# Patient Record
Sex: Male | Born: 1950
Health system: Southern US, Community
[De-identification: ages and names within clinical notes are randomized; demographics above are authoritative.]

## PROBLEM LIST (undated history)

## (undated) DIAGNOSIS — K219 Gastro-esophageal reflux disease without esophagitis: Secondary | ICD-10-CM

## (undated) DIAGNOSIS — I2510 Atherosclerotic heart disease of native coronary artery without angina pectoris: Secondary | ICD-10-CM

## (undated) DIAGNOSIS — I517 Cardiomegaly: Secondary | ICD-10-CM

## (undated) DIAGNOSIS — I5020 Unspecified systolic (congestive) heart failure: Secondary | ICD-10-CM

## (undated) DIAGNOSIS — J810 Acute pulmonary edema: Secondary | ICD-10-CM

## (undated) DIAGNOSIS — I10 Essential (primary) hypertension: Secondary | ICD-10-CM

## (undated) HISTORY — DX: Gastro-esophageal reflux disease without esophagitis: K21.9

## (undated) HISTORY — PX: CHOLECYSTECTOMY: SHX55

## (undated) HISTORY — DX: Unspecified systolic (congestive) heart failure: I50.20

## (undated) HISTORY — DX: Atherosclerotic heart disease of native coronary artery without angina pectoris: I25.10

## (undated) HISTORY — PX: DENTAL SURGERY: SHX609

## (undated) HISTORY — DX: Cardiomegaly: I51.7

## (undated) HISTORY — DX: Acute pulmonary edema: J81.0

---

## 2001-01-01 ENCOUNTER — Emergency Department (HOSPITAL_COMMUNITY): Admission: EM | Admit: 2001-01-01 | Discharge: 2001-01-01 | Payer: Self-pay | Admitting: Emergency Medicine

## 2001-09-18 ENCOUNTER — Emergency Department (HOSPITAL_COMMUNITY): Admission: EM | Admit: 2001-09-18 | Discharge: 2001-09-18 | Payer: Self-pay | Admitting: Emergency Medicine

## 2004-05-07 HISTORY — PX: CHOLECYSTECTOMY, LAPAROSCOPIC: SHX56

## 2004-10-15 ENCOUNTER — Emergency Department (HOSPITAL_COMMUNITY): Admission: EM | Admit: 2004-10-15 | Discharge: 2004-10-15 | Payer: Self-pay | Admitting: Emergency Medicine

## 2005-01-05 ENCOUNTER — Ambulatory Visit (HOSPITAL_COMMUNITY): Admission: RE | Admit: 2005-01-05 | Discharge: 2005-01-06 | Payer: Self-pay | Admitting: General Surgery

## 2005-01-05 ENCOUNTER — Encounter (INDEPENDENT_AMBULATORY_CARE_PROVIDER_SITE_OTHER): Payer: Self-pay | Admitting: Specialist

## 2007-08-05 ENCOUNTER — Emergency Department (HOSPITAL_COMMUNITY): Admission: EM | Admit: 2007-08-05 | Discharge: 2007-08-05 | Payer: Self-pay | Admitting: Emergency Medicine

## 2007-08-29 ENCOUNTER — Emergency Department (HOSPITAL_COMMUNITY): Admission: EM | Admit: 2007-08-29 | Discharge: 2007-08-29 | Payer: Self-pay | Admitting: Emergency Medicine

## 2010-09-22 NOTE — Op Note (Signed)
NAMESOURISH, ALLENDER            ACCOUNT NO.:  [id number]   MEDICAL RECORD NO.:  [id number]          PATIENT TYPE:  AMB   LOCATION:  DAY                          FACILITY:  [facility]   PHYSICIAN:  Sharlet Salina T. Hoxworth, M.D.DATE OF BIRTH:  May 17, 1950   DATE OF PROCEDURE:  01/05/2005  DATE OF DISCHARGE:                                 OPERATIVE REPORT   PREOPERATIVE DIAGNOSIS:  Cholelithiasis and cholecystitis.   POSTOPERATIVE DIAGNOSIS:  Cholelithiasis and cholecystitis.   PROCEDURE:  Laparoscopic cholecystectomy with intraoperative cholangiogram.   SURGEON:  Sharlet Salina T. Hoxworth, M.D.   ASSISTANT:  Leonie Man, M.D.   ANESTHESIA:  General.   BRIEF HISTORY:  Mr. Kinney is a 60 year old male who presents with  repeated episodes of epigastric abdominal pain. He has had a workup  including a gallbladder ultrasound showing multiple gallstones with a  thickened gallbladder wall. He is felt to have significant cholecystitis and  cholelithiasis and laparoscopic cholecystectomy with cholangiogram has been  recommended and accepted. The nature of the procedure, indications, risks of  bleeding, infection, bile leak and bile duct injury were discussed and  understood preoperatively. He is now brought to the operating room for this  procedure.   DESCRIPTION OF PROCEDURE:  The patient was brought to the operating room,  placed in supine position on the operating table and general endotracheal  anesthesia was induced. The abdomen was widely sterilely prepped and draped.  PAS were in place. He received preoperative antibiotics. Correct patient and  procedure were verified. Local anesthesia was used to infiltrate the trocar  sites. Through a 1 cm umbilical incision, the fascia was divided for 1 cm,  the peritoneum entered under direct vision and through a mattress suture of  #0 Vicryl, the Hasson trocar placed and pneumoperitoneum established. The  gallbladder was visualized and was very  thickened chronically and somewhat  subacutely inflamed. Under direct vision, a 10 mm trocar was placed in the  subxiphoid area and two 5 mm trocars along the right subcostal margin. The  gallbladder was grasped and elevated over the liver. Some filmy adhesions of  duodenum and omentum were taken down. The fundus was very thick but was able  to be grasped and retracted laterally and fibrofatty tissue stripped down  off the neck of the gallbladder toward the porta hepatis. The cystic artery  was clearly identified coursing up on the gallbladder wall and was divided  between two proximal and one distal clip. The distal gallbladder was  thoroughly dissected and the cystic duct identified and dissected  circumferentially over about a centimeter and a half. The gallbladder was  then clipped at the junction with the cystic duct and operative  cholangiogram obtained through the cystic duct. This showed good filling of  a normal common bile duct and intrahepatic ducts with free flow into the  duodenum and no filling defects. Following this, the cystic duct was triply  clipped and divided. The gallbladder was then dissected free from its bed  using hook cautery. There was a significant chronic inflammation and the  gallbladder was opened and multiple stones were extracted. Dissection was  tedious but the gallbladder was eventually detached from the liver, placed  in an EndoCatch bag and brought out through the umbilicus. The gallbladder  bed was cauterized and hemostasis obtained. Due to the severe chronic  inflammation, I left a closed suction drain in the subphrenic space. Trocars  removed, all CO2 evacuated and  the mattress sutures secured at the umbilicus. The skin incisions were  closed with interrupted subcuticular Monocryl and Steri-Strips. Sponge,  needle and instrument counts were correct. Dry sterile dressings were  applied and the patient taken to recovery in good condition.       Lorne Skeens. Hoxworth, M.D.  Electronically Signed     BTH/MEDQ  D:  01/05/2005  T:  01/05/2005  Job:  [id number]

## 2011-01-30 LAB — POCT I-STAT, CHEM 8
BUN: 17
Calcium, Ion: 1.17
Chloride: 103
Glucose, Bld: 87
HCT: 40
TCO2: 26

## 2011-01-30 LAB — CBC
HCT: 40.1
Platelets: 235
RBC: 5.01
RDW: 14.4
WBC: 4.8

## 2011-01-30 LAB — URINALYSIS, ROUTINE W REFLEX MICROSCOPIC
Bilirubin Urine: NEGATIVE
Hgb urine dipstick: NEGATIVE
Ketones, ur: NEGATIVE
Nitrite: NEGATIVE
Protein, ur: 30 — AB
Urobilinogen, UA: 2 — ABNORMAL HIGH

## 2011-01-30 LAB — DIFFERENTIAL
Eosinophils Absolute: 0
Monocytes Absolute: 0.5

## 2011-01-30 LAB — POCT CARDIAC MARKERS
Myoglobin, poc: 209
Operator id: 288831

## 2011-10-04 ENCOUNTER — Emergency Department (HOSPITAL_COMMUNITY): Payer: Medicaid Other

## 2011-10-04 ENCOUNTER — Encounter (HOSPITAL_COMMUNITY): Payer: Self-pay | Admitting: Emergency Medicine

## 2011-10-04 ENCOUNTER — Inpatient Hospital Stay (HOSPITAL_COMMUNITY)
Admission: AD | Admit: 2011-10-04 | Discharge: 2011-10-09 | DRG: 287 | Disposition: A | Discharge status: Home / Self Care | Payer: Medicaid Other | Attending: Cardiovascular Disease | Admitting: Cardiovascular Disease

## 2011-10-04 DIAGNOSIS — I214 Non-ST elevation (NSTEMI) myocardial infarction: Secondary | ICD-10-CM

## 2011-10-04 DIAGNOSIS — I517 Cardiomegaly: Secondary | ICD-10-CM

## 2011-10-04 DIAGNOSIS — R110 Nausea: Secondary | ICD-10-CM

## 2011-10-04 DIAGNOSIS — Z823 Family history of stroke: Secondary | ICD-10-CM

## 2011-10-04 DIAGNOSIS — J810 Acute pulmonary edema: Secondary | ICD-10-CM

## 2011-10-04 DIAGNOSIS — E119 Type 2 diabetes mellitus without complications: Secondary | ICD-10-CM | POA: Diagnosis present

## 2011-10-04 DIAGNOSIS — Z87891 Personal history of nicotine dependence: Secondary | ICD-10-CM

## 2011-10-04 DIAGNOSIS — I428 Other cardiomyopathies: Secondary | ICD-10-CM | POA: Diagnosis present

## 2011-10-04 DIAGNOSIS — Z91199 Patient's noncompliance with other medical treatment and regimen due to unspecified reason: Secondary | ICD-10-CM

## 2011-10-04 DIAGNOSIS — K219 Gastro-esophageal reflux disease without esophagitis: Secondary | ICD-10-CM | POA: Diagnosis present

## 2011-10-04 DIAGNOSIS — I169 Hypertensive crisis, unspecified: Secondary | ICD-10-CM

## 2011-10-04 DIAGNOSIS — I10 Essential (primary) hypertension: Secondary | ICD-10-CM | POA: Diagnosis present

## 2011-10-04 DIAGNOSIS — I429 Cardiomyopathy, unspecified: Secondary | ICD-10-CM | POA: Diagnosis present

## 2011-10-04 DIAGNOSIS — R072 Precordial pain: Secondary | ICD-10-CM

## 2011-10-04 DIAGNOSIS — Z8249 Family history of ischemic heart disease and other diseases of the circulatory system: Secondary | ICD-10-CM

## 2011-10-04 DIAGNOSIS — I509 Heart failure, unspecified: Principal | ICD-10-CM | POA: Diagnosis present

## 2011-10-04 DIAGNOSIS — R079 Chest pain, unspecified: Secondary | ICD-10-CM | POA: Diagnosis present

## 2011-10-04 DIAGNOSIS — Z9089 Acquired absence of other organs: Secondary | ICD-10-CM

## 2011-10-04 DIAGNOSIS — Z9119 Patient's noncompliance with other medical treatment and regimen: Secondary | ICD-10-CM

## 2011-10-04 DIAGNOSIS — Z841 Family history of disorders of kidney and ureter: Secondary | ICD-10-CM

## 2011-10-04 HISTORY — DX: Acute pulmonary edema: J81.0

## 2011-10-04 HISTORY — DX: Essential (primary) hypertension: I10

## 2011-10-04 HISTORY — DX: Gastro-esophageal reflux disease without esophagitis: K21.9

## 2011-10-04 HISTORY — DX: Cardiomegaly: I51.7

## 2011-10-04 LAB — CARDIAC PANEL(CRET KIN+CKTOT+MB+TROPI)
CK, MB: 6.8 ng/mL (ref 0.3–4.0)
CK, MB: 5.8 ng/mL — ABNORMAL HIGH (ref 0.3–4.0)
Relative Index: 2.8 — ABNORMAL HIGH (ref 0.0–2.5)
Relative Index: 2.7 — ABNORMAL HIGH (ref 0.0–2.5)
Total CK: 243 U/L — ABNORMAL HIGH (ref 7–232)
Total CK: 217 U/L (ref 7–232)
Troponin I: <0.3 ng/mL (ref <0.30)
Troponin I: <0.3 ng/mL (ref <0.30)

## 2011-10-04 LAB — HEPATIC FUNCTION PANEL
ALT: 31 U/L (ref 0–53)
AST: 43 U/L — ABNORMAL HIGH (ref 0–37)
Total Protein: 7.3 g/dL (ref 6.0–8.3)

## 2011-10-04 LAB — POCT I-STAT TROPONIN I

## 2011-10-04 LAB — CBC
MCH: 26.1 pg (ref 26.0–34.0)
MCHC: 34.2 g/dL (ref 30.0–36.0)
MCV: 76.5 fL — ABNORMAL LOW (ref 78.0–100.0)
Platelets: 225 10*3/uL (ref 150–400)
WBC: 5.9 10*3/uL (ref 4.0–10.5)

## 2011-10-04 LAB — COMPREHENSIVE METABOLIC PANEL
ALT: 30 U/L (ref 0–53)
AST: 42 U/L — ABNORMAL HIGH (ref 0–37)
CO2: 26 mEq/L (ref 19–32)
Calcium: 9.1 mg/dL (ref 8.4–10.5)
Chloride: 100 mEq/L (ref 96–112)
Creatinine, Ser: 1.17 mg/dL (ref 0.50–1.35)
Glucose, Bld: 111 mg/dL — ABNORMAL HIGH (ref 70–99)
Total Protein: 7.6 g/dL (ref 6.0–8.3)

## 2011-10-04 MED ORDER — FUROSEMIDE 10 MG/ML IJ SOLN
80.0000 mg | Freq: Once | INTRAMUSCULAR | Status: AC
  Administered 2011-10-04: 80 mg via INTRAVENOUS
  Filled 2011-10-04: qty 8

## 2011-10-04 MED ORDER — PANTOPRAZOLE SODIUM 40 MG PO TBEC
DELAYED_RELEASE_TABLET | ORAL | Status: AC
  Filled 2011-10-04: qty 1

## 2011-10-04 MED ORDER — MORPHINE SULFATE 2 MG/ML IJ SOLN: 1.0000 mg | INTRAMUSCULAR | Status: DC | PRN

## 2011-10-04 MED ORDER — ASPIRIN EC 81 MG PO TBEC
81.0000 mg | DELAYED_RELEASE_TABLET | Freq: Every day | ORAL | Status: DC
  Administered 2011-10-05: 81 mg via ORAL
  Filled 2011-10-04: qty 1

## 2011-10-04 MED ORDER — SODIUM CHLORIDE 0.9 % IJ SOLN
3.0000 mL | Freq: Two times a day (BID) | INTRAMUSCULAR | Status: DC
  Administered 2011-10-04 – 2011-10-09 (×10): 3 mL via INTRAVENOUS

## 2011-10-04 MED ORDER — ALPRAZOLAM 0.25 MG PO TABS: 0.2500 mg | ORAL_TABLET | Freq: Three times a day (TID) | ORAL | Status: DC | PRN

## 2011-10-04 MED ORDER — LISINOPRIL 20 MG PO TABS
20.0000 mg | ORAL_TABLET | Freq: Every day | ORAL | Status: DC
  Administered 2011-10-04 – 2011-10-07 (×4): 20 mg via ORAL
  Filled 2011-10-04 (×4): qty 1

## 2011-10-04 MED ORDER — ONDANSETRON HCL 4 MG/2ML IJ SOLN
INTRAMUSCULAR | Status: AC
  Administered 2011-10-04: 4 mg via INTRAVENOUS
  Filled 2011-10-04: qty 2

## 2011-10-04 MED ORDER — ZOLPIDEM TARTRATE 5 MG PO TABS: 10.0000 mg | ORAL_TABLET | Freq: Every evening | ORAL | Status: DC | PRN

## 2011-10-04 MED ORDER — HEPARIN SODIUM (PORCINE) 5000 UNIT/ML IJ SOLN
INTRAMUSCULAR | Status: AC
  Administered 2011-10-04: 5000 [IU] via SUBCUTANEOUS
  Filled 2011-10-04: qty 1

## 2011-10-04 MED ORDER — FUROSEMIDE 40 MG PO TABS
40.0000 mg | ORAL_TABLET | Freq: Two times a day (BID) | ORAL | Status: DC
  Administered 2011-10-04 – 2011-10-09 (×10): 40 mg via ORAL
  Filled 2011-10-04 (×2): qty 1
  Filled 2011-10-04 (×4): qty 2
  Filled 2011-10-04 (×3): qty 1
  Filled 2011-10-04 (×2): qty 2
  Filled 2011-10-04: qty 1

## 2011-10-04 MED ORDER — ASPIRIN 81 MG PO CHEW
324.0000 mg | CHEWABLE_TABLET | Freq: Once | ORAL | Status: AC
  Administered 2011-10-04: 324 mg via ORAL
  Filled 2011-10-04: qty 4

## 2011-10-04 MED ORDER — ACETAMINOPHEN 325 MG PO TABS: 650.0000 mg | ORAL_TABLET | Freq: Four times a day (QID) | ORAL | Status: DC | PRN

## 2011-10-04 MED ORDER — NITROGLYCERIN IN D5W 200-5 MCG/ML-% IV SOLN
5.0000 ug/min | INTRAVENOUS | Status: DC
  Administered 2011-10-04: 5 ug/min via INTRAVENOUS
  Administered 2011-10-05: 80 ug/min via INTRAVENOUS
  Filled 2011-10-04 (×2): qty 250

## 2011-10-04 MED ORDER — CLONIDINE HCL 0.1 MG PO TABS
0.1000 mg | ORAL_TABLET | Freq: Once | ORAL | Status: AC
  Administered 2011-10-05: 0.1 mg via ORAL
  Filled 2011-10-04: qty 1

## 2011-10-04 MED ORDER — HYDRALAZINE HCL 20 MG/ML IJ SOLN
10.0000 mg | INTRAMUSCULAR | Status: DC | PRN
  Filled 2011-10-04: qty 0.5

## 2011-10-04 MED ORDER — TRAMADOL HCL 50 MG PO TABS
50.0000 mg | ORAL_TABLET | Freq: Four times a day (QID) | ORAL | Status: DC | PRN
  Administered 2011-10-05: 50 mg via ORAL
  Filled 2011-10-04: qty 1

## 2011-10-04 MED ORDER — SODIUM CHLORIDE 0.9 % IV SOLN
INTRAVENOUS | Status: DC
  Administered 2011-10-04: 10 mL/h via INTRAVENOUS

## 2011-10-04 MED ORDER — ONDANSETRON HCL 4 MG/2ML IJ SOLN
INTRAMUSCULAR | Status: AC
  Administered 2011-10-04: 05:00:00 via INTRAVENOUS
  Filled 2011-10-04: qty 2

## 2011-10-04 MED ORDER — HEPARIN SODIUM (PORCINE) 5000 UNIT/ML IJ SOLN
5000.0000 [IU] | Freq: Three times a day (TID) | INTRAMUSCULAR | Status: DC
  Administered 2011-10-04 – 2011-10-09 (×16): 5000 [IU] via SUBCUTANEOUS
  Filled 2011-10-04 (×18): qty 1

## 2011-10-04 MED ORDER — PANTOPRAZOLE SODIUM 40 MG PO TBEC
40.0000 mg | DELAYED_RELEASE_TABLET | Freq: Two times a day (BID) | ORAL | Status: DC
  Administered 2011-10-04 – 2011-10-09 (×12): 40 mg via ORAL
  Filled 2011-10-04 (×11): qty 1

## 2011-10-04 MED ORDER — METOPROLOL TARTRATE 12.5 MG HALF TABLET
12.5000 mg | ORAL_TABLET | Freq: Two times a day (BID) | ORAL | Status: DC
  Administered 2011-10-04 – 2011-10-05 (×4): 12.5 mg via ORAL
  Filled 2011-10-04 (×6): qty 1

## 2011-10-04 MED ORDER — POTASSIUM CHLORIDE CRYS ER 20 MEQ PO TBCR
40.0000 meq | EXTENDED_RELEASE_TABLET | Freq: Once | ORAL | Status: AC
  Administered 2011-10-04: 40 meq via ORAL
  Filled 2011-10-04: qty 2

## 2011-10-04 NOTE — Care Management Note (Addendum)
    Page 1 of 1   10/05/2011     10:08:34 AM   CARE MANAGEMENT NOTE 10/05/2011  Patient:  Thomas Bullock, Thomas Bullock   Account Number:  [id number]  Date Initiated:  10/04/2011  Documentation initiated by:  Junius Creamer  Subjective/Objective Assessment:   adm w htn     Action/Plan:   lives w friend, no ins listed, pcp dr Gaspar Garbe little   Anticipated DC Date:  10/06/2011   Anticipated DC Plan:  HOME/SELF CARE  In-house referral  Financial Counselor      DC Planning Services  CM consult  [facility]igent [facility]      Choice offered to / List presented to:             Status of service:   Medicare Important Message given?   (If response is "NO", the following Medicare IM given date fields will be blank) Date Medicare IM given:   Date Additional Medicare IM given:    Discharge Disposition:    Per UR Regulation:  Reviewed for med. necessity/level of care/duration of stay  If discussed at Long Length of Stay Meetings, dates discussed:    Comments:  5/31 10:00a debbie Jaydn Moscato rn,bsn spoke w pt. got contact number and explained that [facility]. [facility] will mail packet and pt should fill out and mail back then [facility] will call and sched appt. pt's contact is joann king [phone] or [phone].  5/30 13:30p debbie Maryclaire Stoecker rn,bsn [phone] will follow and assist as pt progresses.spoke w pt and sister. he does not have ins. gave pt inform on [facility]. gave pt community assist card. will ask md to put on as many $4.00 meds as possible, [facility] will see. will ck w internal med and fam pract clinics to see if either taking new pt's.

## 2011-10-04 NOTE — H&P (Signed)
History and Physical  Thomas Bullock ZOX:[id number] DOB: December 03, 1950 DOA: 10/04/2011  Referring physician: PCP: Judene Companion, MD, MD   Chief Complaint: SOB  HPI:  61 yr old AAM started to have some SOB last pm.  Has been having some increasing SOB actually from 5/28.  Has had CP as well and n/v-vomited about 3 x since Tuesday. Did eat but has had less appetite.  CP localized in the center of the chest-does have some radiating CP into arm and neck-but states that this is more of a muscular pain which he has had for a long time.  Does state he has felt a sour sensation in his abdomen with radiation from the epigastrium up to the throat, which resolved when he was given some medication here, was given Ziofran in the EMS truck which seemed to help  Prior to arrival at ED, Blood pressure was 210/170 Has not had these issues before.  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  + cough but no fever or chills-states has had some sputum as well.  No wheeze, no allergic h/o Last saw a MD maybe about 2008.  Non-compliant on medications 2/2 to cost-last time took medicine was 2009.   Patient is a relatively poor historian and it was somewhat difficult to determine the history, which was supplemented with Sister's/Duaghter's input      Chart Review:  Seen for syncope and Dental issues in the past, no other real PMHx from Epic  Review of Systems:  Occasional blurred and double vision, no headaches, some abd pain, seems to have been more abd than CP, no dizzyness, no lightheadedness, no dark stool or tarry stool, no vomiting blood, no wekaness like a stroke, but L shoulder weakness which is chronic, no diarrhoea, no suspicious food, no sicik contacts that he can really recall-thinks he got sick at his job in [employer]  Past Medical History  Diagnosis Date  . Hypertension     History reviewed. No pertinent past surgical history.  Social History:  reports that he has never smoked. He does not have any smokeless tobacco history on file. He reports that he does not drink alcohol. His drug history not on file.  No Known Allergies  History reviewed. No pertinent family history.   Prior to Admission medications   Medication Sig Start Date End Date Taking? Authorizing Provider  ibuprofen (ADVIL,MOTRIN) 200 MG tablet Take 400 mg by mouth every 6 (six) hours as needed. For pain   Yes Historical Provider, MD   Physical Exam: Filed Vitals:   10/04/11 0615 10/04/11 0630 10/04/11 0645 10/04/11 0700  BP: 197/149 209/139 186/139 194/147  Pulse: 106 98 97 101  Temp:      TempSrc:      Resp: 26 24 30 30   SpO2: 100% 100% 100% 100%     General:  Alert pleasant AAM in NAD  Eyes: no pallor, no ict, fundi inspected, but disc not visuallised  ENT: soft, no JVD, no bruit  Neck: no bruit  Cardiovascular: s1 s2 tachycardic, RRR  Respiratory: clinically clear, no added sound, no tvr, no tvf  Abdomen: soft, nt,nt, no HSM  Skin: no LE edema  Musculoskeletal: moves all 4 limbs equally  Psychiatric: euthymic  Neurologic: grossly intact  Labs on Admission:  Basic Metabolic Panel:  Lab 10/04/11 0542  NA 138  K 3.3*  CL 100  CO2 26  GLUCOSE 111*  BUN 17  CREATININE 1.17  CALCIUM 9.1  MG --  PHOS --    Liver Function Tests:  Lab 10/04/11 0542  AST 42*  ALT 30  ALKPHOS 110  BILITOT 0.7  PROT 7.6  ALBUMIN 3.6   No results found for this basename: LIPASE:5,AMYLASE:5 in the last 168 hours No results found for this basename: AMMONIA:5 in the last 168 hours  CBC:  Lab 10/04/11 0542  WBC 5.9  NEUTROABS --  HGB 13.7  HCT 40.1  MCV 76.5*  PLT 225    Cardiac Enzymes: No results found for this basename: CKTOTAL:5,CKMB:5,CKMBINDEX:5,TROPONINI:5 in the last 168  hours  Troponin [facility] Test)  [facility] 10/04/11 0635  TROPIPOC 0.06    BNP (last 3 results)  Basename 10/04/11 0542  PROBNP 5347.0*    CBG: No results found for this basename: GLUCAP:5 in the last 168 hours   Radiological Exams on Admission: Dg Chest Portable 1 View  10/04/2011  *RADIOLOGY REPORT*  Clinical Data: Shortness of breath, abdominal discomfort, hypertension.  Nausea.  PORTABLE CHEST - 1 VIEW  Comparison: 08/29/2007  Findings: Shallow inspiration.  Cardiac enlargement with mild pulmonary vascular congestion, new since the previous study.  No significant edema.  No focal consolidation.  No blunting of costophrenic angles.  No pneumothorax.  IMPRESSION: Interval development of cardiac enlargement with mild pulmonary vascular congestion.  Original Report Authenticated By: Marlon Pel, M.D.    EKG: Independently reviewed. sinuc tachycardia, PR=0.08, QRS axis=90 degrees, Diffuse ST -elevations in leads V2, v3,v4 with >49mm depressions in v5-6 with changed t wave morphology from 09/03/2007 ekg   Active Problems:  * No active hospital problems. *     Assessment/Plan 1. Accelerated HTN-Agree with Nitroglycerin Gtt for right now. We will also add to his medications when necessary metoprolol 12.5 twice a day and may titrate upwards. We'll also place and parameters for hydralazine with blood pressures over 200/100 necessitating 5-10 mg hydralazine. 2. EKG changes-patient has no known coronary artery disease and I have ordered a stat echocardiogram to determine if there is any systolic dysfunction that might be driving this elevation of his blood pressure-I would expect him to be hypotensive however without any overt cardiac issue. Patient will get dedicated cardiac enzymes done as well. Given that he has sinus tachycardia which may also cause some of these EKG changes, I have asked cardiology to see the patient and determine if he needs further ischemic workup. 3. ?  Reflux versus gastritis versus viral gastroenteritis-agree with continuing Zofran, will start a PPI as well. As he has no temperature or fever currently will hold off on any other workup. His WBC is normal 4. New decompensated CHF [unknown systolic or diastolic]-we'll get an echo. She had a proBNP of 5000 and chest x-ray findings of cardiac enlargement pulmonary vascular congestion. Patient was given one dose of IV Lasix 80 mg stat and this will be continued twice daily. Strict ins and outs. Foley catheter placed.  PAtient has a new oxygen requirement probably from the CHF and will keep on O2 5. ? New onset diabetes mellitus-we will get an A1c to determine the case-Sliding scale orders on hold till we get consecutve blood sugars to determine if needed. 6. CKD STAGE I-B. and creatinine 17/1.17-this seems to be his baseline. Will hold nephrotoxins as much as possible and cautiously diuresis 7. Noncompliance with medication-patient requesting  to see a Child psychotherapist. Explained to him that most medications are on the forearm wrist and he probably will benefit from having his pharmacy help him with the same  Code Status: Full Family Communication: Spoke with his sister who is health care power of attorney and daughter at bedside Disposition Plan: Inpatient-admit to [facility]. I've consulted [facility] who probably will take over his case given majority of his issues of cardiac-much appreciated.  Pleas Koch, MD  [facility] Pager [phone]  If 8PM-8AM, please contact floor/night-coverage at [email or web address], password [facility] 10/04/2011, 7:20 AM

## 2011-10-04 NOTE — ED Notes (Signed)
Paged and spoke with admitting MD Dr. Mahala Menghini and Cardiology PA, Franky Macho to inform of CKMB 6.8.

## 2011-10-04 NOTE — ED Notes (Signed)
Attempt to call report, floor unable to take at this time.  

## 2011-10-04 NOTE — ED Notes (Signed)
Patient feeling nauseated.  MD notified.  New orders per Dr Dierdre Highman.

## 2011-10-04 NOTE — Progress Notes (Signed)
Echo shows severe global LVD. Will add ACE. He will need cath, will discuss timing with MD.  Corine Shelter PA-C 10/04/2011 4:08 PM

## 2011-10-04 NOTE — ED Notes (Signed)
Pt provided urinal and sprite. Belongings placed in bag at bedside. Pt holding on to wallet at this time.

## 2011-10-04 NOTE — ED Notes (Addendum)
Echo at bedside at this time. Sister Thomas Bullock [phone]. Thomas Bullock [facility]) [phone].

## 2011-10-04 NOTE — ED Notes (Signed)
Patient with increasing shortness of breath over last week.  Patient with rales bilat, congestion.  Patient denies any chest pain.  Patient states that he has not been taking his HTN meds for two years.

## 2011-10-04 NOTE — ED Notes (Addendum)
Patient with nausea, vomiting, epigastric pains and shortness of breath, on and off for one week.  Patient also having bilat shoulder pains with this and shortness of breath when lying down.  Patient from home via EMS.  EMS gave 4mg  of Zofran enroute to ED.

## 2011-10-04 NOTE — Consult Note (Addendum)
Reason for Consult: SOB, chest pain  Requesting Physician: Claybon Jabs  HPI: This is a 61 y.o. male with a past medical history significant for HTN. He had seen Dr Clarene Duke in 2009. No records are available. 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 He now works at [employer]" in [employer] but has no insurance.  He basically has been without medicines since 2009.  He has not been admitted or had any surgeries since we last saw him. He presented to the ER today with complaints of SOB, orthopnea, and epigastric chest pain that started last night . In the ER his B/P was over 200 systolic. His CXR shows CHF and his BNP is 5347. His EKG shows NSR and new LVH with NSST changes compared with 2009. He has has some improvement with IV NTG and IV Lasix. He is on SQ Heparin.  PMHx:  Past Medical History  Diagnosis Date  . Hypertension    Past Surgical History  Procedure Date  . Dental surgery   . Cholecystectomy, laparoscopic 2006    FAMHx: Family History  Problem Relation Age of Onset  . Kidney disease Mother     54  . Heart attack Father     2007-died of Heart attack @ age 16  . Stroke Mother     SOCHx:  reports that he quit smoking about 38 years ago. His smoking use included Cigarettes. He does not have any smokeless tobacco history on file. He reports that he drinks about 2 ounces of alcohol per week. His drug history not on file.  ALLERGIES: No Known Allergies  ROS: A comprehensive review of systems was negative. No GI bleeding. No syncope.  HOME MEDICATIONS: Advil  HOSPITAL MEDICATIONS: I have reviewed the patient's current medications.  VITALS: Blood pressure 183/130, pulse 82, temperature 98.1 F (36.7 C), temperature source Oral, resp. rate 18, SpO2 96.00%.  PHYSICAL EXAM: General appearance: alert, cooperative, appears stated age and no distress Neck: no carotid bruit, no JVD, supple, symmetrical, trachea  midline and thyroid not enlarged, symmetric, no tenderness/mass/nodules Lungs: bi basilar rales 1/3 way up Heart: regular rate and rhythm, no obvious murmur, +S4 Abdomen: soft, non-tender; bowel sounds normal; no masses,  no organomegaly Extremities: 1+ pre tibial pitting edema Pulses: 2+ and symmetric Skin: Skin color, texture, turgor normal. No rashes or lesions Neurologic: Grossly normal  LABS: Results for orders placed during the hospital encounter of 10/04/11 (from the past 48 hour(s))  CBC     Status: Abnormal   Collection Time   10/04/11  5:42 AM      Component Value Range Comment   WBC 5.9  4.0 - 10.5 (K/uL)    RBC 5.24  4.22 - 5.81 (MIL/uL)    Hemoglobin 13.7  13.0 - 17.0 (g/dL)    HCT 16.1  09.6 - 04.5 (%)    MCV 76.5 (*) 78.0 - 100.0 (fL)    MCH 26.1  26.0 - 34.0 (pg)    MCHC 34.2  30.0 - 36.0 (g/dL)    RDW 40.9  81.1 - 91.4 (%)    Platelets 225  150 - 400 (K/uL)   COMPREHENSIVE METABOLIC PANEL     Status: Abnormal   Collection Time   10/04/11  5:42 AM      Component Value Range Comment   Sodium 138  135 - 145 (mEq/L)    Potassium 3.3 (*) 3.5 - 5.1 (mEq/L)    Chloride  100  96 - 112 (mEq/L)    CO2 26  19 - 32 (mEq/L)    Glucose, Bld 111 (*) 70 - 99 (mg/dL)    BUN 17  6 - 23 (mg/dL)    Creatinine, Ser 4.09  0.50 - 1.35 (mg/dL)    Calcium 9.1  8.4 - 10.5 (mg/dL)    Total Protein 7.6  6.0 - 8.3 (g/dL)    Albumin 3.6  3.5 - 5.2 (g/dL)    AST 42 (*) 0 - 37 (U/L)    ALT 30  0 - 53 (U/L)    Alkaline Phosphatase 110  39 - 117 (U/L)    Total Bilirubin 0.7  0.3 - 1.2 (mg/dL)    GFR calc non Af Amer 66 (*) >90 (mL/min)    GFR calc Af Amer 77 (*) >90 (mL/min)   PRO B NATRIURETIC PEPTIDE     Status: Abnormal   Collection Time   10/04/11  5:42 AM      Component Value Range Comment   Pro B Natriuretic peptide (BNP) 5347.0 (*) 0 - 125 (pg/mL)   POCT I-STAT TROPONIN I     Status: Normal   Collection Time   10/04/11  6:35 AM      Component Value Range Comment   Troponin i,  poc 0.06  0.00 - 0.08 (ng/mL)    Comment 3              IMAGING: Dg Chest Portable 1 View  10/04/2011  *RADIOLOGY REPORT*  Clinical Data: Shortness of breath, abdominal discomfort, hypertension.  Nausea.  PORTABLE CHEST - 1 VIEW  Comparison: 08/29/2007  Findings: Shallow inspiration.  Cardiac enlargement with mild pulmonary vascular congestion, new since the previous study.  No significant edema.  No focal consolidation.  No blunting of costophrenic angles.  No pneumothorax.  IMPRESSION: Interval development of cardiac enlargement with mild pulmonary vascular congestion.  Original Report Authenticated By: Marlon Pel, M.D.    IMPRESSION: Principal Problem:  *Acute pulmonary edema Active Problems:  HTN (hypertension), malignant  Non compliance with medical treatment because of financial issues  Chest pain, r/o MI  LVH (left ventricular hypertrophy)  Gastroesophageal reflux symptoms   RECOMMENDATION: Agree with current work up and medications including SQ Heparin vs IV in the setting of uncontrolled HTN.  We will be glad to take on our service. He may need cath pending Troponin once his B/P is controlled. MD to see.  Time Spent Directly with Patient: 45 minutes  KILROY,LUKE K 10/04/2011, 9:34 AM   I have seen and examined the patient along with Abelino Derrick, PA.  I have reviewed the chart, notes and new data.  I agree with PA's note.  Key new complaints: dyspnea is improving, but he is still orthopneic. Key examination changes: BP coming down, but still quite high (162/103) Key new findings / data: normal cardiac enzymes; echo shows dilated LV with severe global hypokinesis.  PLAN: Differential diagnosis is "burned-out" HTNive heart disease (clinically more likely and better prognosis) and multivessel CAD (statistically more likely and worse prognosis. Needs coronary angio, but first needs to be able to lie flat and have better BP control.  Qualifies for "malignant  HTN".  Thurmon Fair, MD, [facility] [phone] 10/04/2011, 5:43 PM

## 2011-10-04 NOTE — ED Provider Notes (Signed)
History     CSN: [id number]  Arrival date & time 10/04/11  0531   First MD Initiated Contact with Patient 10/04/11 0532      Chief Complaint  Patient presents with  . Emesis  . Nausea  . Shortness of Breath    (Consider location/radiation/quality/duration/timing/severity/associated sxs/prior treatment) HPI History provided by patient. His prescribed medications for hypertension but due to loss of job and insurance has not taken in the last 2 years. Tonight developed shortness of breath with some nausea and epigastric discomfort and called EMS. He was found to be hypertensive and tachycardic and transported to the emergency department. He describes pain as sharp and present for about the last week. He thought it was indigestion and took over-the-counter medications without relief. No radiation of pain. No leg pain or swelling. No headache or unilateral weakness. No vomiting. Shortness of breath is worse with lying flat or exertion. No history of similar symptoms in the past. Past Medical History  Diagnosis Date  . Hypertension     History reviewed. No pertinent past surgical history.  History reviewed. No pertinent family history.  History  Substance Use Topics  . Smoking status: Never Smoker   . Smokeless tobacco: Not on file  . Alcohol Use: No      Review of Systems  Constitutional: Negative for fever and chills.  HENT: Negative for neck pain and neck stiffness.   Eyes: Negative for pain.  Respiratory: Positive for shortness of breath. Negative for cough.   Cardiovascular: Positive for chest pain.  Gastrointestinal: Negative for abdominal pain.  Genitourinary: Negative for dysuria.  Musculoskeletal: Negative for back pain.  Skin: Negative for rash.  Neurological: Negative for headaches.  All other systems reviewed and are negative.    Allergies  Review of patient's allergies indicates no known allergies.  Home Medications   Current Outpatient Rx  Name Route  Sig Dispense Refill  . IBUPROFEN 200 MG PO TABS Oral Take 400 mg by mouth every 6 (six) hours as needed. For pain      BP 197/149  Pulse 106  Temp(Src) 98.1 F (36.7 C) (Oral)  Resp 26  SpO2 100%  Physical Exam  Constitutional: He is oriented to person, place, and time. He appears well-developed and well-nourished.  HENT:  Head: Normocephalic and atraumatic.  Eyes: Conjunctivae and EOM are normal. Pupils are equal, round, and reactive to light.  Neck: Trachea normal. Neck supple. No thyromegaly present.  Cardiovascular: Regular rhythm, S1 normal, S2 normal and normal pulses.     No systolic murmur is present   No diastolic murmur is present  Pulses:      Radial pulses are 2+ on the right side, and 2+ on the left side.       Tachycardic and hypertensive  Pulmonary/Chest: He has no wheezes. He has no rhonchi. He exhibits no tenderness.       Mild tachypnea and mildly decreased bilateral breath sounds without obvious rales  Abdominal: Soft. Normal appearance and bowel sounds are normal. There is no tenderness. There is no CVA tenderness and negative Murphy's sign.  Musculoskeletal:       BLE:s Calves nontender, no cords or erythema, negative Homans sign  Neurological: He is alert and oriented to person, place, and time. He has normal strength. No cranial nerve deficit or sensory deficit. GCS eye subscore is 4. GCS verbal subscore is 5. GCS motor subscore is 6.  Skin: Skin is warm and dry. No rash noted. He is not  diaphoretic.  Psychiatric: His speech is normal.       Cooperative and appropriate    ED Course  Procedures (including critical care time)  Results for orders placed during the hospital encounter of 10/04/11  CBC      Component Value Range   WBC 5.9  4.0 - 10.5 (K/uL)   RBC 5.24  4.22 - 5.81 (MIL/uL)   Hemoglobin 13.7  13.0 - 17.0 (g/dL)   HCT 62.1  30.8 - 65.7 (%)   MCV 76.5 (*) 78.0 - 100.0 (fL)   MCH 26.1  26.0 - 34.0 (pg)   MCHC 34.2  30.0 - 36.0 (g/dL)   RDW  84.6  96.2 - 95.2 (%)   Platelets 225  150 - 400 (K/uL)  COMPREHENSIVE METABOLIC PANEL      Component Value Range   Sodium 138  135 - 145 (mEq/L)   Potassium 3.3 (*) 3.5 - 5.1 (mEq/L)   Chloride 100  96 - 112 (mEq/L)   CO2 26  19 - 32 (mEq/L)   Glucose, Bld 111 (*) 70 - 99 (mg/dL)   BUN 17  6 - 23 (mg/dL)   Creatinine, Ser 8.41  0.50 - 1.35 (mg/dL)   Calcium 9.1  8.4 - 32.4 (mg/dL)   Total Protein 7.6  6.0 - 8.3 (g/dL)   Albumin 3.6  3.5 - 5.2 (g/dL)   AST 42 (*) 0 - 37 (U/L)   ALT 30  0 - 53 (U/L)   Alkaline Phosphatase 110  39 - 117 (U/L)   Total Bilirubin 0.7  0.3 - 1.2 (mg/dL)   GFR calc non Af Amer 66 (*) >90 (mL/min)   GFR calc Af Amer 77 (*) >90 (mL/min)  PRO B NATRIURETIC PEPTIDE      Component Value Range   Pro B Natriuretic peptide (BNP) 5347.0 (*) 0 - 125 (pg/mL)  POCT I-STAT TROPONIN I      Component Value Range   Troponin i, poc 0.06  0.00 - 0.08 (ng/mL)   Comment 3            Dg Chest Portable 1 View  10/04/2011  *RADIOLOGY REPORT*  Clinical Data: Shortness of breath, abdominal discomfort, hypertension.  Nausea.  PORTABLE CHEST - 1 VIEW  Comparison: 08/29/2007  Findings: Shallow inspiration.  Cardiac enlargement with mild pulmonary vascular congestion, new since the previous study.  No significant edema.  No focal consolidation.  No blunting of costophrenic angles.  No pneumothorax.  IMPRESSION: Interval development of cardiac enlargement with mild pulmonary vascular congestion.  Original Report Authenticated By: Marlon Pel, M.D.    Date: 10/04/2011  Rate: 113  Rhythm: sinus tachycardia  QRS Axis: normal  Intervals: normal  ST/T Wave abnormalities: nonspecific ST/T changes  Conduction Disutrbances:none  Narrative Interpretation: LVH with lateral ST depressions and T-wave inversion.   Old EKG Reviewed: changes noted    Nitroglycerin drip initiated for clinical fluid overload with severe hypertension. EKG reviewed as above. Labs obtained and reviewed as  above. Medicine consult for admission.  Case discussed with Dr. Toniann Fail at [phone], plan step down ICU admit for NTG drip. BP improving and mild symptomatic improvement.     MDM   61 year old male presenting with shortness of breath with severe hypertension likely causing acute pulmonary edema, started on nitroglycerin drip. Labs and imaging reviewed as above. Plan medical admission        Sunnie Nielsen, MD 10/04/11 [phone]

## 2011-10-04 NOTE — Progress Notes (Signed)
  Echocardiogram 2D Echocardiogram has been performed.  Thomas Bullock 10/04/2011, 9:03 AM

## 2011-10-05 ENCOUNTER — Encounter (HOSPITAL_COMMUNITY)
Admission: AD | Disposition: A | Discharge status: Home / Self Care | Payer: Self-pay | Source: Home / Self Care | Attending: Cardiovascular Disease

## 2011-10-05 HISTORY — PX: LEFT HEART CATHETERIZATION WITH CORONARY ANGIOGRAM: SHX5451

## 2011-10-05 HISTORY — PX: RIGHT HEART CATHETERIZATION: SHX5447

## 2011-10-05 LAB — HEMOGLOBIN A1C
Hgb A1c MFr Bld: 5.4 % (ref <5.7)
Mean Plasma Glucose: 108 mg/dL (ref <117)

## 2011-10-05 LAB — CBC
MCH: 26.3 pg (ref 26.0–34.0)
MCHC: 34.5 g/dL (ref 30.0–36.0)
Platelets: 160 10*3/uL (ref 150–400)
RBC: 4.56 MIL/uL (ref 4.22–5.81)

## 2011-10-05 LAB — POCT I-STAT 3, ART BLOOD GAS (G3+)
Acid-base deficit: 3 mmol/L — ABNORMAL HIGH (ref 0.0–2.0)
Bicarbonate: 23.2 mEq/L (ref 20.0–24.0)
O2 Saturation: 54 %
O2 Saturation: 94 %
TCO2: 25 mmol/L (ref 0–100)
TCO2: 26 mmol/L (ref 0–100)

## 2011-10-05 LAB — BASIC METABOLIC PANEL
BUN: 13 mg/dL (ref 6–23)
CO2: 28 mEq/L (ref 19–32)
Calcium: 8.6 mg/dL (ref 8.4–10.5)
Chloride: 102 mEq/L (ref 96–112)
Creatinine, Ser: 1.3 mg/dL (ref 0.50–1.35)
GFR calc Af Amer: 67 mL/min — ABNORMAL LOW (ref >90)
GFR calc non Af Amer: 58 mL/min — ABNORMAL LOW (ref >90)
Glucose, Bld: 86 mg/dL (ref 70–99)
Potassium: 3.5 mEq/L (ref 3.5–5.1)
Sodium: 138 mEq/L (ref 135–145)

## 2011-10-05 LAB — CARDIAC PANEL(CRET KIN+CKTOT+MB+TROPI)
CK, MB: 4.7 ng/mL — ABNORMAL HIGH (ref 0.3–4.0)
Relative Index: 2.4 (ref 0.0–2.5)
Total CK: 198 U/L (ref 7–232)
Troponin I: <0.3 ng/mL (ref <0.30)

## 2011-10-05 LAB — CREATININE, SERUM: GFR calc Af Amer: 66 mL/min — ABNORMAL LOW (ref >90)

## 2011-10-05 LAB — PROTIME-INR: INR: 1.16 (ref 0.00–1.49)

## 2011-10-05 LAB — GLUCOSE, CAPILLARY: Glucose-Capillary: 80 mg/dL (ref 70–99)

## 2011-10-05 SURGERY — LEFT HEART CATHETERIZATION WITH CORONARY ANGIOGRAM

## 2011-10-05 MED ORDER — ASPIRIN EC 325 MG PO TBEC
325.0000 mg | DELAYED_RELEASE_TABLET | Freq: Every day | ORAL | Status: DC
  Administered 2011-10-06: 325 mg via ORAL
  Filled 2011-10-05 (×2): qty 1

## 2011-10-05 MED ORDER — DIAZEPAM 5 MG PO TABS
5.0000 mg | ORAL_TABLET | ORAL | Status: AC
  Administered 2011-10-05: 5 mg via ORAL
  Filled 2011-10-05: qty 1

## 2011-10-05 MED ORDER — ACETAMINOPHEN 325 MG PO TABS
650.0000 mg | ORAL_TABLET | ORAL | Status: DC | PRN
  Administered 2011-10-07: 650 mg via ORAL
  Filled 2011-10-05: qty 2

## 2011-10-05 MED ORDER — LIDOCAINE HCL (PF) 1 % IJ SOLN
INTRAMUSCULAR | Status: AC
  Filled 2011-10-05: qty 30

## 2011-10-05 MED ORDER — SODIUM CHLORIDE 0.9 % IJ SOLN: 3.0000 mL | INTRAMUSCULAR | Status: DC | PRN

## 2011-10-05 MED ORDER — CLONIDINE HCL ER 0.1 MG PO TB12: 0.1000 mg | ORAL_TABLET | Freq: Two times a day (BID) | ORAL | Status: DC

## 2011-10-05 MED ORDER — FENTANYL CITRATE 0.05 MG/ML IJ SOLN
INTRAMUSCULAR | Status: AC
  Filled 2011-10-05: qty 2

## 2011-10-05 MED ORDER — POTASSIUM CHLORIDE CRYS ER 20 MEQ PO TBCR
40.0000 meq | EXTENDED_RELEASE_TABLET | Freq: Once | ORAL | Status: AC
  Administered 2011-10-05: 40 meq via ORAL
  Filled 2011-10-05: qty 2

## 2011-10-05 MED ORDER — POTASSIUM CHLORIDE CRYS ER 20 MEQ PO TBCR
20.0000 meq | EXTENDED_RELEASE_TABLET | Freq: Every day | ORAL | Status: DC
  Filled 2011-10-05: qty 1

## 2011-10-05 MED ORDER — SODIUM CHLORIDE 0.9 % IV SOLN: 250.0000 mL | INTRAVENOUS | Status: DC | PRN

## 2011-10-05 MED ORDER — NITROGLYCERIN 0.2 MG/ML ON CALL CATH LAB
INTRAVENOUS | Status: AC
  Filled 2011-10-05: qty 1

## 2011-10-05 MED ORDER — SODIUM CHLORIDE 0.45 % IV SOLN
INTRAVENOUS | Status: DC
  Administered 2011-10-05: 09:00:00 via INTRAVENOUS

## 2011-10-05 MED ORDER — MIDAZOLAM HCL 2 MG/2ML IJ SOLN
INTRAMUSCULAR | Status: AC
  Filled 2011-10-05: qty 2

## 2011-10-05 MED ORDER — MORPHINE SULFATE 2 MG/ML IJ SOLN: 2.0000 mg | INTRAMUSCULAR | Status: DC | PRN

## 2011-10-05 MED ORDER — HEPARIN (PORCINE) IN NACL 2-0.9 UNIT/ML-% IJ SOLN
INTRAMUSCULAR | Status: AC
  Filled 2011-10-05: qty 2000

## 2011-10-05 MED ORDER — SODIUM CHLORIDE 0.9 % IV SOLN: INTRAVENOUS | Status: DC

## 2011-10-05 MED ORDER — ONDANSETRON HCL 4 MG/2ML IJ SOLN: 4.0000 mg | Freq: Four times a day (QID) | INTRAMUSCULAR | Status: DC | PRN

## 2011-10-05 MED ORDER — CLONIDINE HCL 0.1 MG PO TABS
0.1000 mg | ORAL_TABLET | Freq: Two times a day (BID) | ORAL | Status: DC
  Administered 2011-10-05 (×2): 0.1 mg via ORAL
  Filled 2011-10-05 (×4): qty 1

## 2011-10-05 MED ORDER — ASPIRIN 81 MG PO CHEW
CHEWABLE_TABLET | ORAL | Status: AC
  Administered 2011-10-05: 324 mg
  Filled 2011-10-05: qty 4

## 2011-10-05 MED ORDER — SODIUM CHLORIDE 0.9 % IJ SOLN: 3.0000 mL | Freq: Two times a day (BID) | INTRAMUSCULAR | Status: DC

## 2011-10-05 NOTE — Progress Notes (Signed)
Subjective:  SOB improved  Objective:  Vital Signs in the last 24 hours: Temp:  [97.9 F (36.6 C)-98.6 F (37 C)] 98.3 F (36.8 C) (05/31 0400) Pulse Rate:  [71-96] 81  (05/31 0700) Resp:  [18-31] 30  (05/31 0700) BP: (130-183)/(83-130) 137/86 mmHg (05/31 0700) SpO2:  [91 %-100 %] 92 % (05/31 0700) Weight:  [77.1 kg (169 lb 15.6 oz)-77.5 kg (170 lb 13.7 oz)] 77.5 kg (170 lb 13.7 oz) (05/31 0500)  Intake/Output from previous day:  Intake/Output Summary (Last 24 hours) at 10/05/11 0805 Last data filed at 10/05/11 0700  Gross per 24 hour  Intake 767.51 ml  Output   3075 ml  Net -2307.49 ml    Physical Exam: General appearance: alert, cooperative and no distress No JVD Lungs: decreased breath sounds, few rhonchi Heart: regular rate and rhythm; 1/6 SEM Abd;  Soft; BS + No edema   Rate: 80  Rhythm: normal sinus rhythm  Lab Results:  Basename 10/04/11 0542  WBC 5.9  HGB 13.7  PLT 225    Basename 10/05/11 0600 10/04/11 0542  NA 138 138  K 3.5 3.3*  CL 102 100  CO2 28 26  GLUCOSE 86 111*  BUN 13 17  CREATININE 1.30 1.17    Basename 10/04/11 2341 10/04/11 1508  TROPONINI <0.30 <0.30   Hepatic Function Panel  Basename 10/04/11 0850  PROT 7.3  ALBUMIN 3.4*  AST 43*  ALT 31  ALKPHOS 111  BILITOT 0.8  BILIDIR 0.2  IBILI 0.6   No results found for this basename: CHOL in the last 72 hours  Basename 10/05/11 0600  INR 1.16    Imaging: Imaging results have been reviewed  Cardiac Studies:  Assessment/Plan:   Principal Problem:  *Acute pulmonary edema Active Problems:  HTN (hypertension), malignant  Non compliance with medical treatment because of financial issues  Chest pain, r/o MI  Cardiomyopathy, global, EF 20-25%  LVH (left ventricular hypertrophy)  Gastroesophageal reflux symptoms  Plan- He is improved symptomatically after 2.5L diuresis. He required several medications to achieve B/P control. His SCr 1.17-1.3 (1.9 in 2009). It may be best  to wait 48hrs to cath to make sure renal function is stable on ACE and with diuresis, will discuss with MD.    Corine Shelter PA-C 10/05/2011, 8:05 AM   Patient seen and examined. Agree with assessment and plan.  Pt feels improved. SOB resolved. No chest pain.  Discussed R and L cath with pt and wife. Cr 1.3.  Cath later today.    Lennette Bihari, MD, [facility] 10/05/2011 1:21 PM

## 2011-10-05 NOTE — CV Procedure (Signed)
Thomas Bullock is a 61 y.o. male    [id number]  [id number] LOCATION:  FACILITY: [facility]  PHYSICIAN: Bishop Limbo, M.D., [facility] March 03, 1951   DATE OF PROCEDURE:  10/05/2011    [facility]  CARDIAC CATHETERIZATION     History obtained from the patient.   PROCEDURE DESCRIPTION: R and L Heart Cath  The patient was brought to the second floor  [place] Cardiac cath lab in the postabsorptive state. He   premedicated with 2 mg Versed and 25 micrograms of fentanyl. His right femoral artery and veinwere prepped and shaved in usual sterile fashion. Xylocaine 1% was used for local anesthesia. A 5 French sheath was inserted into the r femoral  artery and a 7 French sheath  Into the femoral vein.    HEMODYNAMICS:   RA: 14/12 mean 10 RV: 40/9/11 PA: 40/19 Pc: mean 21  O2 saturation: PA: 54% Ao: 94%  CO: 3.7 l/min (Thermo)  CI: 2.0         3.3 l/min (Fick)        CI: 1.8  AO SYSTOLIC/AO DIASTOLIC: 120/86   LV SYSTOLIC/LV DIASTOLIC:  120/12/21  ANGIOGRAPHIC RESULTS:   1. Left main; Nl 2. LAD; 20% proximal smooth narrowing 3. Left circumflex; Nl 4. Right coronary artery; Nl  5.  Left ventriculography; RAO left ventriculogram was performed using       25 mL of Visipaque dye at 12 mL/second. The overall LVEF estimated          20 - 25% with severe global hypokinesis.  8.  Distal Aortography;  Normal withou renal artery stenosis   IMPRESSION:  Nonischemic Cardiomyopathy  Patient tolerated well.  Lennette Bihari, MD, [facility] 10/05/2011 3:54 PM

## 2011-10-05 NOTE — Progress Notes (Signed)
TO CATH. LAB. BY BED STABLE, REPORT GIVEN TO RN.

## 2011-10-05 NOTE — Progress Notes (Signed)
BACK FROM THE CATH LAB  LETHARGIC BUT AROUSABLE.. INSTRUCTIONS GIVEN AND BEDREST EMPHASIZED.

## 2011-10-06 LAB — BASIC METABOLIC PANEL
BUN: 16 mg/dL (ref 6–23)
CO2: 24 mEq/L (ref 19–32)
Calcium: 8.6 mg/dL (ref 8.4–10.5)
Creatinine, Ser: 1.3 mg/dL (ref 0.50–1.35)
Glucose, Bld: 82 mg/dL (ref 70–99)

## 2011-10-06 LAB — GLUCOSE, CAPILLARY: Glucose-Capillary: 71 mg/dL (ref 70–99)

## 2011-10-06 MED ORDER — SPIRONOLACTONE 12.5 MG HALF TABLET
12.5000 mg | ORAL_TABLET | Freq: Two times a day (BID) | ORAL | Status: DC
  Administered 2011-10-06 – 2011-10-09 (×8): 12.5 mg via ORAL
  Filled 2011-10-06 (×11): qty 1

## 2011-10-06 MED ORDER — DIGOXIN 0.0625 MG HALF TABLET
0.0625 mg | ORAL_TABLET | Freq: Every day | ORAL | Status: DC
  Administered 2011-10-06 – 2011-10-09 (×4): 0.0625 mg via ORAL
  Filled 2011-10-06 (×4): qty 1

## 2011-10-06 MED ORDER — CARVEDILOL 6.25 MG PO TABS
6.2500 mg | ORAL_TABLET | Freq: Two times a day (BID) | ORAL | Status: DC
  Administered 2011-10-06 – 2011-10-08 (×5): 6.25 mg via ORAL
  Filled 2011-10-06 (×8): qty 1

## 2011-10-06 NOTE — Progress Notes (Signed)
Pt. With transfer order. Report given to Daryl RN for Rm 2022. V/S stable, no c/o made. Transferred.

## 2011-10-06 NOTE — Progress Notes (Signed)
[facility] Progress Note  Subjective:  Breathing better; no chest pain  Objective:   Vital Signs in the last 24 hours: Temp:  [98 F (36.7 C)-98.7 F (37.1 C)] 98.4 F (36.9 C) (06/01 0735) Pulse Rate:  [60-81] 68  (06/01 0800) Resp:  [17-28] 20  (06/01 0800) BP: (119-154)/(79-112) 147/103 mmHg (06/01 0800) SpO2:  [93 %-100 %] 97 % (06/01 0800) FiO2 (%):  [2 %] 2 % (05/31 1800) Weight:  [77.384 kg (170 lb 9.6 oz)] 77.384 kg (170 lb 9.6 oz) (06/01 0500)  Intake/Output from previous day: 05/31 0701 - 06/01 0700 In: 1873 [P.O.:410; I.V.:1463] Out: 800 [Urine:800]  Scheduled:   . aspirin EC  325 mg Oral Daily  . cloNIDine  0.1 mg Oral BID  . diazepam  5 mg Oral On Call  . fentaNYL      . furosemide  40 mg Oral BID  . heparin      . heparin  5,000 Units Subcutaneous Q8H  . lidocaine      . lisinopril  20 mg Oral Daily  . metoprolol tartrate  12.5 mg Oral BID  . midazolam      . nitroGLYCERIN      . pantoprazole  40 mg Oral BID AC  . potassium chloride  20 mEq Oral Daily  . potassium chloride  40 mEq Oral Once  . sodium chloride  3 mL Intravenous Q12H  . DISCONTD: aspirin EC  81 mg Oral Daily  . DISCONTD: sodium chloride  3 mL Intravenous Q12H    Physical Exam:   General appearance: alert, cooperative and no distress Neck: no carotid bruit, no JVD, supple, symmetrical, trachea midline and thyroid not enlarged, symmetric, no tenderness/mass/nodules Lungs: decreased BS at bases Heart: S1, S2 normal  1/6 sem Abdomen: soft, non-tender; bowel sounds normal; no masses,  no organomegaly R groin site stable Extremities: no edema, redness or tenderness in the calves or thighs   Rate: 74  Rhythm: normal sinus rhythm  Lab Results:    Basename 10/06/11 0625 10/05/11 1653 10/05/11 0600  NA 134* -- 138  K 4.0 -- 3.5  CL 98 -- 102  CO2 24 -- 28  GLUCOSE 82 -- 86  BUN 16 -- 13  CREATININE 1.30 1.33 --    Basename 10/04/11 2341 10/04/11  1508  TROPONINI <0.30 <0.30   Hepatic Function Panel  Basename 10/04/11 0850  PROT 7.3  ALBUMIN 3.4*  AST 43*  ALT 31  ALKPHOS 111  BILITOT 0.8  BILIDIR 0.2  IBILI 0.6   Lipid Panel  No results found for this basename: chol, trig, hdl, cholhdl, vldl, ldlcalc     Basename 16/10/96 0600  INR 1.16    Lipid Panel  No results found for this basename: chol, trig, hdl, cholhdl, vldl, ldlcalc     Imaging:  No results found.    Assessment/Plan:   Principal Problem:  *Acute pulmonary edema Active Problems:  HTN (hypertension), malignant  LVH (left ventricular hypertrophy)  Non compliance with medical treatment because of financial issues  Chest pain, r/o MI  Gastroesophageal reflux symptoms  Cardiomyopathy, global, EF 20-25%   Cath consistent with NICM.  Will add spironolactone 12.5 mg bid for aldosterone blockade and DC supplemental K.  Change lopressor to carvedilol.  Consider low dose lanoxin.  Will need life vest as medications are titrated until LV fxn re-assessment in 3 mo. Transfer to telemetry.   Lennette Bihari, MD, [facility] 10/06/2011, 9:47 AM

## 2011-10-07 ENCOUNTER — Inpatient Hospital Stay (HOSPITAL_COMMUNITY): Payer: Medicaid Other

## 2011-10-07 LAB — GLUCOSE, CAPILLARY: Glucose-Capillary: 102 mg/dL — ABNORMAL HIGH (ref 70–99)

## 2011-10-07 MED ORDER — LISINOPRIL 40 MG PO TABS
40.0000 mg | ORAL_TABLET | Freq: Every day | ORAL | Status: DC
  Administered 2011-10-08 – 2011-10-09 (×2): 40 mg via ORAL
  Filled 2011-10-07 (×2): qty 1

## 2011-10-07 NOTE — Progress Notes (Signed)
[facility]  Subjective: No complaints  Objective: Vital signs in last 24 hours: Temp:  [97.3 F (36.3 C)-98.4 F (36.9 C)] 98.4 F (36.9 C) (06/02 0512) Pulse Rate:  [66-88] 66  (06/02 0955) Resp:  [19-26] 19  (06/02 0512) BP: (132-167)/(94-117) 159/103 mmHg (06/02 0749) SpO2:  [92 %-100 %] 100 % (06/02 0512) Weight:  [77.111 kg (170 lb)] 77.111 kg (170 lb) (06/02 0514) Last BM Date: 10/07/11  Intake/Output from previous day: 06/01 0701 - 06/02 0700 In: 1925.2 [P.O.:880; I.V.:1045.2] Out: 1500 [Urine:1500] Intake/Output this shift: Total I/O In: 240 [P.O.:240] Out: -   Medications Current Facility-Administered Medications  Medication Dose Route Frequency Provider Last Rate Last Dose  . 0.9 %  sodium chloride infusion   Intravenous Continuous Runell Gess, MD 10 mL/hr at 10/06/11 1800    . 0.9 %  sodium chloride infusion   Intravenous Continuous Lennette Bihari, MD 75 mL/hr at 10/06/11 1800    . acetaminophen (TYLENOL) tablet 650 mg  650 mg Oral Q4H PRN Lennette Bihari, MD   650 mg at 10/07/11 0636  . ALPRAZolam Prudy Feeler) tablet 0.25 mg  0.25 mg Oral TID PRN Abelino Derrick, PA      . carvedilol (COREG) tablet 6.25 mg  6.25 mg Oral BID WC Lennette Bihari, MD   6.25 mg at 10/07/11 0749  . digoxin (LANOXIN) tablet 0.0625 mg  0.0625 mg Oral Daily Lennette Bihari, MD   0.0625 mg at 10/07/11 0955  . furosemide (LASIX) tablet 40 mg  40 mg Oral BID Abelino Derrick, PA   40 mg at 10/07/11 0749  . heparin injection 5,000 Units  5,000 Units Subcutaneous Q8H Rhetta Mura, MD   5,000 Units at 10/07/11 0629  . hydrALAZINE (APRESOLINE) injection 10 mg  10 mg Intravenous Q4H PRN Rhetta Mura, MD      . lisinopril (PRINIVIL,ZESTRIL) tablet 20 mg  20 mg Oral Daily Eda Paschal [place], [place]   20 mg at 10/07/11 0954  . nitroGLYCERIN 0.2 mg/mL in dextrose 5 % infusion  5 mcg/min Intravenous Titrated Sunnie Nielsen, MD   35 mcg/min at 10/06/11 1800  . pantoprazole  (PROTONIX) EC tablet 40 mg  40 mg Oral BID AC Rhetta Mura, MD   40 mg at 10/07/11 0749  . sodium chloride 0.9 % injection 3 mL  3 mL Intravenous Q12H Rhetta Mura, MD   3 mL at 10/07/11 1000  . spironolactone (ALDACTONE) tablet 12.5 mg  12.5 mg Oral BID Lennette Bihari, MD   12.5 mg at 10/07/11 0953  . traMADol (ULTRAM) tablet 50 mg  50 mg Oral Q6H PRN Abelino Derrick, PA   50 mg at 10/05/11 2313  . zolpidem (AMBIEN) tablet 10 mg  10 mg Oral QHS PRN Abelino Derrick, PA      . DISCONTD: aspirin EC tablet 325 mg  325 mg Oral Daily Lennette Bihari, MD   325 mg at 10/06/11 1005  . DISCONTD: morphine 2 MG/ML injection 2 mg  2 mg Intravenous Q1H PRN Lennette Bihari, MD      . DISCONTD: ondansetron [facility]) injection 4 mg  4 mg Intravenous Q6H PRN Lennette Bihari, MD        PE: General appearance: alert, cooperative and no distress Lungs: Decreased BS on the right.  mild rales at left base. Heart: regular rate and rhythm, S1, S2 normal, no murmur, click, rub or gallop Extremities: No LEE Pulses: Radials  2+ and symmetric  Lab Results:   Basename 10/05/11 1653  WBC 5.4  HGB 12.0*  HCT 34.8*  PLT 160   BMET  Basename 10/06/11 0625 10/05/11 1653 10/05/11 0600  NA 134* -- 138  K 4.0 -- 3.5  CL 98 -- 102  CO2 24 -- 28  GLUCOSE 82 -- 86  BUN 16 -- 13  CREATININE 1.30 1.33 1.30  CALCIUM 8.6 -- 8.6   PT/INR  Basename 10/05/11 0600  LABPROT 15.0  INR 1.16    Assessment/Plan  Principal Problem:  *Acute pulmonary edema Active Problems:  HTN (hypertension), malignant  LVH (left ventricular hypertrophy)  Non compliance with medical treatment because of financial issues  Chest pain, r/o MI  Gastroesophageal reflux symptoms  Cardiomyopathy, global, EF 20-25%  Plan:  NICM EF 25-30%.  Will arrange Life vest tomorrow.  Aldactone added yesterday.  BP 132/94 - 167/117.   ? titrate coreg.  Net positive I/O the last two days.  Recheck BNP in AM.  CXR today.    LOS: 3 days     HAGER, BRYAN 10/07/2011 11:11 AM   Patient seen and examined. Agree with assessment and plan.  Clinically well compensated with NICM.Marland Kitchen Will further titrate lisinopril today to 40 mg.  Plan for life vest placement tomorrow with possible dc later in day and further medication titration as outpt.   Lennette Bihari, MD, [facility] 10/07/2011 11:32 AM

## 2011-10-08 LAB — GLUCOSE, CAPILLARY

## 2011-10-08 MED ORDER — CARVEDILOL 12.5 MG PO TABS
12.5000 mg | ORAL_TABLET | Freq: Two times a day (BID) | ORAL | Status: DC
  Administered 2011-10-08 – 2011-10-09 (×3): 12.5 mg via ORAL
  Filled 2011-10-08 (×4): qty 1

## 2011-10-08 NOTE — Progress Notes (Deleted)
   CARE MANAGEMENT NOTE 10/08/2011  Patient:  Selena Batten   Account Number:  [id number]  Date Initiated:  10/08/2011  Documentation initiated by:  GRAVES-BIGELOW,Addaleigh Nicholls  Subjective/Objective Assessment:   Pt admitted with cp and hyperglycemia. BS greater than 600 on admission. Pt placed on insulin gtt and converted to SSI now. Per MD notes pt has been noncompliant with medications.     Action/Plan:   CM spoke to pt and he is from home alone with family support. Pt states he was not able to afford co pay for meds. CM will check on co pay for lantus. CM did discuss that RN will provide pt with nutrition information and pt will benefit from diabetic educator consult. Please see if pt can be d/c on cheaper insulin if possible. Pt will benefit from Hgb A1 c check. CM did make pt aware that if he is d/c on diabetic po medications - he can check [facility] and they have some po med's free.     Anticipated DC Date:  10/10/2011   Anticipated DC Plan:  HOME/SELF CARE      DC Planning Services  CM consult      Choice offered to / List presented to:             Status of service:  In process, will continue to follow Medicare Important Message given?   (If response is "NO", the following Medicare IM given date fields will be blank) Date Medicare IM given:   Date Additional Medicare IM given:    Discharge Disposition:    Per UR Regulation:  Reviewed for med. necessity/level of care/duration of stay  If discussed at Long Length of Stay Meetings, dates discussed:    Comments:  10-08-11 [address] [place] [phone]

## 2011-10-08 NOTE — Progress Notes (Signed)
[facility]  Subjective:  No CP or SOB.  Objective: Vital signs in last 24 hours: Temp:  [97.8 F (36.6 C)-98.4 F (36.9 C)] 98.2 F (36.8 C) (06/03 0452) Pulse Rate:  [60-76] 76  (06/03 0452) Resp:  [18-20] 20  (06/03 0452) BP: (147-160)/(98-112) 160/112 mmHg (06/03 0452) SpO2:  [97 %-99 %] 98 % (06/03 0452) Weight:  [74.617 kg (164 lb 8 oz)] 74.617 kg (164 lb 8 oz) (06/03 0452) Last BM Date: 10/07/11  Intake/Output from previous day: 06/02 0701 - 06/03 0700 In: 720 [P.O.:720] Out: 2850 [Urine:2850] Intake/Output this shift: Total I/O In: -  Out: 300 [Urine:300]  Medications Current Facility-Administered Medications  Medication Dose Route Frequency Provider Last Rate Last Dose  . 0.9 %  sodium chloride infusion   Intravenous Continuous Runell Gess, MD 10 mL/hr at 10/06/11 1800    . 0.9 %  sodium chloride infusion   Intravenous Continuous Lennette Bihari, MD 75 mL/hr at 10/06/11 1800    . acetaminophen (TYLENOL) tablet 650 mg  650 mg Oral Q4H PRN Lennette Bihari, MD   650 mg at 10/07/11 0636  . ALPRAZolam Prudy Feeler) tablet 0.25 mg  0.25 mg Oral TID PRN Abelino Derrick, PA      . carvedilol (COREG) tablet 6.25 mg  6.25 mg Oral BID WC Lennette Bihari, MD   6.25 mg at 10/08/11 0824  . digoxin (LANOXIN) tablet 0.0625 mg  0.0625 mg Oral Daily Lennette Bihari, MD   0.0625 mg at 10/07/11 0955  . furosemide (LASIX) tablet 40 mg  40 mg Oral BID Abelino Derrick, PA   40 mg at 10/08/11 0824  . heparin injection 5,000 Units  5,000 Units Subcutaneous Q8H Rhetta Mura, MD   5,000 Units at 10/08/11 0527  . hydrALAZINE (APRESOLINE) injection 10 mg  10 mg Intravenous Q4H PRN Rhetta Mura, MD      . lisinopril (PRINIVIL,ZESTRIL) tablet 40 mg  40 mg Oral Daily Lennette Bihari, MD      . nitroGLYCERIN 0.2 mg/mL in dextrose 5 % infusion  5 mcg/min Intravenous Titrated Sunnie Nielsen, MD   35 mcg/min at 10/06/11 1800  . pantoprazole (PROTONIX) EC tablet 40 mg   40 mg Oral BID AC Rhetta Mura, MD   40 mg at 10/08/11 0823  . sodium chloride 0.9 % injection 3 mL  3 mL Intravenous Q12H Rhetta Mura, MD   3 mL at 10/07/11 2213  . spironolactone (ALDACTONE) tablet 12.5 mg  12.5 mg Oral BID Lennette Bihari, MD   12.5 mg at 10/08/11 0824  . traMADol (ULTRAM) tablet 50 mg  50 mg Oral Q6H PRN Abelino Derrick, PA   50 mg at 10/05/11 2313  . zolpidem (AMBIEN) tablet 10 mg  10 mg Oral QHS PRN Abelino Derrick, PA      . DISCONTD: lisinopril (PRINIVIL,ZESTRIL) tablet 20 mg  20 mg Oral Daily Eda Paschal [place], [place]   20 mg at 10/07/11 0954    PE: General appearance: alert, cooperative and no distress Lungs: clear to auscultation bilaterally Heart: regular rate and rhythm, S1, S2 normal, no murmur, click, rub or gallop Extremities: Trace left LEE edema Pulses: Radials 2+ and symmetric  Lab Results:   Basename 10/05/11 1653  WBC 5.4  HGB 12.0*  HCT 34.8*  PLT 160   BMET  Basename 10/06/11 0625 10/05/11 1653  NA 134* --  K 4.0 --  CL 98 --  CO2 24 --  GLUCOSE 82 --  BUN 16 --  CREATININE 1.30 1.33  CALCIUM 8.6 --   Pro B Natriuretic peptide (BNP) 5347.0 >> 1662.0  Assessment/Plan  Principal Problem:  *Acute pulmonary edema Active Problems:  HTN (hypertension), malignant  LVH (left ventricular hypertrophy)  Non compliance with medical treatment because of financial issues  Chest pain, r/o MI  Gastroesophageal reflux symptoms  Cardiomyopathy, global, EF 20-25%  Plan:  He appears to be doing very well.  Life vest can be fitted today or tomorrow.  NICM EF 25-30%. BP  147/98 - 160/112.  Lisinopril increased to 40mg  yesterday.    BNP decreased to 1662.   LOS: 4 days    HAGER, BRYAN 10/08/2011 10:41 AM   Patient seen and examined. Agree with assessment and plan.  Tolerating increased med therapy.  Will further titrate carvedilol to 12.5 mg bid. Home tomorrow post life vest.   Lennette Bihari, MD, [facility] 10/08/2011 4:23 PM

## 2011-10-09 LAB — GLUCOSE, CAPILLARY: Glucose-Capillary: 90 mg/dL (ref 70–99)

## 2011-10-09 MED ORDER — CARVEDILOL 12.5 MG PO TABS: 12.5000 mg | ORAL_TABLET | Freq: Two times a day (BID) | ORAL | Status: DC

## 2011-10-09 MED ORDER — DIGOXIN 0.0625 MG HALF TABLET: 0.0625 mg | ORAL_TABLET | Freq: Every day | ORAL | Status: DC

## 2011-10-09 MED ORDER — SPIRONOLACTONE 12.5 MG HALF TABLET: 12.5000 mg | ORAL_TABLET | Freq: Two times a day (BID) | ORAL | Status: DC

## 2011-10-09 MED ORDER — PANTOPRAZOLE SODIUM 40 MG PO TBEC: 40.0000 mg | DELAYED_RELEASE_TABLET | Freq: Every day | ORAL | Status: DC

## 2011-10-09 MED ORDER — LISINOPRIL 40 MG PO TABS: 40.0000 mg | ORAL_TABLET | Freq: Every day | ORAL | Status: DC

## 2011-10-09 MED ORDER — FUROSEMIDE 40 MG PO TABS: 40.0000 mg | ORAL_TABLET | Freq: Every day | ORAL | Status: DC

## 2011-10-09 NOTE — Discharge Instructions (Signed)
2 Gram Low Sodium Diet A 2 gram sodium diet restricts the amount of sodium in the diet to no more than 2 g or 2000 mg daily. Limiting the amount of sodium is often used to help lower blood pressure. It is important if you have heart, liver, or kidney problems. Many foods contain sodium for flavor and sometimes as a preservative. When the amount of sodium in a diet needs to be low, it is important to know what to look for when choosing foods and drinks. The following includes some information and guidelines to help make it easier for you to adapt to a low sodium diet. QUICK TIPS  Do not add salt to food.   Avoid convenience items and fast food.   Choose unsalted snack foods.   Buy lower sodium products, often labeled as "lower sodium" or "no salt added."   Check food labels to learn how much sodium is in 1 serving.   When eating at a restaurant, ask that your food be prepared with less salt or none, if possible.  READING FOOD LABELS FOR SODIUM INFORMATION The nutrition facts label is a good place to find how much sodium is in foods. Look for products with no more than 500 to 600 mg of sodium per meal and no more than 150 mg per serving. Remember that 2 g = 2000 mg. The food label may also list foods as:  Sodium-free: Less than 5 mg in a serving.   Very low sodium: 35 mg or less in a serving.   Low-sodium: 140 mg or less in a serving.   Light in sodium: 50% less sodium in a serving. For example, if a food that usually has 300 mg of sodium is changed to become light in sodium, it will have 150 mg of sodium.   Reduced sodium: 25% less sodium in a serving. For example, if a food that usually has 400 mg of sodium is changed to reduced sodium, it will have 300 mg of sodium.  CHOOSING FOODS Grains  Avoid: Salted crackers and snack items. Some cereals, including instant hot cereals. Bread stuffing and biscuit mixes. Seasoned rice or pasta mixes.   Choose: Unsalted snack items. Low-sodium  cereals, oats, puffed wheat and rice, shredded wheat. English muffins and bread. Pasta.  Meats  Avoid: Salted, canned, smoked, spiced, pickled meats, including fish and poultry. Bacon, ham, sausage, cold cuts, hot dogs, anchovies.   Choose: Low-sodium canned tuna and salmon. Fresh or frozen meat, poultry, and fish.  Dairy  Avoid: Processed cheese and spreads. Cottage cheese. Buttermilk and condensed milk. Regular cheese.   Choose: Milk. Low-sodium cottage cheese. Yogurt. Sour cream. Low-sodium cheese.  Fruits and Vegetables  Avoid: Regular canned vegetables. Regular canned tomato sauce and paste. Frozen vegetables in sauces. Olives. Thomas Bullock. Relishes. Sauerkraut.   Choose: Low-sodium canned vegetables. Low-sodium tomato sauce and paste. Frozen or fresh vegetables. Fresh and frozen fruit.  Condiments  Avoid: Canned and packaged gravies. Worcestershire sauce. Tartar sauce. Barbecue sauce. Soy sauce. Steak sauce. Ketchup. Onion, garlic, and table salt. Meat flavorings and tenderizers.   Choose: Fresh and dried herbs and spices. Low-sodium varieties of mustard and ketchup. Lemon juice. Tabasco sauce. Horseradish.  SAMPLE 2 GRAM SODIUM MEAL PLAN Breakfast / Sodium (mg)  1 cup low-fat milk / [id number] mg   2 slices whole-wheat toast / 270 mg   1 tbs heart-healthy margarine / 153 mg   1 hard-boiled egg / 139 mg   1 small orange / 0  mg  Lunch / Sodium (mg)  1 cup raw carrots / 76 mg    cup hummus / 298 mg   1 cup low-fat milk / 143 mg    cup red grapes / 2 mg   1 whole-wheat pita bread / 356 mg  Dinner / Sodium (mg)  1 cup whole-wheat pasta / 2 mg   1 cup low-sodium tomato sauce / 73 mg   3 oz lean ground beef / 57 mg   1 small side salad (1 cup raw spinach leaves,  cup cucumber,  cup yellow bell pepper) with 1 tsp olive oil and 1 tsp red wine vinegar / 25 mg  Snack / Sodium (mg)  1 container low-fat vanilla yogurt / 107 mg   3 graham cracker squares / 127 mg  Nutrient  Analysis  Calories: 2033   Protein: 77 g   Carbohydrate: 282 g   Fat: 72 g   Sodium: 1971 mg  Document Released: 04/23/2005 Document Revised: 04/12/2011 Document Reviewed: 07/25/2009 [facility] Patient Information 2012 [place], [place].Heart Failure Heart failure (HF) is a condition in which the heart has trouble pumping blood. This means your heart does not pump blood efficiently for your body to work well. In some cases of HF, fluid may back up into your lungs or you may have swelling (edema) in your lower legs. HF is a long-term (chronic) condition. It is important for you to take good care of yourself and follow your caregiver's treatment plan. CAUSES   Health conditions:   High blood pressure (hypertension) causes the heart muscle to work harder than normal. When pressure in the blood vessels is high, the heart needs to pump (contract) with more force in order to circulate blood throughout the body. High blood pressure eventually causes the heart to become stiff and weak.   Coronary artery disease (CAD) is the buildup of cholesterol and fat (plaques) in the arteries of the heart. The blockage in the arteries deprives the heart muscle of oxygen and blood. This can cause chest pain and may lead to a heart attack. High blood pressure can also contribute to CAD.   Heart attack (myocardial infarction) occurs when 1 or more arteries in the heart become blocked. The loss of oxygen damages the muscle tissue of the heart. When this happens, part of the heart muscle dies. The injured tissue does not contract as well and weakens the heart's ability to pump blood.   Abnormal heart valves can cause HF when the heart valves do not open and close properly. This makes the heart muscle pump harder to keep the blood flowing.   Heart muscle disease (cardiomyopathy or myocarditis) is damage to the heart muscle from a variety of causes. These can include drug or alcohol abuse, infections, or unknown reasons.  These can increase the risk of HF.   Lung disease makes the heart work harder because the lungs do not work properly. This can cause a strain on the heart leading it to fail.   Diabetes increases the risk of HF. High blood sugar contributes to high fat (lipid) levels in the blood. Diabetes can also cause slow damage to tiny blood vessels that carry important nutrients to the heart muscle. When the heart does not get enough oxygen and food, it can cause the heart to become weak and stiff. This leads to a heart that does not contract efficiently.   Other diseases can contribute to HF. These include abnormal heart rhythms, thyroid problems, and low  blood counts (anemia).   Unhealthy lifestyle habits:   Obesity.   Smoking.   Eating foods high in fat and cholesterol.   Eating or drinking beverages high in salt.   Drug or alcohol abuse.   Lack of exercise.  SYMPTOMS  HF symptoms may vary and can be hard to detect. Symptoms may include:  Shortness of breath with activity, such as climbing stairs.   Persistent cough.   Swelling of the feet, ankles, legs, or abdomen.   Unexplained weight gain.   Difficulty breathing when lying flat.   Waking from sleep because of the need to sit up and get more air.   Rapid heartbeat.   Fatigue and loss of energy.   Feeling lightheaded or close to fainting.  DIAGNOSIS  A diagnosis of HF is based on your history, symptoms, physical examination, and diagnostic tests. Diagnostic tests for HF may include:  EKG.   Chest X-ray.   Blood tests.   Exercise stress test.   Blood oxygen test (arterial blood gas).   Evaluation by a heart doctor (cardiologist).   Ultrasound evaluation of the heart (echocardiogram).   Heart artery test to look for blockages (angiogram).   Radioactive imaging to look at the heart (radionuclide test).  TREATMENT  Treatment is aimed at managing the symptoms of HF. Medicines, lifestyle changes, or surgical  intervention may be necessary to treat HF.  Medicines to help treat HF may include:   Angiotensin-converting enzyme (ACE) inhibitors. These block the effects of a blood protein called angiotensin-converting enzyme. ACE inhibitors relax (dilate) the blood vessels and help lower blood pressure. This decreases the workload of the heart, slows the progression of HF, and improves symptoms.   Angiotensin receptor blockers (ARBs). These medications work similar to ACE inhibitors. ARBs may be an alternative for people who cannot tolerate an ACE inhibitor.   Aldosterone antagonists. This medication helps get rid of extra fluid from your body. This lowers the volume of blood the heart has to pump.   Water pills (diuretics). Diuretics cause the kidneys to remove salt and water from the blood. The extra fluid is removed by urination. By removing extra fluid from the body, diuretics help lower the workload of the heart and help prevent fluid buildup in the lungs so breathing is easier.   Beta blockers. These prevent the heart from beating too fast and improve heart muscle strength. Beta blockers help maintain a normal heart rate, control blood pressure, and improve HF symptoms.   Digitalis. This increases the force of the heartbeat and may be helpful to people with HF or heart rhythm problems.   Healthy lifestyle changes include:   Stopping smoking.   Eating a healthy diet. Avoid foods high in fat. Avoid foods fried in oil or made with fat. A dietician can help with healthy food choices.   Limiting how much salt you eat.   Limiting alcohol intake to no more than 1 drink per day for women and 2 drinks per day for men. Drinking more than that is harmful to your heart. If your heart has already been damaged by alcohol or you have severe HF, drinking alcohol should be stopped completely.   Exercising as directed by your caregiver.   Surgical treatment for HF may include:   Procedures to open blocked  arteries, repair damaged heart valves, or remove damaged heart muscle tissue.   A pacemaker to help heart muscle function and to control certain abnormal heart rhythms.   A defibrillator to  possibly prevent sudden cardiac death.  HOME CARE INSTRUCTIONS   Activity level. Your caregiver can help you determine what type of exercise program may be helpful. It is important to maintain your strength. Pace your physical activity to avoid shortness of breath or chest pain. Rest for 1 hour before and after meals. A cardiac rehabilitation program may be helpful to some people with HF.   Diet. Eat a heart healthy diet. Food choices should be low in saturated fat and cholesterol. Talk to a dietician to learn about heart healthy foods.   Salt intake. When you have HF, you need to limit the amount of salt you eat. Eat less than 1500 milligrams (mg) of salt per day or as recommended by your caregiver.   Weight monitoring. Weigh yourself every day. You should weigh yourself in the morning after you urinate and before you eat breakfast. Wear the same amount of clothing each time you weigh yourself. Record your weight daily. Bring your recorded weights to your clinic visits. Tell your caregiver right away if you have gained 3 lb/1.4 kg in 1 day, or 5 lb/2.3 kg in a week or whatever amount you were told to report.   Blood pressure monitoring. This should be done as directed by your caregiver. A home blood pressure cuff can be purchased at a drugstore. Record your blood pressure numbers and bring them to your clinic visits. Tell your caregiver if you become dizzy or lightheaded upon standing up.   Smoking. If you are currently a smoker, it is time to quit. Nicotine makes your heart work harder by causing your blood vessels to constrict. Do not use nicotine gum or patches before talking to your caregiver.   Follow up. Be sure to schedule a follow-up visit with your caregiver. Keep all your appointments.  SEEK MEDICAL  CARE IF:   Your weight increases by 3 lb/1.4 kg in 1 day or 5 lb/2.3 kg in a week.   You notice increasing shortness of breath that is unusual for you. This may happen during rest, sleep, or with activity.   You cough more than normal, especially with physical activity.   You notice more swelling in your hands, feet, ankles, or belly (abdomen).   You are unable to sleep because it is hard to breathe.   You cough up bloody mucus (sputum).   You begin to feel "jumping" or "fluttering" sensations (palpitations) in your chest.  SEEK IMMEDIATE MEDICAL CARE IF:   You have severe chest pain or pressure which may include symptoms such as:   Pain or pressure in the arms, neck, jaw, or back.   Feeling sweaty.   Feeling sick to your stomach (nauseous).   Feeling short of breath while at rest.   Having a fast or irregular heartbeat.   You experience stroke symptoms. These symptoms include:   Facial weakness or numbness.   Weakness or numbness in an arm, leg, or on one side of your body.   Blurred vision.   Difficulty talking or thinking.   Dizziness or fainting.   Severe headache.  THESE ARE MEDICAL EMERGENCIES. Do not wait to see if the symptoms go away. Call your local emergency services (911 in [place] DO NOT drive yourself to the hospital. IMPORTANT  Make a list of every medicine, vitamin, or herbal supplement you are taking. Keep the list with you at all times. Show it to your caregiver at every visit. Keep the list up-to-date.   Ask your caregiver or  pharmacist to write an explanation of each medicine you are taking. This should include:   Why you are taking it.   The possible side effects.   The best time of day to take it.   Foods to take with it or what foods to avoid.   When to stop taking it.  MAKE SURE YOU:   Understand these instructions.   Will watch your condition.   Will get help right away if you are not doing well or get worse.  Document Released:  04/23/2005 Document Revised: 04/12/2011 Document Reviewed: 08/05/2009 [facility] Patient Information 2012 [place], [place].How to Take Your Blood Pressure  These instructions are only for electronic home blood pressure machines. You will need:   An automatic or semi-automatic blood pressure machine.   Fresh batteries for the blood pressure machine.  HOW DO I USE THESE TOOLS TO CHECK MY BLOOD PRESSURE?   There are 2 numbers that make up your blood pressure. For example: 120/80.   The first number (120 in our example) is called the "systolic pressure." It is a measure of the pressure in your blood vessels when your heart is pumping blood.   The second number (80 in our example) is called the "diastolic pressure." It is a measure of the pressure in your blood vessels when your heart is resting between beats.   Before you buy a home blood pressure machine, check the size of your arm so you can buy the right size cuff. Here is how to check the size of your arm:   Use a tape measure that shows both inches and centimeters.   Wrap the tape measure around the middle upper part of your arm. You may need someone to help you measure right.   Write down your arm measurement in both inches and centimeters.   To measure your blood pressure right, it is important to have the right size cuff.   If your arm is up to 13 inches (37 to 34 centimeters), get an adult cuff size.   If your arm is 13 to 17 inches (35 to 44 centimeters), get a large adult cuff size.   If your arm is 17 to 20 inches (45 to 52 centimeters), get an adult thigh cuff.   Try to rest or relax for at least 30 minutes before you check your blood pressure.   Do not smoke.   Do not have any drinks with caffeine, such as:   Pop.   Coffee.   Tea.   Check your blood pressure in a quiet room.   Sit down and stretch out your arm on a table. Keep your arm at about the level of your heart. Let your arm relax.  GETTING BLOOD PRESSURE  READINGS  Make sure you remove any tight-fighting clothing from your arm. Wrap the cuff around your upper arm. Wrap it just above the bend, and above where you felt the pulse. You should be able to slip a finger between the cuff and your arm. If you cannot slip a finger in the cuff, it is too tight and should be removed and rewrapped.   Some units requires you to manually pump up the arm cuff.   Automatic units inflate the cuff when you press a button.   Cuff deflation is automatic in both models.   After the cuff is inflated, the unit measures your blood pressure and pulse. The readings are displayed on a monitor. Hold still and breathe normally while the cuff  is inflated.   Getting a reading takes less than a minute.   Some models store readings in a memory. Some provide a printout of readings.   Get readings at different times of the day. You should wait at least 5 minutes between readings. Take readings with you to your next doctor's visit.  Document Released: 04/05/2008 Document Revised: 04/12/2011 Document Reviewed: 04/05/2008 [employer] Patient Information 2012 [employer], LLHypertension Hypertension is another name for high blood pressure. High blood pressure may mean that your heart needs to work harder to pump blood. Blood pressure consists of two numbers, which includes a higher number over a lower number (example: 110/72). HOME CARE   Make lifestyle changes as told by your doctor. This may include weight loss and exercise.   Take your blood pressure medicine every day.   Limit how much salt you use.   Stop smoking if you smoke.   Do not use drugs.   Talk to your doctor if you are using decongestants or birth control pills. These medicines might make blood pressure higher.   Females should not drink more than 1 alcoholic drink per day. Males should not drink more than 2 alcoholic drinks per day.   See your doctor as told.  GET HELP RIGHT AWAY IF:   You have a blood  pressure reading with a top number of 180 or higher.   You get a very bad headache.   You get blurred or changing vision.   You feel confused.   You feel weak, numb, or faint.   You get chest or belly (abdominal) pain.   You throw up (vomit).   You cannot breathe very well.  MAKE SURE YOU:   Understand these instructions.   Will watch your condition.   Will get help right away if you are not doing well or get worse.  Document Released: 10/10/2007 Document Revised: 04/12/2011 Document Reviewed: 10/10/2007 [facility] Patient Information 2012 [place], [place].C.

## 2011-10-09 NOTE — Progress Notes (Signed)
Pt. Discharged 10/09/2011  2:47 PM Discharge instructions reviewed with patient/family. Patient/family verbalized understanding. All Rx's given. Questions answered as needed. Pt. Discharged to home with family/self.  Thomas Bullock

## 2011-10-09 NOTE — Progress Notes (Signed)
Subjective:  No SOB  Objective:  Vital Signs in the last 24 hours: Temp:  [98.3 F (36.8 C)-98.7 F (37.1 C)] 98.7 F (37.1 C) (06/04 0515) Pulse Rate:  [62-73] 63  (06/04 0900) Resp:  [18] 18  (06/04 0515) BP: (143-157)/(62-108) 143/108 mmHg (06/04 0900) SpO2:  [98 %-99 %] 98 % (06/04 0515)  Intake/Output from previous day:  Intake/Output Summary (Last 24 hours) at 10/09/11 1211 Last data filed at 10/09/11 0500  Gross per 24 hour  Intake    483 ml  Output   2976 ml  Net  -2493 ml    Physical Exam: General appearance: alert, cooperative and no distress Lungs: clear to auscultation bilaterally Heart: regular rate and rhythm 1/6 sem, no change Abd; soft No edema   Rate: 62  Rhythm: normal sinus rhythm  Lab Results: No results found for this basename: WBC:2,HGB:2,PLT:2 in the last 72 hours No results found for this basename: NA:2,K:2,CL:2,CO2:2,GLUCOSE:2,BUN:2,CREATININE:2 in the last 72 hours No results found for this basename: TROPONINI:2,CK,MB:2 in the last 72 hours Hepatic Function Panel No results found for this basename: PROT,ALBUMIN,AST,ALT,ALKPHOS,BILITOT,BILIDIR,IBILI in the last 72 hours No results found for this basename: CHOL in the last 72 hours No results found for this basename: INR in the last 72 hours  Imaging: Imaging results have been reviewed  Cardiac Studies:  Assessment/Plan:   Principal Problem:  *Acute pulmonary edema Active Problems:  HTN (hypertension), malignant  Non compliance with medical treatment because of financial issues  Chest pain, r/o MI  Cardiomyopathy, global, EF 20-25%  LVH (left ventricular hypertrophy)  Gastroesophageal reflux symptoms   Plan- Discharge, office follow up with me in one week, Dr Royann Shivers 4 weeks. Keep meds generic, no insurance.   Corine Shelter PA-C 10/09/2011, 12:11 PM   Patient seen and examined. Agree with assessment and plan. Now on a good regimen.  Will decrease lasix to daily from bid. Life  vest provided.   For dc later today.   Lennette Bihari, MD, [facility] 10/09/2011 1:46 PM

## 2011-10-11 NOTE — Discharge Summary (Signed)
Physician Discharge Summary  Patient ID: Thomas Bullock MRN: [id number] DOB/AGE: [birth year] 61 y.o.  Admit date: 10/04/2011 Discharge date: 10/11/2011  Admission Diagnoses:   Discharge Diagnoses:  Principal Problem:  *Acute pulmonary edema Active Problems:  HTN (hypertension), malignant  LVH (left ventricular hypertrophy)  Non compliance with medical treatment because of financial issues  Chest pain, r/o MI  Gastroesophageal reflux symptoms  Cardiomyopathy, global, EF 20-25%   Discharged Condition: stable  Hospital Course:   The patient is a 61 y.o. male with a past medical history significant for HTN. He had seen Dr Clarene Duke in 2009. No records were available. 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 He now works at [employer]" in [employer] but has no insurance. He basically has been without medicines since 2009. He has not been admitted or had any surgeries since we last saw him. He presented to the ER with complaints of SOB, orthopnea, and epigastric chest pain that started last night . In the ER his B/P was over 200 systolic. His CXR showed CHF and his BNP is 5347. His EKG showed NSR and new LVH with NSST changes compared with 2009. He has has some improvement with IV NTG and IV Lasix. He is on SQ Heparin.  He was taken for left heart cath which revealed an LAD with 20% smooth narrowing but otherwise normal coronaries.  His EF was 20-25% with severe global hypokinesis. Spironolactone added 12.5 mg bid for aldosterone blockade and DCd supplemental K.  Lopressor changed to carvedilol.  Life Vest was ordered and fitted by [employer].  Lisinopril was titrated to 40mg .  BNP decreased.  The patient was discharged in stable condition after being seen by Dr. Tresa Endo.  F/U appt. Was arranged and 2D Echo in three months.   Consults: None  Significant Diagnostic Studies: HEMODYNAMICS:  RA: 14/12 mean 10  RV: 40/9/11  PA: 40/19  Pc: mean 21    O2 saturation:  PA: 54%  Ao: 94%  CO: 3.7 l/min (Thermo) CI: 2.0  3.3 l/min (Fick) CI: 1.8  AO SYSTOLIC/AO DIASTOLIC: 120/86  LV SYSTOLIC/LV DIASTOLIC: 120/12/21  ANGIOGRAPHIC RESULTS:  1. Left main; Nl  2. LAD; 20% proximal smooth narrowing  3. Left circumflex; Nl  4. Right coronary artery; Nl  5. Left ventriculography; RAO left ventriculogram was performed using  25 mL of Visipaque dye at 12 mL/second. The overall LVEF estimated 20 - 25% with severe global hypokinesis.  8. Distal Aortography; Normal withou renal artery stenosis  IMPRESSION: Nonischemic Cardiomyopathy  Patient tolerated well.  Lennette Bihari, MD, [facility]  10/05/2011  3:54 PM  Treatments: See Summary  Discharge Exam: Blood pressure 149/101, pulse 62, temperature 98.5 F (36.9 C), temperature source Oral, resp. rate 18, height 5\' 6"  (1.676 m), weight 74.617 kg (164 lb 8 oz), SpO2 98.00%.   Disposition: 01-Home or Self Care  Discharge Orders    Future Orders Please Complete By Expires   Diet - low sodium heart healthy      Increase activity slowly      Discharge instructions      Comments:   Weigh yourself daily.  If you have a 1-2 pound weight gain in 24hrs or 4-5 pounds in a week, call our office.   No Salt!     Medication List  As of 10/11/2011  2:07 PM   STOP taking these medications         ibuprofen 200 MG  tablet         TAKE these medications         carvedilol 12.5 MG tablet   Commonly known as: COREG   Take 1 tablet (12.5 mg total) by mouth 2 (two) times daily with a meal.      digoxin 0.0625 mg Tabs   Commonly known as: LANOXIN   Take 0.5 tablets (0.0625 mg total) by mouth daily.      furosemide 40 MG tablet   Commonly known as: LASIX   Take 1 tablet (40 mg total) by mouth daily.      lisinopril 40 MG tablet   Commonly known as: PRINIVIL,ZESTRIL   Take 1 tablet (40 mg total) by mouth daily.      pantoprazole 40 MG tablet   Commonly known as: PROTONIX   Take 1 tablet (40 mg total)  by mouth daily.      spironolactone 12.5 mg Tabs   Commonly known as: ALDACTONE   Take 0.5 tablets (12.5 mg total) by mouth 2 (two) times daily.           Follow-up Information    Follow up with Cleve Paolillo, PA. (Our office will call you with the appt date and time.)    Contact information:   [address] [place] [address] [phone]          Signed: Wilburt Finlay 10/11/2011, 2:07 PM

## 2011-11-07 ENCOUNTER — Ambulatory Visit (INDEPENDENT_AMBULATORY_CARE_PROVIDER_SITE_OTHER): Payer: Self-pay | Admitting: Family Medicine

## 2011-11-07 ENCOUNTER — Encounter: Payer: Self-pay | Admitting: Family Medicine

## 2011-11-07 VITALS — BP 126/82 | HR 61 | Ht 66.0 in | Wt 163.0 lb

## 2011-11-07 DIAGNOSIS — I517 Cardiomegaly: Secondary | ICD-10-CM

## 2011-11-07 DIAGNOSIS — I10 Essential (primary) hypertension: Secondary | ICD-10-CM | POA: Insufficient documentation

## 2011-11-07 DIAGNOSIS — I5020 Unspecified systolic (congestive) heart failure: Secondary | ICD-10-CM

## 2011-11-07 DIAGNOSIS — K219 Gastro-esophageal reflux disease without esophagitis: Secondary | ICD-10-CM

## 2011-11-07 HISTORY — DX: Unspecified systolic (congestive) heart failure: I50.20

## 2011-11-07 MED ORDER — LISINOPRIL 40 MG PO TABS: 40.0000 mg | ORAL_TABLET | Freq: Every day | ORAL | Status: DC

## 2011-11-07 MED ORDER — CARVEDILOL 12.5 MG PO TABS: 12.5000 mg | ORAL_TABLET | Freq: Two times a day (BID) | ORAL | Status: DC

## 2011-11-07 MED ORDER — HYDRALAZINE HCL 25 MG PO TABS: 12.5000 mg | ORAL_TABLET | Freq: Two times a day (BID) | ORAL | Status: DC

## 2011-11-07 MED ORDER — SPIRONOLACTONE 12.5 MG HALF TABLET: 12.5000 mg | ORAL_TABLET | Freq: Two times a day (BID) | ORAL | Status: DC

## 2011-11-07 MED ORDER — PANTOPRAZOLE SODIUM 40 MG PO TBEC: 40.0000 mg | DELAYED_RELEASE_TABLET | Freq: Every day | ORAL | Status: DC

## 2011-11-07 NOTE — Assessment & Plan Note (Signed)
Well controlled. Refilled all medications per patient request.

## 2011-11-07 NOTE — Assessment & Plan Note (Signed)
Patient appears euvolemic. Tempted to stop lasix but patient has cardiology f/u in 1 week so will defer.

## 2011-11-07 NOTE — Progress Notes (Signed)
  Subjective:    Patient ID: Thomas Bullock, male    DOB: [birth year], 61 y.o.   MRN: [id number]  HPI  1. CHF-patient hospitalized with flash pulmonary edema due to malignant hypertension in June 2013 due to medication noncompliance for financial reasons. EF 20-25%. Started on appropriate therapy at that time. Has follow up with [facility] and vascular on July 10th and September 4th. Patient denies orthopnea, PND, chest pain, LE edema.   2. HTN-patient compliant with medications. BP well controlled.   3. GERD-well controlled on PPI   Review of Systems -See HPI  Past Medical History-smoking status noted: never smoker. Reviewed problem list.  Medications- reviewed and updated Chief complaint-noted      Objective:   Physical Exam  Constitutional: He is oriented to person, place, and time. He appears well-developed and well-nourished. No distress.       Pleasant, talkative  HENT:  Head: Normocephalic and atraumatic.  Mouth/Throat: Oropharynx is clear and moist.  Neck: Normal range of motion. Neck supple.  Cardiovascular: Normal rate, regular rhythm and intact distal pulses.  Exam reveals no gallop and no friction rub.   No murmur heard. Pulmonary/Chest: Effort normal and breath sounds normal. He has no wheezes. He has no rales.  Abdominal: Soft. Bowel sounds are normal. He exhibits no distension.  Musculoskeletal: Normal range of motion. He exhibits no edema.  Neurological: He is alert and oriented to person, place, and time.  Skin: Skin is warm and dry.          Assessment & Plan:  Essential that patient follows up with [name] so can establish with PCP and get medication assistance.

## 2011-11-07 NOTE — Assessment & Plan Note (Addendum)
Refilled PPI. Well controlled.

## 2011-11-07 NOTE — Patient Instructions (Signed)
Dear Thomas Bullock,   It was great to meet you today. Thank you for coming to clinic. Please read below regarding the issues that we discussed.   1. I am going to refill all of your medicines.  2. I want you to follow up with [name] as soon as possible. Once you have established with [name] and gotten the orange card. I want you to come back to see me so we can check some labs.  3. We will get records from your heart doctor after you have seen them.   Please follow up in clinic in 4 weeks . Please call earlier if you have any questions or concerns.   Sincerely,  Dr. Tana Conch  P.S. Remember to call us if you were to start experiencing worsening shortness of breath, you gained 3-5 pounds over just a few days, or you are having to sleep with more pillows.

## 2012-10-11 ENCOUNTER — Other Ambulatory Visit: Payer: Self-pay | Admitting: Physician Assistant

## 2012-10-27 ENCOUNTER — Other Ambulatory Visit: Payer: Self-pay | Admitting: Physician Assistant

## 2012-10-28 NOTE — Telephone Encounter (Signed)
Rx was sent to pharmacy electronically. 

## 2012-12-04 ENCOUNTER — Encounter: Payer: Self-pay | Admitting: Cardiovascular Disease

## 2012-12-04 ENCOUNTER — Ambulatory Visit (INDEPENDENT_AMBULATORY_CARE_PROVIDER_SITE_OTHER): Payer: Medicaid Other | Admitting: Cardiovascular Disease

## 2012-12-04 VITALS — BP 120/90 | HR 53 | Ht 66.0 in | Wt 174.3 lb

## 2012-12-04 DIAGNOSIS — I517 Cardiomegaly: Secondary | ICD-10-CM

## 2012-12-04 DIAGNOSIS — E782 Mixed hyperlipidemia: Secondary | ICD-10-CM

## 2012-12-04 DIAGNOSIS — I119 Hypertensive heart disease without heart failure: Secondary | ICD-10-CM

## 2012-12-04 DIAGNOSIS — I428 Other cardiomyopathies: Secondary | ICD-10-CM

## 2012-12-04 DIAGNOSIS — K219 Gastro-esophageal reflux disease without esophagitis: Secondary | ICD-10-CM

## 2012-12-04 DIAGNOSIS — I420 Dilated cardiomyopathy: Secondary | ICD-10-CM | POA: Insufficient documentation

## 2012-12-04 DIAGNOSIS — I10 Essential (primary) hypertension: Secondary | ICD-10-CM

## 2012-12-04 NOTE — Progress Notes (Signed)
Patient ID: Thomas Bullock, male   DOB: Feb 25, 1951, 62 y.o.   MRN: [id number]     HPI: Thomas Bullock, is a 62 y.o. male who presents to the office for a six-month cardiology evaluation. Thomas Bullock a long-standing history of hypertension and in May 2013 after being off his medications for some time presented to [facility] in the setting of acute pulmonary edema. He was found to have a nonischemic cardiac myopathy with an ejection fraction of 20-25% and minimal CAD with 20% narrowing in the LAD noted at catheterization. Suddenly, aggressive medical therapy with gradual titration of his medications improved LV function. An echo Doppler study in September 2013 showed an ejection fraction of 45%.  Over the past 6 months, he has continued to do well the he denies any significant shortness of breath but still does some notes a mild shortness of breath with activity. He denies any episodes of chest pain. He is unaware of any tachycardia palpitations presyncope or syncope or arrhythmia.  Past Medical History  Diagnosis Date  . Hypertension   . Acute pulmonary edema 10/04/2011  . LVH (left ventricular hypertrophy) 10/04/2011  . Systolic CHF 11/07/2011    Hospitalized for flash pulmonary edema in June 2012 due to malignant HTN.   EF was 20-25% with severe global hypokinesis. left heart cath which revealed an LAD with 20% smooth narrowing but otherwise normal coronaries. On ace inhibitor, spironalactone, carvedilol. Diuresed in hospital.  Life Vest was ordered and fitted by [employer] to wear until September.     Marland Kitchen GERD (gastroesophageal reflux disease) 10/04/2011    Past Surgical History  Procedure Laterality Date  . Dental surgery    . Cholecystectomy, laparoscopic  2006  . Cholecystectomy      No Known Allergies  Current Outpatient Prescriptions  Medication Sig Dispense Refill  . benazepril (LOTENSIN) 40 MG tablet Take 40 mg by mouth daily.      . carvedilol (COREG) 12.5 MG tablet TAKE ONE  TABLET BY MOUTH TWICE DAILY WITH A MEAL  60 tablet  8  . digoxin (LANOXIN) 0.125 MG tablet TAKE ONE-HALF TABLET (0.0625 MG) BY MOUTH EVERY DAY  15 tablet  5  . furosemide (LASIX) 40 MG tablet TAKE ONE TABLET BY MOUTH DAILY  30 tablet  8  . hydrALAZINE (APRESOLINE) 25 MG tablet Take 37.5 mg by mouth 3 (three) times daily.      . isosorbide dinitrate (ISORDIL) 20 MG tablet Take 20 mg by mouth 3 (three) times daily.      Marland Kitchen spironolactone (ALDACTONE) 25 MG tablet TAKE ONE-HALF TABLET (12.5 MG) BY MOUTH TWICE DAILY  30 tablet  8   No current facility-administered medications for this visit.    Socially he is single. He is retired. He works in [employer]. Completed 12th grade education. There is no tobacco history. He does drink occasional alcohol  ROS is negative for fevers, chills or night sweats.  He denies PND or orthopnea. He denies presyncope or syncope. There is no wheezing. There is no chest pain. He does note some mild shortness of breath. He denies significant weight change. He denies abdominal pain. There is no melena or hematochezia. He denies GU symptoms. Denies history of feeling of swelling.  Other system review is negative.  PE BP 120/90  Pulse 53  Ht 5\' 6"  (1.676 m)  Wt 174 lb 4.8 oz (79.062 kg)  BMI 28.15 kg/m2  Repeat blood pressure by me 130/82 General: Alert, oriented, no distress.  Skin: normal turgor, no rashes HEENT: Normocephalic, atraumatic. Pupils round and reactive; sclera anicteric;no lid lag.  Nose without nasal septal hypertrophy Mouth/Parynx benign; Mallinpatti scale 3 Neck: No JVD, no carotid briuts Lungs: clear to ausculatation and percussion; no wheezing or rales Heart: RRR, s1 s2 normal no S3 gallop. 1/6 systolic murmur. Abdomen: soft, nontender; no hepatosplenomehaly, BS+; abdominal aorta nontender and not dilated by palpation. Pulses 2+ Extremities: no clubbing cyanosis or edema, Homan's sign negative  Neurologic: grossly nonfocal  ECG:  Sinus bradycardia 53 beats per minute period. PR interval 196; QTc interval 377 ms  LABS:  BMET    Component Value Date/Time   NA 134* 10/06/2011 0625   K 4.0 10/06/2011 0625   CL 98 10/06/2011 0625   CO2 24 10/06/2011 0625   GLUCOSE 82 10/06/2011 0625   BUN 16 10/06/2011 0625   CREATININE 1.30 10/06/2011 0625   CALCIUM 8.6 10/06/2011 0625   GFRNONAA 58* 10/06/2011 0625   GFRAA 67* 10/06/2011 0625     Hepatic Function Panel     Component Value Date/Time   PROT 7.3 10/04/2011 0850   ALBUMIN 3.4* 10/04/2011 0850   AST 43* 10/04/2011 0850   ALT 31 10/04/2011 0850   ALKPHOS 111 10/04/2011 0850   BILITOT 0.8 10/04/2011 0850   BILIDIR 0.2 10/04/2011 0850   IBILI 0.6 10/04/2011 0850     CBC    Component Value Date/Time   WBC 5.4 10/05/2011 1653   RBC 4.56 10/05/2011 1653   HGB 12.0* 10/05/2011 1653   HCT 34.8* 10/05/2011 1653   PLT 160 10/05/2011 1653   MCV 76.3* 10/05/2011 1653   MCH 26.3 10/05/2011 1653   MCHC 34.5 10/05/2011 1653   RDW 14.2 10/05/2011 1653   LYMPHSABS 1.5 08/29/2007 1040   MONOABS 0.5 08/29/2007 1040   EOSABS 0.0 08/29/2007 1040   BASOSABS 0.0 08/29/2007 1040     BNP    Component Value Date/Time   PROBNP 1662.0* 10/06/2011 0625    Lipid Panel  No results found for this basename: chol, trig, hdl, cholhdl, vldl, ldlcalc     RADIOLOGY: No results found.    ASSESSMENT AND PLAN: Thomas Bullock has a history of a nonischemic myopathy and presented in pulmonary edema after he had stopped his blood pressure medications for several months in May 2013. Since that time on medical therapy his ejection fraction has improved from 20% to approximately 45% on his last echo in September 2013. He did have grade 1 diastolic dysfunction. He also had mild MR and mild AR. His Heart rhythm is stable. He is doing well on his current medical regimen. He does note some very mild shortness of breath with activity. Blood pressure is well controlled. I will see him in 6 months for followup  evaluation.    Lennette Bihari, MD, [facility]  12/04/2012 7:50 PM

## 2012-12-04 NOTE — Patient Instructions (Signed)
Your physician recommends that you return for lab work fasting.  Your physician recommends that you schedule a follow-up appointment in:6 MONTHS. 

## 2012-12-09 LAB — LIPID PANEL
HDL: 38 mg/dL — ABNORMAL LOW (ref >39)
LDL Cholesterol: 80 mg/dL (ref 0–99)
Triglycerides: 134 mg/dL (ref <150)
VLDL: 27 mg/dL (ref 0–40)

## 2012-12-09 LAB — COMPREHENSIVE METABOLIC PANEL
AST: 64 U/L — ABNORMAL HIGH (ref 0–37)
Alkaline Phosphatase: 75 U/L (ref 39–117)
Glucose, Bld: 90 mg/dL (ref 70–99)
Sodium: 139 mEq/L (ref 135–145)
Total Bilirubin: 0.7 mg/dL (ref 0.3–1.2)
Total Protein: 7 g/dL (ref 6.0–8.3)

## 2012-12-09 LAB — CBC
Hemoglobin: 13.7 g/dL (ref 13.0–17.0)
MCHC: 33.4 g/dL (ref 30.0–36.0)
RDW: 15.1 % (ref 11.5–15.5)

## 2012-12-09 LAB — TSH: TSH: 2.837 u[IU]/mL (ref 0.350–4.500)

## 2012-12-15 ENCOUNTER — Other Ambulatory Visit: Payer: Self-pay | Admitting: Cardiovascular Disease

## 2012-12-15 NOTE — Telephone Encounter (Signed)
Rx was sent to pharmacy electronically. 

## 2012-12-19 ENCOUNTER — Other Ambulatory Visit: Payer: Self-pay | Admitting: *Deleted

## 2012-12-19 DIAGNOSIS — R7989 Other specified abnormal findings of blood chemistry: Secondary | ICD-10-CM

## 2012-12-19 DIAGNOSIS — Z79899 Other long term (current) drug therapy: Secondary | ICD-10-CM

## 2012-12-19 NOTE — Progress Notes (Signed)
Quick Note:  Spoke with patient informing him per Dr. Tresa Endo that his LFT'S were elevated. Recheck liver test in Few weeks. Lab orders placed and slips mailed to patient. ______

## 2013-01-09 LAB — HEPATIC FUNCTION PANEL
Albumin: 4 g/dL (ref 3.5–5.2)
Total Bilirubin: 0.8 mg/dL (ref 0.3–1.2)

## 2013-01-17 ENCOUNTER — Encounter: Payer: Self-pay | Admitting: *Deleted

## 2013-01-17 NOTE — Progress Notes (Signed)
Quick Note:    Letter sent to patient.  ______

## 2013-01-23 ENCOUNTER — Telehealth: Payer: Self-pay | Admitting: Cardiovascular Disease

## 2013-01-23 NOTE — Telephone Encounter (Signed)
Please call-concerning lab results he received in the mail.

## 2013-01-23 NOTE — Telephone Encounter (Signed)
Message forwarded to W. Waddell, CMA.  

## 2013-01-23 NOTE — Telephone Encounter (Signed)
patient called to get some clarification on the lab results that was sent to him. Results explained in full detail. Patient voiced his understanding.

## 2013-04-13 ENCOUNTER — Other Ambulatory Visit: Payer: Self-pay | Admitting: Cardiology

## 2013-04-13 NOTE — Telephone Encounter (Signed)
Rx was sent to pharmacy electronically. 

## 2013-05-03 ENCOUNTER — Other Ambulatory Visit: Payer: Self-pay | Admitting: Cardiovascular Disease

## 2013-05-04 NOTE — Telephone Encounter (Signed)
Rx was sent to pharmacy electronically. 

## 2013-05-11 ENCOUNTER — Other Ambulatory Visit: Payer: Self-pay | Admitting: Cardiology

## 2013-05-20 ENCOUNTER — Encounter: Payer: Self-pay | Admitting: Cardiovascular Disease

## 2013-05-20 ENCOUNTER — Ambulatory Visit (INDEPENDENT_AMBULATORY_CARE_PROVIDER_SITE_OTHER): Payer: Medicaid Other | Admitting: Cardiovascular Disease

## 2013-05-20 VITALS — BP 130/82 | HR 60 | Ht 66.0 in | Wt 173.5 lb

## 2013-05-20 DIAGNOSIS — R5381 Other malaise: Secondary | ICD-10-CM

## 2013-05-20 DIAGNOSIS — I517 Cardiomegaly: Secondary | ICD-10-CM

## 2013-05-20 DIAGNOSIS — E782 Mixed hyperlipidemia: Secondary | ICD-10-CM

## 2013-05-20 DIAGNOSIS — I428 Other cardiomyopathies: Secondary | ICD-10-CM | POA: Insufficient documentation

## 2013-05-20 DIAGNOSIS — Z79899 Other long term (current) drug therapy: Secondary | ICD-10-CM

## 2013-05-20 DIAGNOSIS — I10 Essential (primary) hypertension: Secondary | ICD-10-CM

## 2013-05-20 DIAGNOSIS — R5383 Other fatigue: Secondary | ICD-10-CM

## 2013-05-20 NOTE — Patient Instructions (Signed)
Dr Tresa Endo has order FASTING lab work. On the day you have this lab work done, DO NOT TAKE YOUR DIGOXIN!!  Dr Tresa Endo has requested that you have an echocardiogram in 6 months. Echocardiography is a painless test that uses sound waves to create images of your heart. It provides your doctor with information about the size and shape of your heart and how well your heart's chambers and valves are working. This procedure takes approximately one hour. There are no restrictions for this procedure.  Dr Tresa Endo wants you to follow-up in 6 months after your echocardiogram. You will receive a reminder letter in the mail two months in advance. If you don't receive a letter, please call our office to schedule the follow-up appointment.

## 2013-05-20 NOTE — Progress Notes (Signed)
Patient ID: Thomas Bullock, male   DOB: July 19, 1950, 63 y.o.   MRN: [id number]      HPI: Thomas Bullock, is a 63 y.o. male with a history of a nonischemic cardiomyopathy who presents to the office for a six-month cardiology evaluation. Mr. Thomas Bullock a long-standing history of hypertension. In May 2013 after being off his medications for some time he presented to [facility] in the setting of acute pulmonary edema. He was found to have a nonischemic cardiomyopathy with an ejection fraction of 20-25% and minimal CAD with 20% narrowing in the LAD noted at catheterization.With aggressive medical therapy with gradual titration of his medications  LV function has improved. An echo Doppler study in September 2013 showed an ejection fraction of 45%.  Over the past 6 months, he has continued to do well the he denies any significant shortness of breath but still does some notes a mild shortness of breath with activity. He denies any episodes of chest pain. He is unaware of any tachycardia palpitations presyncope or syncope or arrhythmia. He has been on a medical regimen consisting of low-dose Lanoxin at 0.0625 mg daily, a Biedl regimen although he has been taking themedications independently with 20 mg bid of Isordil and 37.5 mg 3 times a day of hydralazine, spironolactone 12.5 mg twice a day furosemide 40 mg daily, carvedilol 12.5 mg twice a day, and Lotensin 40 mg daily.  His activity level has improved. He denies awareness of palpitations. He denies presyncope or syncope. He denies chest pain per  Past Medical History  Diagnosis Date  . Hypertension   . Acute pulmonary edema 10/04/2011  . LVH (left ventricular hypertrophy) 10/04/2011  . Systolic CHF 11/07/2011    Hospitalized for flash pulmonary edema in June 2012 due to malignant HTN.   EF was 20-25% with severe global hypokinesis. left heart cath which revealed an LAD with 20% smooth narrowing but otherwise normal coronaries. On ace inhibitor,  spironalactone, carvedilol. Diuresed in hospital.  Life Vest was ordered and fitted by [facility] to wear until September.     Marland Kitchen. GERD (gastroesophageal reflux disease) 10/04/2011    Past Surgical History  Procedure Laterality Date  . Dental surgery    . Cholecystectomy, laparoscopic  2006  . Cholecystectomy      No Known Allergies  Current Outpatient Prescriptions  Medication Sig Dispense Refill  . benazepril (LOTENSIN) 40 MG tablet TAKE ONE TABLET BY MOUTH EVERY DAY(REPLACES LISINOPRIL)  30 tablet  11  . carvedilol (COREG) 12.5 MG tablet TAKE ONE TABLET BY MOUTH TWICE DAILY WITH A MEAL  60 tablet  8  . digoxin (LANOXIN) 0.125 MG tablet TAKE ONE-HALF TABLET BY MOUTH ONCE DAILY  15 tablet  6  . furosemide (LASIX) 40 MG tablet TAKE ONE TABLET BY MOUTH DAILY  30 tablet  8  . hydrALAZINE (APRESOLINE) 25 MG tablet TAKE ONE AND ONE-HALF TABLETS BY MOUTH THREE TIMES DAILY  135 tablet  5  . isosorbide dinitrate (ISORDIL) 20 MG tablet TAKE ONE TABLET BY MOUTH THREE TIMES DAILY  90 tablet  7  . spironolactone (ALDACTONE) 25 MG tablet TAKE ONE-HALF TABLET (12.5 MG) BY MOUTH TWICE DAILY  30 tablet  8   No current facility-administered medications for this visit.    Socially he is single. He is retired. He works in [employer]. Completed 12th grade education. There is no tobacco history. He does drink occasional alcohol  ROS is negative for fevers, chills or night sweats. He denies visual symptoms.  There are no hearing changes. He is unaware lymphadenopathy. He denies cough, wheezing, or increased sputum production. He denies PND or orthopnea. He denies presyncope or syncope.  There is no chest pain. He does note some mild shortness of breath. He denies significant weight change. He denies abdominal pain. There is no melena or hematochezia. He denies GU symptoms. Denies history of feeling of swelling. There is no claudication. There is no diabetes. He denies cold or heat intolerance. He is unaware  of sleep disordered breathing. Other comprehensive 14 point system review is negative.  PE BP 130/82  Pulse 60  Ht 5\' 6"  (1.676 m)  Wt 173 lb 8 oz (78.699 kg)  BMI 28.02 kg/m2  Repeat blood pressure by me 130/82 General: Alert, oriented, no distress.  Skin: normal turgor, no rashes HEENT: Normocephalic, atraumatic. Pupils round and reactive; sclera anicteric;no lid lag.  Nose without nasal septal hypertrophy Mouth/Parynx benign; Mallinpatti scale 3 Neck: No JVD, no carotid briuts; normal carotid upstrokes Lungs: clear to ausculatation and percussion; no wheezing or rales Chest: No tenderness to palpation Heart: RRR, s1 s2 normal no S3 gallop. 1/6 systolic murmur. No S3 gallop or S4 gallop Abdomen: soft, nontender; no hepatosplenomehaly, BS+; abdominal aorta nontender and not dilated by palpation. Back: No CVA tenderness Pulses 2+ Extremities: no clubbing cyanosis or edema, Homan's sign negative  Neurologic: grossly nonfocal; cranial nerves grossly normal. Psychological: Normal affect and mood  ECG (independently read by me): Normal sinus rhythm at 60 beats per minute. Normal intervals.  Previous ECG from 12/04/2012 : Sinus bradycardia 53 beats per minute period. PR interval 196; QTc interval 377 ms  LABS:  BMET    Component Value Date/Time   NA 139 12/08/2012 1000   K 4.3 12/08/2012 1000   CL 105 12/08/2012 1000   CO2 22 12/08/2012 1000   GLUCOSE 90 12/08/2012 1000   BUN 13 12/08/2012 1000   CREATININE 1.28 12/08/2012 1000   CREATININE 1.30 10/06/2011 0625   CALCIUM 9.4 12/08/2012 1000   GFRNONAA 58* 10/06/2011 0625   GFRAA 67* 10/06/2011 0625     Hepatic Function Panel     Component Value Date/Time   PROT 6.8 01/09/2013 1125   ALBUMIN 4.0 01/09/2013 1125   AST 44* 01/09/2013 1125   ALT 37 01/09/2013 1125   ALKPHOS 67 01/09/2013 1125   BILITOT 0.8 01/09/2013 1125   BILIDIR 0.3 01/09/2013 1125   IBILI 0.5 01/09/2013 1125     CBC    Component Value Date/Time   WBC 4.6 12/08/2012 1000   RBC  5.11 12/08/2012 1000   HGB 13.7 12/08/2012 1000   HCT 41.0 12/08/2012 1000   PLT 188 12/08/2012 1000   MCV 80.2 12/08/2012 1000   MCH 26.8 12/08/2012 1000   MCHC 33.4 12/08/2012 1000   RDW 15.1 12/08/2012 1000   LYMPHSABS 1.5 08/29/2007 1040   MONOABS 0.5 08/29/2007 1040   EOSABS 0.0 08/29/2007 1040   BASOSABS 0.0 08/29/2007 1040     BNP    Component Value Date/Time   PROBNP 1662.0* 10/06/2011 0625    Lipid Panel     Component Value Date/Time   CHOL 145 12/08/2012 1000     RADIOLOGY: No results found.    ASSESSMENT AND PLAN: Mr. Renfrew has a long-standing history of hypertension and a history of a nonischemic cardiomyopathy and presented in pulmonary edema after he had stopped his blood pressure medications for several months in May 2013. Since that time on medical therapy his ejection fraction  has improved from 20% to approximately 45% on his last echo in September 2013. He did have grade 1 diastolic dysfunction. He also had mild MR and mild AR. His Heart rhythm is stable. Presently, he is tolerating his current multi-medical regimen without side effects. His blood pressure is well-controlled today is 110/80 when rechecked by me. His weight remains constant at 173 which is a one-pound weight loss from his last visit. He's not having signs of heart failure. He does not appear to be having any cardiac arrhythmia. In light of his multiple medical regimen, I do feel it is prudent to recheck laboratory. In the fasting state we will check a CBC, cemented, digoxin level after he does not take his digoxin in the morning of the blood work, TSH, and lipid studies. In 6 months, I am recommending that he undergo a two-year followup echo Doppler study to reassess his LV function, chamber dimensions and valvular architecture. We again discussed importance of medication compliance, increased activity and sodium restriction. I will see him following his echo Doppler study in 6 months and further recommendations were  made at that time.  Time spent: 25 minutes  Lennette Bihari, MD, [facility]  05/20/2013 4:32 PM

## 2013-06-02 LAB — TSH: TSH: 3.646 u[IU]/mL (ref 0.350–4.500)

## 2013-06-02 LAB — CBC
HCT: 40.8 % (ref 39.0–52.0)
Hemoglobin: 14.1 g/dL (ref 13.0–17.0)
MCH: 26.6 pg (ref 26.0–34.0)
MCHC: 34.6 g/dL (ref 30.0–36.0)
MCV: 77 fL — AB (ref 78.0–100.0)
Platelets: 197 10*3/uL (ref 150–400)
RBC: 5.3 MIL/uL (ref 4.22–5.81)
RDW: 14.9 % (ref 11.5–15.5)
WBC: 4.6 10*3/uL (ref 4.0–10.5)

## 2013-06-02 LAB — LIPID PANEL
Cholesterol: 157 mg/dL (ref 0–200)
HDL: 35 mg/dL — AB (ref >39)
LDL Cholesterol: 98 mg/dL (ref 0–99)
Total CHOL/HDL Ratio: 4.5 Ratio
Triglycerides: 119 mg/dL (ref <150)
VLDL: 24 mg/dL (ref 0–40)

## 2013-06-02 LAB — COMPREHENSIVE METABOLIC PANEL
ALT: 32 U/L (ref 0–53)
AST: 40 U/L — ABNORMAL HIGH (ref 0–37)
Albumin: 3.9 g/dL (ref 3.5–5.2)
Alkaline Phosphatase: 72 U/L (ref 39–117)
BUN: 14 mg/dL (ref 6–23)
CALCIUM: 9.6 mg/dL (ref 8.4–10.5)
CHLORIDE: 103 meq/L (ref 96–112)
CO2: 26 mEq/L (ref 19–32)
CREATININE: 1.38 mg/dL — AB (ref 0.50–1.35)
Glucose, Bld: 87 mg/dL (ref 70–99)
Potassium: 4.2 mEq/L (ref 3.5–5.3)
Sodium: 140 mEq/L (ref 135–145)
Total Bilirubin: 0.7 mg/dL (ref 0.3–1.2)
Total Protein: 7 g/dL (ref 6.0–8.3)

## 2013-06-03 LAB — DIGOXIN LEVEL: DIGOXIN LVL: 0.2 ng/mL — AB (ref 0.8–2.0)

## 2013-08-07 ENCOUNTER — Emergency Department (HOSPITAL_COMMUNITY)
Admission: EM | Admit: 2013-08-07 | Discharge: 2013-08-07 | Disposition: A | Discharge status: Home / Self Care | Payer: Medicaid Other | Attending: Emergency Medicine | Admitting: Emergency Medicine

## 2013-08-07 ENCOUNTER — Encounter (HOSPITAL_COMMUNITY): Payer: Self-pay | Admitting: Emergency Medicine

## 2013-08-07 ENCOUNTER — Emergency Department (HOSPITAL_COMMUNITY): Payer: Medicaid Other

## 2013-08-07 DIAGNOSIS — R001 Bradycardia, unspecified: Secondary | ICD-10-CM

## 2013-08-07 DIAGNOSIS — Z8709 Personal history of other diseases of the respiratory system: Secondary | ICD-10-CM | POA: Insufficient documentation

## 2013-08-07 DIAGNOSIS — I10 Essential (primary) hypertension: Secondary | ICD-10-CM | POA: Insufficient documentation

## 2013-08-07 DIAGNOSIS — Z87891 Personal history of nicotine dependence: Secondary | ICD-10-CM | POA: Insufficient documentation

## 2013-08-07 DIAGNOSIS — R569 Unspecified convulsions: Secondary | ICD-10-CM | POA: Insufficient documentation

## 2013-08-07 DIAGNOSIS — I5020 Unspecified systolic (congestive) heart failure: Secondary | ICD-10-CM | POA: Insufficient documentation

## 2013-08-07 DIAGNOSIS — I959 Hypotension, unspecified: Secondary | ICD-10-CM | POA: Insufficient documentation

## 2013-08-07 DIAGNOSIS — Z8719 Personal history of other diseases of the digestive system: Secondary | ICD-10-CM | POA: Insufficient documentation

## 2013-08-07 DIAGNOSIS — I498 Other specified cardiac arrhythmias: Secondary | ICD-10-CM | POA: Insufficient documentation

## 2013-08-07 DIAGNOSIS — R112 Nausea with vomiting, unspecified: Secondary | ICD-10-CM | POA: Insufficient documentation

## 2013-08-07 DIAGNOSIS — Z79899 Other long term (current) drug therapy: Secondary | ICD-10-CM | POA: Insufficient documentation

## 2013-08-07 DIAGNOSIS — R55 Syncope and collapse: Secondary | ICD-10-CM | POA: Insufficient documentation

## 2013-08-07 LAB — BASIC METABOLIC PANEL
BUN: 16 mg/dL (ref 6–23)
CO2: 23 mEq/L (ref 19–32)
Calcium: 9.4 mg/dL (ref 8.4–10.5)
Chloride: 103 mEq/L (ref 96–112)
Creatinine, Ser: 1.43 mg/dL — ABNORMAL HIGH (ref 0.50–1.35)
GFR, EST AFRICAN AMERICAN: 59 mL/min — AB (ref >90)
GFR, EST NON AFRICAN AMERICAN: 51 mL/min — AB (ref >90)
Glucose, Bld: 100 mg/dL — ABNORMAL HIGH (ref 70–99)
POTASSIUM: 4.1 meq/L (ref 3.7–5.3)
SODIUM: 140 meq/L (ref 137–147)

## 2013-08-07 LAB — CBC WITH DIFFERENTIAL/PLATELET
BASOS PCT: 1 % (ref 0–1)
Basophils Absolute: 0 10*3/uL (ref 0.0–0.1)
Eosinophils Absolute: 0.1 10*3/uL (ref 0.0–0.7)
Eosinophils Relative: 1 % (ref 0–5)
HCT: 39.4 % (ref 39.0–52.0)
HEMOGLOBIN: 13.8 g/dL (ref 13.0–17.0)
LYMPHS ABS: 2.4 10*3/uL (ref 0.7–4.0)
Lymphocytes Relative: 39 % (ref 12–46)
MCH: 27 pg (ref 26.0–34.0)
MCHC: 35 g/dL (ref 30.0–36.0)
MCV: 77.1 fL — ABNORMAL LOW (ref 78.0–100.0)
MONOS PCT: 9 % (ref 3–12)
Monocytes Absolute: 0.6 10*3/uL (ref 0.1–1.0)
NEUTROS PCT: 50 % (ref 43–77)
Neutro Abs: 3.1 10*3/uL (ref 1.7–7.7)
PLATELETS: 185 10*3/uL (ref 150–400)
RBC: 5.11 MIL/uL (ref 4.22–5.81)
RDW: 14.2 % (ref 11.5–15.5)
WBC: 6.3 10*3/uL (ref 4.0–10.5)

## 2013-08-07 LAB — URINALYSIS, ROUTINE W REFLEX MICROSCOPIC
Bilirubin Urine: NEGATIVE
Glucose, UA: NEGATIVE mg/dL
HGB URINE DIPSTICK: NEGATIVE
Ketones, ur: NEGATIVE mg/dL
LEUKOCYTES UA: NEGATIVE
NITRITE: NEGATIVE
PROTEIN: NEGATIVE mg/dL
SPECIFIC GRAVITY, URINE: 1.014 (ref 1.005–1.030)
UROBILINOGEN UA: 2 mg/dL — AB (ref 0.0–1.0)
pH: 6 (ref 5.0–8.0)

## 2013-08-07 LAB — TROPONIN I: Troponin I: <0.3 ng/mL (ref <0.30)

## 2013-08-07 MED ORDER — SODIUM CHLORIDE 0.9 % IV BOLUS (SEPSIS)
500.0000 mL | Freq: Once | INTRAVENOUS | Status: AC
  Administered 2013-08-07: 500 mL via INTRAVENOUS

## 2013-08-07 MED ORDER — SODIUM CHLORIDE 0.9 % IV SOLN: INTRAVENOUS | Status: DC

## 2013-08-07 NOTE — ED Notes (Signed)
Pt from barber shop. Witnessed syncopal/seizure like activity. Per ems, barber witnessed syncopal episode with some seizure like activity. Pt vomitted on self. Per EMS, pt was alert and oriented on there arrival, bradycardic,diaphoretic and cold.HR mid 40s

## 2013-08-07 NOTE — ED Provider Notes (Signed)
CSN: [id number]     Arrival date & time 08/07/13  1403 History   First MD Initiated Contact with Patient 08/07/13 1418     Chief Complaint  Patient presents with  . Seizures  . Bradycardia     (Consider location/radiation/quality/duration/timing/severity/associated sxs/prior Treatment) Patient is a 63 y.o. male presenting with seizures. The history is provided by the patient.  Seizures  He was in the barber chair, today, when he suddenly passed out. Bystander, noted shaking like seizure, and subsequent vomiting. When EMS arrived, he was alert. He was treated with IV fluids, and transportation. The patient feels nauseated now, but denies pain. He did not have any preceding symptoms. He, states that he ate a little bit of lunch today, and no breakfast. He is taking his usual medications, as prescribed. He denies history of CVA, or heart attack. He has not seen his doctor recently. There are no other known modifying factors.  Past Medical History  Diagnosis Date  . Hypertension   . Acute pulmonary edema 10/04/2011  . LVH (left ventricular hypertrophy) 10/04/2011  . Systolic CHF 11/07/2011    Hospitalized for flash pulmonary edema in June 2012 due to malignant HTN.   EF was 20-25% with severe global hypokinesis. left heart cath which revealed an LAD with 20% smooth narrowing but otherwise normal coronaries. On ace inhibitor, spironalactone, carvedilol. Diuresed in hospital.  Life Vest was ordered and fitted by [facility] to wear until September.     Marland Kitchen GERD (gastroesophageal reflux disease) 10/04/2011   Past Surgical History  Procedure Laterality Date  . Dental surgery    . Cholecystectomy, laparoscopic  2006  . Cholecystectomy     Family History  Problem Relation Age of Onset  . Kidney disease Mother     57  . Stroke Mother   . Diabetes type II Mother     Patient uncertain but history of multiple amuputations.   . Heart attack Father     2007-died of Heart attack @ age 74   History   Substance Use Topics  . Smoking status: Former Smoker    Types: Cigarettes    Quit date: 05/07/1973  . Smokeless tobacco: Never Used     Comment: on 11/07/11 patient states never smoker.   . Alcohol Use: No     Comment: no longer drinks since being sick.     Review of Systems  Neurological: Positive for seizures.  All other systems reviewed and are negative.      Allergies  Review of patient's allergies indicates no known allergies.  Home Medications   Current Outpatient Rx  Name  Route  Sig  Dispense  Refill  . benazepril (LOTENSIN) 40 MG tablet   Oral   Take 40 mg by mouth daily.         . carvedilol (COREG) 12.5 MG tablet   Oral   Take 12.5 mg by mouth 2 (two) times daily with a meal.         . digoxin (LANOXIN) 0.125 MG tablet   Oral   Take 0.625 mg by mouth daily.         . furosemide (LASIX) 40 MG tablet   Oral   Take 40 mg by mouth daily.         . isosorbide dinitrate (ISORDIL) 20 MG tablet   Oral   Take 20 mg by mouth 3 (three) times daily.         Marland Kitchen spironolactone (ALDACTONE) 25 MG tablet  Oral   Take 12.5 mg by mouth 2 (two) times daily.         . hydrALAZINE (APRESOLINE) 25 MG tablet   Oral   Take 37.5 mg by mouth 3 (three) times daily.          BP 112/76  Pulse 56  Temp(Src) 98.1 F (36.7 C) (Oral)  Resp 17  SpO2 98% Physical Exam  Nursing note and vitals reviewed. Constitutional: He is oriented to person, place, and time. He appears well-developed and well-nourished.  HENT:  Head: Normocephalic and atraumatic.  Right Ear: External ear normal.  Left Ear: External ear normal.  Eyes: Conjunctivae and EOM are normal. Pupils are equal, round, and reactive to light.  Neck: Normal range of motion and phonation normal. Neck supple.  Cardiovascular: Normal rate, regular rhythm, normal heart sounds and intact distal pulses.   Pulmonary/Chest: Effort normal and breath sounds normal. He exhibits no bony tenderness.  Abdominal:  Soft. There is no tenderness.  Musculoskeletal: Normal range of motion.  Neurological: He is alert and oriented to person, place, and time. No cranial nerve deficit or sensory deficit. He exhibits normal muscle tone. Coordination normal.  No dysarthria, aphasia or nystagmus. He speaks in a somewhat unusual, halting fashion.  Skin: Skin is warm, dry and intact.  Psychiatric: He has a normal mood and affect. His behavior is normal. Judgment and thought content normal.    ED Course  Procedures (including critical care time)  Medications  0.9 %  sodium chloride infusion (not administered)  sodium chloride 0.9 % bolus 500 mL (500 mLs Intravenous New Bag/Given 08/07/13 1502)    Patient Vitals for the past 24 hrs:  BP Temp Temp src Pulse Resp SpO2  08/07/13 1500 112/76 mmHg - - 56 17 98 %  08/07/13 1430 113/62 mmHg - - 54 17 97 %  08/07/13 1415 110/73 mmHg - - 52 24 97 %  08/07/13 1412 117/68 mmHg 98.1 F (36.7 C) Oral - 19 98 %  08/07/13 1403 - - - - - 100 %    15:45- Reevaluation with update and discussion. After initial assessment and treatment, an updated evaluation reveals he feels better, vital signs are normalized. Staphany Ditton L   Oral fluid trial/ambulation trial- Passed  Both   Date: 08/07/13  Rate: 52  Rhythm: normal sinus rhythm  QRS Axis: normal  PR and QT Intervals: normal  ST/T Wave abnormalities: normal  PR and QRS Conduction Disutrbances:none  Narrative Interpretation:   Old EKG Reviewed: unchanged   Labs Review Labs Reviewed  CBC WITH DIFFERENTIAL - Abnormal; Notable for the following:    MCV 77.1 (*)    All other components within normal limits  BASIC METABOLIC PANEL - Abnormal; Notable for the following:    Glucose, Bld 100 (*)    Creatinine, Ser 1.43 (*)    GFR calc non Af Amer 51 (*)    GFR calc Af Amer 59 (*)    All other components within normal limits  URINE CULTURE  TROPONIN I  URINALYSIS, ROUTINE W REFLEX MICROSCOPIC   Imaging Review Dg Chest  Port 1 View  08/07/2013   CLINICAL DATA:  Fall episode  EXAM: PORTABLE CHEST - 1 VIEW  COMPARISON:  10/07/2011  FINDINGS: Cardiac shadow is mildly enlarged. The lungs are well aerated bilaterally. No focal infiltrate or sizable effusion is seen. No acute bony abnormality is noted.  IMPRESSION: No active disease.   Electronically Signed   By: Eulah PontMark  Lukens M.D.  On: 08/07/2013 14:51     EKG Interpretation   Date/Time:  Friday August 07 2013 14:08:36 EDT Ventricular Rate:  52 PR Interval:  204 QRS Duration: 98 QT Interval:  403 QTC Calculation: 375 R Axis:   39 Text Interpretation:  Sinus rhythm Confirmed by Effie Shy  MD, Zamara Cozad ([id number])  on 08/07/2013 4:24:52 PM      MDM   Final diagnoses:  Syncope  Bradycardia  Hypotension    Syncope with subsequent vomiting, and bradycardia, likely related to vasovagal etiology. Screening medical evaluation negative for acute problems. Doubt ACS, PE, serious bacterial infection or impending vascular collapse.  Nursing Notes Reviewed/ Care Coordinated Applicable Imaging Reviewed Interpretation of Laboratory Data incorporated into ED treatment  The patient appears reasonably screened and/or stabilized for discharge and I doubt any other medical condition or other [facility] requiring further screening, evaluation, or treatment in the ED at this time prior to discharge.  Plan: Home Medications-usual; Home Treatments- rest, fluids, eat regularly; return here if the recommended treatment, does not improve the symptoms; Recommended follow up- PCP 1 week    Flint Melter, MD 08/07/13 1625

## 2013-08-07 NOTE — Discharge Instructions (Signed)
Eat 3 regular meals, each day. Get plenty of rest, and drink a lot of fluids. See your doctor for checkup in one week.    Syncope Syncope is a fainting spell. This means the person loses consciousness and drops to the ground. The person is generally unconscious for less than 5 minutes. The person may have some muscle twitches for up to 15 seconds before waking up and returning to normal. Syncope occurs more often in elderly people, but it can happen to anyone. While most causes of syncope are not dangerous, syncope can be a sign of a serious medical problem. It is important to seek medical care.  CAUSES  Syncope is caused by a sudden decrease in blood flow to the brain. The specific cause is often not determined. Factors that can trigger syncope include:  Taking medicines that lower blood pressure.  Sudden changes in posture, such as standing up suddenly.  Taking more medicine than prescribed.  Standing in one place for too long.  Seizure disorders.  Dehydration and excessive exposure to heat.  Low blood sugar (hypoglycemia).  Straining to have a bowel movement.  Heart disease, irregular heartbeat, or other circulatory problems.  Fear, emotional distress, seeing blood, or severe pain. SYMPTOMS  Right before fainting, you may:  Feel dizzy or lightheaded.  Feel nauseous.  See all white or all black in your field of vision.  Have cold, clammy skin. DIAGNOSIS  Your caregiver will ask about your symptoms, perform a physical exam, and perform electrocardiography (ECG) to record the electrical activity of your heart. Your caregiver may also perform other heart or blood tests to determine the cause of your syncope. TREATMENT  In most cases, no treatment is needed. Depending on the cause of your syncope, your caregiver may recommend changing or stopping some of your medicines. HOME CARE INSTRUCTIONS  Have someone stay with you until you feel stable.  Do not drive, operate  machinery, or play sports until your caregiver says it is okay.  Keep all follow-up appointments as directed by your caregiver.  Lie down right away if you start feeling like you might faint. Breathe deeply and steadily. Wait until all the symptoms have passed.  Drink enough fluids to keep your urine clear or pale yellow.  If you are taking blood pressure or heart medicine, get up slowly, taking several minutes to sit and then stand. This can reduce dizziness. SEEK IMMEDIATE MEDICAL CARE IF:   You have a severe headache.  You have unusual pain in the chest, abdomen, or back.  You are bleeding from the mouth or rectum, or you have black or tarry stool.  You have an irregular or very fast heartbeat.  You have pain with breathing.  You have repeated fainting or seizure-like jerking during an episode.  You faint when sitting or lying down.  You have confusion.  You have difficulty walking.  You have severe weakness.  You have vision problems. If you fainted, call your local emergency services (911 in [place] Do not drive yourself to the hospital.  MAKE SURE YOU:  Understand these instructions.  Will watch your condition.  Will get help right away if you are not doing well or get worse. Document Released: 04/23/2005 Document Revised: 10/23/2011 Document Reviewed: 06/22/2011 [facility] Patient Information 2014 [place], [place].  Hypotension As your heart beats, it forces blood through your arteries. This force is your blood pressure. If your blood pressure is too low for you to go about your normal activities or  to support the organs of your body, you have hypotension. Hypotension is also referred to as low blood pressure. When your blood pressure becomes too low, you may not get enough blood to your brain. As a result, you may feel weak, feel lightheaded, or develop a rapid heart rate. In a more severe case, you may faint. CAUSES Various conditions can cause hypotension. These  include:  Blood loss.  Dehydration.  Heart or endocrine problems.  Pregnancy.  Severe infection.  Not having a well-balanced diet filled with needed nutrients.  Severe allergic reactions (anaphylaxis). Some medicines, such as blood pressure medicine or water pills (diuretics), may lower your blood pressure below normal. Sometimes taking too much medicine or taking medicine not as directed can cause hypotension. TREATMENT  Hospitalization is sometimes required for hypotension if fluid or blood replacement is needed, if time is needed for medicines to wear off, or if further monitoring is needed. Treatment might include changing your diet, changing your medicines (including medicines aimed at raising your blood pressure), and use of support stockings. HOME CARE INSTRUCTIONS   Drink enough fluids to keep your urine clear or pale yellow.  Take your medicines as directed by your health care provider.  Get up slowly from reclining or sitting positions. This gives your blood pressure a chance to adjust.  Wear support stockings as directed by your health care provider.  Maintain a healthy diet by including nutritious food, such as fruits, vegetables, nuts, whole grains, and lean meats. SEEK MEDICAL CARE IF:  You have vomiting or diarrhea.  You have a fever for more than 2 3 days.  You feel more thirsty than usual.  You feel weak and tired. SEEK IMMEDIATE MEDICAL CARE IF:   You have chest pain or a fast or irregular heartbeat.  You have a loss of feeling in some part of your body, or you lose movement in your arms or legs.  You have trouble speaking.  You become sweaty or feel lightheaded.  You faint. MAKE SURE YOU:   Understand these instructions.  Will watch your condition.  Will get help right away if you are not doing well or get worse. Document Released: 04/23/2005 Document Revised: 02/11/2013 Document Reviewed: 10/24/2012 [facility] Patient Information 2014  [place], [place].  Bradycardia Bradycardia is a term for a heart rate (pulse) that, in adults, is slower than 60 beats per minute. A normal rate is 60 to 100 beats per minute. A heart rate below 60 beats per minute may be normal for some adults with healthy hearts. If the rate is too slow, the heart may have trouble pumping the volume of blood the body needs. If the heart rate gets too low, blood flow to the brain may be decreased and may make you feel lightheaded, dizzy, or faint. The heart has a natural pacemaker in the top of the heart called the SA node (sinoatrial or sinus node). This pacemaker sends out regular electrical signals to the muscle of the heart, telling the heart muscle when to beat (contract). The electrical signal travels from the upper parts of the heart (atria) through the AV node (atrioventricular node), to the lower chambers of the heart (ventricles). The ventricles squeeze, pumping the blood from your heart to your lungs and to the rest of your body. CAUSES   Problem with the heart's electrical system.  Problem with the heart's natural pacemaker.  Heart disease, damage, or infection.  Medications.  Problems with minerals and salts (electrolytes). SYMPTOMS   Fainting (  syncope).  Fatigue and weakness.  Shortness of breath (dyspnea).  Chest pain (angina).  Drowsiness.  Confusion. DIAGNOSIS   An electrocardiogram (ECG) can help your caregiver determine the type of slow heart rate you have.  If the cause is not seen on an ECG, you may need to wear a heart monitor that records your heart rhythm for several hours or days.  Blood tests. TREATMENT   Electrolyte supplements.  Medications.  Withholding medication which is causing a slow heart rate.  Pacemaker placement. SEEK IMMEDIATE MEDICAL CARE IF:   You feel lightheaded or faint.  You develop an irregular heart rate.  You feel chest pain or have trouble breathing. MAKE SURE YOU:   Understand these  instructions.  Will watch your condition.  Will get help right away if you are not doing well or get worse. Document Released: 01/13/2002 Document Revised: 07/16/2011 Document Reviewed: 12/10/2007 [facility] Patient Information 2014 [place], [place].

## 2013-08-07 NOTE — ED Notes (Signed)
Pt ambulated without difficulty. Dr. Effie Shy notified.

## 2013-08-08 LAB — URINE CULTURE

## 2013-08-10 ENCOUNTER — Other Ambulatory Visit: Payer: Self-pay | Admitting: Physician Assistant

## 2013-08-10 NOTE — Telephone Encounter (Signed)
Rx was sent to pharmacy electronically. 

## 2013-11-19 ENCOUNTER — Telehealth: Payer: Self-pay | Admitting: Cardiovascular Disease

## 2013-11-19 NOTE — Telephone Encounter (Signed)
Pt did not need to leave a message. °

## 2013-12-07 ENCOUNTER — Other Ambulatory Visit: Payer: Self-pay | Admitting: Physician Assistant

## 2013-12-07 NOTE — Telephone Encounter (Signed)
Rx was sent to pharmacy electronically. 

## 2013-12-08 ENCOUNTER — Other Ambulatory Visit: Payer: Self-pay | Admitting: *Deleted

## 2013-12-08 MED ORDER — DIGOXIN 125 MCG PO TABS: 0.6250 mg | ORAL_TABLET | Freq: Every day | ORAL | Status: DC

## 2013-12-08 MED ORDER — DIGOXIN 125 MCG PO TABS: 0.0625 mg | ORAL_TABLET | Freq: Every day | ORAL | Status: DC

## 2013-12-08 NOTE — Telephone Encounter (Signed)
Rx was sent to pharmacy electronically. 

## 2014-01-15 ENCOUNTER — Other Ambulatory Visit: Payer: Self-pay | Admitting: Cardiovascular Disease

## 2014-01-18 NOTE — Telephone Encounter (Signed)
Rx was sent to pharmacy electronically. 

## 2014-01-26 ENCOUNTER — Ambulatory Visit (HOSPITAL_COMMUNITY)
Admission: RE | Admit: 2014-01-26 | Discharge: 2014-01-26 | Disposition: A | Discharge status: Home / Self Care | Payer: Medicaid Other | Source: Ambulatory Visit | Attending: Cardiovascular Disease | Admitting: Cardiovascular Disease

## 2014-01-26 DIAGNOSIS — I429 Cardiomyopathy, unspecified: Secondary | ICD-10-CM | POA: Insufficient documentation

## 2014-01-26 DIAGNOSIS — E785 Hyperlipidemia, unspecified: Secondary | ICD-10-CM | POA: Diagnosis not present

## 2014-01-26 DIAGNOSIS — I509 Heart failure, unspecified: Secondary | ICD-10-CM | POA: Diagnosis not present

## 2014-01-26 DIAGNOSIS — I080 Rheumatic disorders of both mitral and aortic valves: Secondary | ICD-10-CM | POA: Diagnosis not present

## 2014-01-26 DIAGNOSIS — I359 Nonrheumatic aortic valve disorder, unspecified: Secondary | ICD-10-CM

## 2014-01-26 DIAGNOSIS — I77819 Aortic ectasia, unspecified site: Secondary | ICD-10-CM | POA: Diagnosis not present

## 2014-01-26 DIAGNOSIS — I10 Essential (primary) hypertension: Secondary | ICD-10-CM | POA: Diagnosis not present

## 2014-01-26 DIAGNOSIS — I428 Other cardiomyopathies: Secondary | ICD-10-CM

## 2014-01-26 NOTE — Progress Notes (Signed)
2D Echo Performed 01/26/2014    Billy Turvey, RCS  

## 2014-02-02 ENCOUNTER — Ambulatory Visit (INDEPENDENT_AMBULATORY_CARE_PROVIDER_SITE_OTHER): Payer: Medicaid Other | Admitting: Cardiovascular Disease

## 2014-02-02 ENCOUNTER — Encounter: Payer: Self-pay | Admitting: Cardiovascular Disease

## 2014-02-02 VITALS — BP 100/78 | HR 71 | Ht 66.0 in | Wt 170.3 lb

## 2014-02-02 DIAGNOSIS — R5381 Other malaise: Secondary | ICD-10-CM

## 2014-02-02 DIAGNOSIS — R5383 Other fatigue: Secondary | ICD-10-CM

## 2014-02-02 DIAGNOSIS — I10 Essential (primary) hypertension: Secondary | ICD-10-CM

## 2014-02-02 DIAGNOSIS — I428 Other cardiomyopathies: Secondary | ICD-10-CM

## 2014-02-02 DIAGNOSIS — I517 Cardiomegaly: Secondary | ICD-10-CM

## 2014-02-02 NOTE — Patient Instructions (Signed)
Your physician recommends that you return for lab work fasting.  Your physician wants you to follow-up in: 6 months. You will receive a reminder letter in the mail two months in advance. If you don't receive a letter, please call our office to schedule the follow-up appointment.

## 2014-02-02 NOTE — Progress Notes (Signed)
Patient ID: Thomas LeavensSherman J Kollmann, male   DOB: 07/17/1950, 63 y.o.   MRN: [id number]      HPI: Thomas Bullock is a 63 y.o. male with a history of a nonischemic cardiomyopathy who presents to the office for an 5669-month cardiology evaluation.  [name] a long-standing history of hypertension. In May 2013 after being off his medications for some time he presented [facility] in the setting of acute pulmonary edema. He was found to have a nonischemic cardiomyopathy with an ejection fraction of 20-25% and minimal CAD with 20% narrowing in the LAD noted at catheterization.With aggressive medical therapy with gradual titration of his medications  LV function has improved. An echo Doppler study in September 2013 showed an ejection fraction of 45%.  Over the past year he has continued to do well the he denies any significant shortness of breath but still does some notes a mild shortness of breath with activity. He denies any episodes of chest pain. He is unaware of any tachycardia palpitations presyncope or syncope or arrhythmia.  He has been on a medical regimen consisting of low-dose Lanoxin at 0.0625 mg daily, a Biedl regimen although he has been taking themedications independently with 20 mg bid of Isordil and 37.5 mg 3 times a day of hydralazine, spironolactone 12.5 mg twice a day furosemide 40 mg daily, carvedilol 12.5 mg twice a day, and Lotensin 40 mg daily.  He recently underwent a followup echo Doppler study on 01/26/2014.  This now shows complete normalization of LV function with an ejection fraction of 60-65%.  There was grade 1 diastolic dysfunction.  He had a determine LV filling pressure.  PA pressure was normal at 11 mm.  His  ascending aorta was moderately dilated up to 4.7 cm.   Past Medical History  Diagnosis Date  . Hypertension   . Acute pulmonary edema 10/04/2011  . LVH (left ventricular hypertrophy) 10/04/2011  . Systolic CHF 11/07/2011    Hospitalized for flash pulmonary  edema in June 2012 due to malignant HTN.   EF was 20-25% with severe global hypokinesis. left heart cath which revealed an LAD with 20% smooth narrowing but otherwise normal coronaries. On ace inhibitor, spironalactone, carvedilol. Diuresed in hospital.  Life Vest was ordered and fitted by [employer] to wear until September.     Marland Kitchen. GERD (gastroesophageal reflux disease) 10/04/2011    Past Surgical History  Procedure Laterality Date  . Dental surgery    . Cholecystectomy, laparoscopic  2006  . Cholecystectomy      No Known Allergies  Current Outpatient Prescriptions  Medication Sig Dispense Refill  . benazepril (LOTENSIN) 40 MG tablet Take 1 tablet (40 mg total) by mouth daily.  30 tablet  4  . carvedilol (COREG) 12.5 MG tablet TAKE ONE TABLET BY MOUTH TWICE DAILY WITH MEALS  60 tablet  5  . digoxin (LANOXIN) 0.125 MG tablet Take 0.5 tablets (0.0625 mg total) by mouth daily.  15 tablet  4  . furosemide (LASIX) 40 MG tablet TAKE ONE TABLET BY MOUTH ONCE DAILY  30 tablet  9  . hydrALAZINE (APRESOLINE) 25 MG tablet Take 37.5 mg by mouth 3 (three) times daily.      . isosorbide dinitrate (ISORDIL) 20 MG tablet Take 20 mg by mouth 3 (three) times daily.      Marland Kitchen. spironolactone (ALDACTONE) 25 MG tablet Take 12.5 mg by mouth 2 (two) times daily.       No current facility-administered medications for this visit.  Socially he is single. He is retired. He works in [employer]. Completed 12th grade education. There is no tobacco history. He does drink occasional alcohol.  ROS General: Negative; No fevers, chills, or night sweats;  HEENT: Negative; No changes in vision or hearing, sinus congestion, difficulty swallowing Pulmonary: Negative; No cough, wheezing, shortness of breath, hemoptysis Cardiovascular: See history of present illness; No chest pain, presyncope, syncope, palpitations GI: Negative; No nausea, vomiting, diarrhea, or abdominal pain GU: Negative; No dysuria, hematuria, or  difficulty voiding Musculoskeletal: Negative; no myalgias, joint pain, or weakness Hematologic/Oncology: Negative; no easy bruising, bleeding Endocrine: Negative; no heat/cold intolerance; no diabetes Neuro: Negative; no changes in balance, headaches Skin: Negative; No rashes or skin lesions Psychiatric: Negative; No behavioral problems, depression Sleep: Negative; No snoring, daytime sleepiness, hypersomnolence, bruxism, restless legs, hypnogognic hallucinations, no cataplexy Other comprehensive 14 point system review is negative.   PE BP 100/78  Pulse 71  Ht 5\' 6"  (1.676 m)  Wt 170 lb 4.8 oz (77.248 kg)  BMI 27.50 kg/m2  Repeat blood pressure by me 115/82 General: Alert, oriented, no distress.  Skin: normal turgor, no rashes HEENT: Normocephalic, atraumatic. Pupils round and reactive; sclera anicteric;no lid lag.  Nose without nasal septal hypertrophy Mouth/Parynx benign; Mallinpatti scale 3 Neck: No JVD, no carotid briuts; normal carotid upstrokes Lungs: clear to ausculatation and percussion; no wheezing or rales Chest: No tenderness to palpation Heart: RRR, s1 s2 normal no S3 gallop. 1/6 systolic murmur. No S3 gallop or S4 gallop Abdomen: soft, nontender; no hepatosplenomehaly, BS+; abdominal aorta nontender and not dilated by palpation. Back: No CVA tenderness Pulses 2+ Extremities: no clubbing cyanosis or edema, Homan's sign negative  Neurologic: grossly nonfocal; cranial nerves grossly normal. Psychological: Normal affect and mood  ECG (independently read by me): Normal sinus rhythm with one isolated PVC.  QTc interval 397 ms.  PR interval 190 ms.  Prior January 2015 ECG (independently read by me): Normal sinus rhythm at 60 beats per minute. Normal intervals.  Previous ECG from 12/04/2012 : Sinus bradycardia 53 beats per minute period. PR interval 196; QTc interval 377 ms  LABS:  BMET    Component Value Date/Time   NA 140 08/07/2013 1454   K 4.1 08/07/2013 1454   CL  103 08/07/2013 1454   CO2 23 08/07/2013 1454   GLUCOSE 100* 08/07/2013 1454   BUN 16 08/07/2013 1454   CREATININE 1.43* 08/07/2013 1454   CREATININE 1.38* 06/02/2013 1036   CALCIUM 9.4 08/07/2013 1454   GFRNONAA 51* 08/07/2013 1454   GFRAA 59* 08/07/2013 1454     Hepatic Function Panel     Component Value Date/Time   PROT 7.0 06/02/2013 1036   ALBUMIN 3.9 06/02/2013 1036   AST 40* 06/02/2013 1036   ALT 32 06/02/2013 1036   ALKPHOS 72 06/02/2013 1036   BILITOT 0.7 06/02/2013 1036   BILIDIR 0.3 01/09/2013 1125   IBILI 0.5 01/09/2013 1125     CBC    Component Value Date/Time   WBC 6.3 08/07/2013 1454   RBC 5.11 08/07/2013 1454   HGB 13.8 08/07/2013 1454   HCT 39.4 08/07/2013 1454   PLT 185 08/07/2013 1454   MCV 77.1* 08/07/2013 1454   MCH 27.0 08/07/2013 1454   MCHC 35.0 08/07/2013 1454   RDW 14.2 08/07/2013 1454   LYMPHSABS 2.4 08/07/2013 1454   MONOABS 0.6 08/07/2013 1454   EOSABS 0.1 08/07/2013 1454   BASOSABS 0.0 08/07/2013 1454     BNP    Component Value  Date/Time   PROBNP 1662.0* 10/06/2011 0625    Lipid Panel     Component Value Date/Time   CHOL 157 06/02/2013 1036     RADIOLOGY: No results found.    ASSESSMENT AND PLAN: Mr. Kester has a long-standing history of hypertension and a history of a nonischemic cardiomyopathy who presented in pulmonary edema after he had stopped his blood pressure medications for several months in May 2013. Since that time on medical therapy his ejection fraction has improved from 20% to now 60-65% as noted on his echo of 01/26/2014.  He does have moderate dilatation of his ascending aorta.  Pulmonary pressures are excellent.  11 mm.  He is unaware of any arrhythmia.  His blood pressure today is controlled.  He does note some mild shortness of breath with significant activity, which may be contributed by mild diastolic dysfunction.  Repeat blood work will be obtained in the fasting state.  Medication adjustments will be done if necessary.  We discussed the importance of  sodium restriction in the absolute importance that he maintain his medications since in the past.  He had severe decompensation without treatment.  I will see him in 6 months for cardiology reevaluation.  Time spent: 25 minutes  Lennette Bihari, MD, [facility]  02/03/2014 7:16 PM

## 2014-02-03 ENCOUNTER — Encounter: Payer: Self-pay | Admitting: Cardiovascular Disease

## 2014-02-10 LAB — COMPREHENSIVE METABOLIC PANEL
ALK PHOS: 83 U/L (ref 39–117)
ALT: 40 U/L (ref 0–53)
AST: 43 U/L — ABNORMAL HIGH (ref 0–37)
Albumin: 3.9 g/dL (ref 3.5–5.2)
BILIRUBIN TOTAL: 0.9 mg/dL (ref 0.2–1.2)
BUN: 15 mg/dL (ref 6–23)
CO2: 24 mEq/L (ref 19–32)
CREATININE: 1.47 mg/dL — AB (ref 0.50–1.35)
Calcium: 9.4 mg/dL (ref 8.4–10.5)
Chloride: 105 mEq/L (ref 96–112)
Glucose, Bld: 98 mg/dL (ref 70–99)
Potassium: 4.3 mEq/L (ref 3.5–5.3)
Sodium: 139 mEq/L (ref 135–145)
TOTAL PROTEIN: 7.3 g/dL (ref 6.0–8.3)

## 2014-02-10 LAB — CBC
HEMATOCRIT: 40.2 % (ref 39.0–52.0)
Hemoglobin: 14.3 g/dL (ref 13.0–17.0)
MCH: 26.4 pg (ref 26.0–34.0)
MCHC: 35.6 g/dL (ref 30.0–36.0)
MCV: 74.2 fL — AB (ref 78.0–100.0)
Platelets: 202 10*3/uL (ref 150–400)
RBC: 5.42 MIL/uL (ref 4.22–5.81)
RDW: 15.4 % (ref 11.5–15.5)
WBC: 5.4 10*3/uL (ref 4.0–10.5)

## 2014-02-10 LAB — LIPID PANEL
CHOLESTEROL: 149 mg/dL (ref 0–200)
HDL: 35 mg/dL — AB (ref >39)
LDL CALC: 86 mg/dL (ref 0–99)
TRIGLYCERIDES: 138 mg/dL (ref <150)
Total CHOL/HDL Ratio: 4.3 Ratio
VLDL: 28 mg/dL (ref 0–40)

## 2014-02-10 LAB — TSH: TSH: 1.95 u[IU]/mL (ref 0.350–4.500)

## 2014-02-11 ENCOUNTER — Telehealth: Payer: Self-pay | Admitting: *Deleted

## 2014-02-11 NOTE — Telephone Encounter (Signed)
Message copied by Gaynelle Cage on Thu Feb 11, 2014  3:18 PM ------      Message from: Nicki Guadalajara A      Created: Thu Feb 11, 2014  2:05 PM       Labs ok; microcytic indices, no sig change; Cr and AST mildly inc ------

## 2014-02-11 NOTE — Telephone Encounter (Signed)
called and notified patient of lab results.

## 2014-04-15 ENCOUNTER — Encounter (HOSPITAL_COMMUNITY): Payer: Self-pay | Admitting: Cardiovascular Disease

## 2014-04-19 ENCOUNTER — Other Ambulatory Visit: Payer: Self-pay | Admitting: Cardiovascular Disease

## 2014-04-19 NOTE — Telephone Encounter (Signed)
Rx has been sent to the pharmacy electronically. ° °

## 2014-06-09 ENCOUNTER — Other Ambulatory Visit: Payer: Self-pay | Admitting: Cardiovascular Disease

## 2014-07-02 ENCOUNTER — Other Ambulatory Visit: Payer: Self-pay | Admitting: Cardiovascular Disease

## 2014-07-06 ENCOUNTER — Other Ambulatory Visit: Payer: Self-pay | Admitting: Cardiovascular Disease

## 2014-07-06 NOTE — Telephone Encounter (Signed)
Rx refill sent to patient pharmacy   

## 2014-08-22 ENCOUNTER — Other Ambulatory Visit: Payer: Self-pay | Admitting: Cardiovascular Disease

## 2014-08-23 NOTE — Telephone Encounter (Signed)
Rx has been sent to the pharmacy electronically. ° °

## 2014-09-07 ENCOUNTER — Encounter: Payer: Self-pay | Admitting: Cardiovascular Disease

## 2014-09-07 ENCOUNTER — Ambulatory Visit (INDEPENDENT_AMBULATORY_CARE_PROVIDER_SITE_OTHER): Payer: Medicare Other | Admitting: Cardiovascular Disease

## 2014-09-07 VITALS — BP 98/74 | HR 75 | Ht 66.0 in | Wt 172.5 lb

## 2014-09-07 DIAGNOSIS — N183 Chronic kidney disease, stage 3 unspecified: Secondary | ICD-10-CM

## 2014-09-07 DIAGNOSIS — N189 Chronic kidney disease, unspecified: Secondary | ICD-10-CM

## 2014-09-07 DIAGNOSIS — I429 Cardiomyopathy, unspecified: Secondary | ICD-10-CM | POA: Diagnosis not present

## 2014-09-07 DIAGNOSIS — I10 Essential (primary) hypertension: Secondary | ICD-10-CM | POA: Diagnosis not present

## 2014-09-07 DIAGNOSIS — I428 Other cardiomyopathies: Secondary | ICD-10-CM

## 2014-09-07 DIAGNOSIS — R0602 Shortness of breath: Secondary | ICD-10-CM | POA: Diagnosis not present

## 2014-09-07 DIAGNOSIS — Z79899 Other long term (current) drug therapy: Secondary | ICD-10-CM

## 2014-09-07 MED ORDER — FUROSEMIDE 40 MG PO TABS: 20.0000 mg | ORAL_TABLET | Freq: Every day | ORAL | Status: DC

## 2014-09-07 NOTE — Patient Instructions (Signed)
Your physician has recommended you make the following change in your medication: decrease the furosemide to 20 mg daily ( 1/2 tablet).  Your physician recommends that you return for lab work in: 2 weeks.   Your physician recommends that you schedule a follow-up appointment in: 6 months or sooner if needed.

## 2014-09-08 ENCOUNTER — Encounter: Payer: Self-pay | Admitting: Cardiovascular Disease

## 2014-09-08 DIAGNOSIS — N183 Chronic kidney disease, stage 3 unspecified: Secondary | ICD-10-CM | POA: Insufficient documentation

## 2014-09-08 NOTE — Progress Notes (Signed)
Patient ID: Thomas Bullock, male   DOB: 02/27/1951, 64 y.o.   MRN: [id number]      HPI: Thomas Bullock is a 64 y.o. male with a history of a nonischemic cardiomyopathy who presents to the office for an 33-monthcardiology evaluation.  Thomas Bullock long-standing history of hypertension. In May 2013 after being off his medications for some time [facility]. He was found to have a nonischemic cardiomyopathy with an ejection fraction of 20-25% and minimal CAD with 20% narrowing in the LAD noted at catheterization.With aggressive medical therapy and gradual titration of his medications  LV function has improved. An echo Doppler study in September 2013 showed an ejection fraction of 45%.  His last echo Doppler study on 01/26/2014 revealed complete normalization of LV function with an ejection fraction of 60-65%.  There was grade 1 diastolic dysfunction.  He had a determine LV filling pressure.  PA pressure was normal at 11 mm.  His  ascending aorta was moderately dilated up to 4.7 cm.   Over the past year he has continued to do well the he denies any significant shortness of breath but still does some notes a mild shortness of breath with activity. He denies any episodes of chest pain. He is unaware of any palpitations, presyncope  syncope or arrhythmia.  He admits to occasional episodes of indigestion.  He denies any edema.  He denies PND, orthopnea.  He has been on a medical regimen consisting of low-dose Lanoxin at 0.0625 mg daily, Bidil regimen although he has been taking the medications individually with 20 mg bid of Isordil and 37.5 mg 3 times a day of hydralazine, spironolactone 12.5 mg twice a day, furosemide 40 mg daily, carvedilol 12.5 mg twice a day, and Lotensin 40 mg daily.   Past Medical History  Diagnosis Date  . Hypertension   . Acute pulmonary edema 10/04/2011  . LVH (left ventricular hypertrophy) 10/04/2011  . Systolic CHF  71/08/4313   Hospitalized for flash pulmonary edema in June 2012 due to malignant HTN.   EF was 20-25% with severe global hypokinesis. left heart cath which revealed an LAD with 20% smooth narrowing but otherwise normal coronaries. On ace inhibitor, spironalactone, carvedilol. Diuresed in hospital.  Life Vest was ordered and fitted by [facility] to wear until September.     .Marland KitchenGERD (gastroesophageal reflux disease) 10/04/2011  . Coronary artery disease     Past Surgical History  Procedure Laterality Date  . Dental surgery    . Cholecystectomy, laparoscopic  2006  . Cholecystectomy    . Left heart catheterization with coronary angiogram N/A 10/05/2011    Procedure: LEFT HEART CATHETERIZATION WITH CORONARY ANGIOGRAM;  Surgeon: TTroy Sine MD;  [facility];  Service: Cardiovascular;  Laterality: N/A;  . Right heart catheterization  10/05/2011    Procedure: RIGHT HEART CATH;  Surgeon: TTroy Sine MD;  [facility];  Service: Cardiovascular;;    No Known Allergies  Current Outpatient Prescriptions  Medication Sig Dispense Refill  . benazepril (LOTENSIN) 40 MG tablet TAKE ONE TABLET BY MOUTH ONCE DAILY 30 tablet 6  . carvedilol (COREG) 12.5 MG tablet TAKE ONE TABLET BY MOUTH TWICE DAILY WITH MEALS 60 tablet 5  . digoxin (LANOXIN) 0.125 MG tablet TAKE ONE-HALF TABLET (0.0625 MG) BY MOUTH DAILY 15 tablet 7  . furosemide (LASIX) 40 MG tablet Take 0.5 tablets (20 mg total) by mouth daily.  30 tablet 6  . hydrALAZINE (APRESOLINE) 25 MG tablet TAKE ONE & ONE-HALF TABLETS BY MOUTH THREE TIMES DAILY 135 tablet 6  . isosorbide dinitrate (ISORDIL) 20 MG tablet TAKE ONE TABLET BY MOUTH THREE TIMES DAILY. 90 tablet 9  . spironolactone (ALDACTONE) 25 MG tablet Take 12.5 mg by mouth 2 (two) times daily.     No current facility-administered medications for this visit.    Socially he is single. He is retired. He works in [employer]. Completed 12th grade education. There is no  tobacco history. He does drink occasional alcohol.  ROS General: Negative; No fevers, chills, or night sweats;  HEENT: Negative; No changes in vision or hearing, sinus congestion, difficulty swallowing Pulmonary: Negative; No cough, wheezing, shortness of breath, hemoptysis Cardiovascular: See history of present illness; No chest pain, presyncope, syncope, palpitations GI: Negative; No nausea, vomiting, diarrhea, or abdominal pain GU: Negative; No dysuria, hematuria, or difficulty voiding Musculoskeletal: Negative; no myalgias, joint pain, or weakness Hematologic/Oncology: Negative; no easy bruising, bleeding Endocrine: Negative; no heat/cold intolerance; no diabetes Neuro: Negative; no changes in balance, headaches Skin: Negative; No rashes or skin lesions Psychiatric: Negative; No behavioral problems, depression Sleep: Negative; No snoring, daytime sleepiness, hypersomnolence, bruxism, restless legs, hypnogognic hallucinations, no cataplexy Other comprehensive 14 point system review is negative.   PE BP 98/74 mmHg  Pulse 75  Ht '5\' 6"'  (1.676 m)  Wt 172 lb 8 oz (78.245 kg)  BMI 27.86 kg/m2   Wt Readings from Last 3 Encounters:  09/07/14 172 lb 8 oz (78.245 kg)  02/02/14 170 lb 4.8 oz (77.248 kg)  05/20/13 173 lb 8 oz (78.699 kg)   Repeat blood pressure by me 104/82 General: Alert, oriented, no distress.  Skin: normal turgor, no rashes HEENT: Normocephalic, atraumatic. Pupils round and reactive; sclera anicteric;no lid lag.  Nose without nasal septal hypertrophy Mouth/Parynx benign; Mallinpatti scale 3 Neck: No JVD, no carotid briuts; normal carotid upstrokes Lungs: clear to ausculatation and percussion; no wheezing or rales Chest: No tenderness to palpation Heart: RRR, s1 s2 normal no S3 gallop. 1/6 systolic murmur. No S3 gallop or S4 gallop; no diastolic murmur rubs thrills or heaves. Abdomen: soft, nontender; no hepatosplenomehaly, BS+; abdominal aorta nontender and not  dilated by palpation. Back: No CVA tenderness Pulses 2+ Extremities: no clubbing cyanosis or edema, Homan's sign negative  Neurologic: grossly nonfocal; cranial nerves grossly normal. Psychological: Normal affect and mood  ECG (independently read by me): Normal sinus rhythm at 75 bpm.  First-degree AV block with a PR interval 206 ms.  T-wave inversion in leads 3 and mildly in aVF.  September 2015 ECG (independently read by me): Normal sinus rhythm with one isolated PVC.  QTc interval 397 ms.  PR interval 190 ms.  Prior January 2015 ECG (independently read by me): Normal sinus rhythm at 60 beats per minute. Normal intervals.  Previous ECG from 12/04/2012 : Sinus bradycardia 53 beats per minute period. PR interval 196; QTc interval 377 ms  LABS: BMP Latest Ref Rng 02/10/2014 08/07/2013 06/02/2013  Glucose 70 - 99 mg/dL 98 100(H) 87  BUN 6 - 23 mg/dL '15 16 14  ' Creatinine 0.50 - 1.35 mg/dL 1.47(H) 1.43(H) 1.38(H)  Sodium 135 - 145 mEq/L 139 140 140  Potassium 3.5 - 5.3 mEq/L 4.3 4.1 4.2  Chloride 96 - 112 mEq/L 105 103 103  CO2 19 - 32 mEq/L '24 23 26  ' Calcium 8.4 - 10.5 mg/dL 9.4 9.4 9.6   Hepatic Function Latest Ref Rng 02/10/2014 06/02/2013  01/09/2013  Total Protein 6.0 - 8.3 g/dL 7.3 7.0 6.8  Albumin 3.5 - 5.2 g/dL 3.9 3.9 4.0  AST 0 - 37 U/L 43(H) 40(H) 44(H)  ALT 0 - 53 U/L 40 32 37  Alk Phosphatase 39 - 117 U/L 83 72 67  Total Bilirubin 0.2 - 1.2 mg/dL 0.9 0.7 0.8  Bilirubin, Direct 0.0 - 0.3 mg/dL - - 0.3   CBC Latest Ref Rng 02/10/2014 08/07/2013 06/02/2013  WBC 4.0 - 10.5 K/uL 5.4 6.3 4.6  Hemoglobin 13.0 - 17.0 g/dL 14.3 13.8 14.1  Hematocrit 39.0 - 52.0 % 40.2 39.4 40.8  Platelets 150 - 400 K/uL 202 185 197   Lab Results  Component Value Date   TSH 1.950 02/10/2014   Lipid Panel     Component Value Date/Time   CHOL 149 02/10/2014 1125   TRIG 138 02/10/2014 1125   HDL 35* 02/10/2014 1125   CHOLHDL 4.3 02/10/2014 1125   VLDL 28 02/10/2014 1125   LDLCALC 86 02/10/2014  1125   BNP    Component Value Date/Time   PROBNP 1662.0* 10/06/2011 0625     RADIOLOGY: No results found.    ASSESSMENT AND PLAN: Thomas Bullock has a long-standing history of hypertension and a history of a nonischemic cardiomyopathy who presented in pulmonary edema after he had stopped his blood pressure medications for several months in May 2013. Since that time on medical therapy his ejection fraction has improved from 20% to  60-65% as noted on his echo of 01/26/2014.  He does have moderate dilatation of his ascending aorta.  Pulmonary pressures are normal.  He is unaware of any arrhythmia.  His blood pressure today is slightly low at 98/74 initially.  He does not have any signs of edema.  He will continue his current medical regimen as stated above, but I will decrease his Lasix from 40 mg daily to 20 mg.  In 2 weeks.  He will undergo a CMP, and BNP for further evaluation.  Clinically, he appears euvolemic.  I reviewed his laboratory from October 2015.  He has mild renal insufficiency, stage III.  There was very minimal transaminase elevation with an AST of 43.  Adjustments will be made accordingly following his blood work if necessary.  I will see him in 6 months for reevaluation.  Time spent: 25 minutes  Troy Sine, MD, [facility]  09/08/2014 7:02 PM

## 2014-09-09 ENCOUNTER — Other Ambulatory Visit: Payer: Self-pay | Admitting: *Deleted

## 2014-09-09 ENCOUNTER — Other Ambulatory Visit: Payer: Self-pay | Admitting: Cardiovascular Disease

## 2014-09-09 MED ORDER — CARVEDILOL 12.5 MG PO TABS: 12.5000 mg | ORAL_TABLET | Freq: Two times a day (BID) | ORAL | Status: DC

## 2014-09-28 DIAGNOSIS — R0602 Shortness of breath: Secondary | ICD-10-CM | POA: Diagnosis not present

## 2014-09-28 DIAGNOSIS — Z79899 Other long term (current) drug therapy: Secondary | ICD-10-CM | POA: Diagnosis not present

## 2014-09-29 LAB — COMPREHENSIVE METABOLIC PANEL
ALT: 32 U/L (ref 0–53)
AST: 41 U/L — ABNORMAL HIGH (ref 0–37)
Albumin: 3.8 g/dL (ref 3.5–5.2)
Alkaline Phosphatase: 79 U/L (ref 39–117)
BUN: 12 mg/dL (ref 6–23)
CO2: 23 mEq/L (ref 19–32)
Calcium: 9.4 mg/dL (ref 8.4–10.5)
Chloride: 105 mEq/L (ref 96–112)
Creat: 1.21 mg/dL (ref 0.50–1.35)
GLUCOSE: 90 mg/dL (ref 70–99)
POTASSIUM: 4.3 meq/L (ref 3.5–5.3)
SODIUM: 137 meq/L (ref 135–145)
Total Bilirubin: 1 mg/dL (ref 0.2–1.2)
Total Protein: 7.2 g/dL (ref 6.0–8.3)

## 2014-09-29 LAB — BRAIN NATRIURETIC PEPTIDE: BRAIN NATRIURETIC PEPTIDE: 58.5 pg/mL (ref 0.0–100.0)

## 2014-10-01 ENCOUNTER — Encounter: Payer: Self-pay | Admitting: *Deleted

## 2014-10-21 ENCOUNTER — Other Ambulatory Visit: Payer: Self-pay | Admitting: Cardiovascular Disease

## 2014-10-21 NOTE — Telephone Encounter (Signed)
Rx has been sent to the pharmacy electronically. ° °

## 2015-02-18 ENCOUNTER — Other Ambulatory Visit: Payer: Self-pay | Admitting: Cardiovascular Disease

## 2015-02-18 NOTE — Telephone Encounter (Signed)
Rx(s) sent to pharmacy electronically. OV 02/28/15 

## 2015-02-28 ENCOUNTER — Encounter: Payer: Self-pay | Admitting: Cardiovascular Disease

## 2015-02-28 ENCOUNTER — Ambulatory Visit (INDEPENDENT_AMBULATORY_CARE_PROVIDER_SITE_OTHER): Payer: Medicare Other | Admitting: Cardiovascular Disease

## 2015-02-28 VITALS — BP 136/100 | HR 65 | Ht 66.0 in | Wt 168.8 lb

## 2015-02-28 DIAGNOSIS — E785 Hyperlipidemia, unspecified: Secondary | ICD-10-CM

## 2015-02-28 DIAGNOSIS — I429 Cardiomyopathy, unspecified: Secondary | ICD-10-CM

## 2015-02-28 DIAGNOSIS — I420 Dilated cardiomyopathy: Secondary | ICD-10-CM | POA: Diagnosis not present

## 2015-02-28 DIAGNOSIS — N189 Chronic kidney disease, unspecified: Secondary | ICD-10-CM

## 2015-02-28 DIAGNOSIS — I428 Other cardiomyopathies: Secondary | ICD-10-CM

## 2015-02-28 DIAGNOSIS — I517 Cardiomegaly: Secondary | ICD-10-CM

## 2015-02-28 DIAGNOSIS — N183 Chronic kidney disease, stage 3 unspecified: Secondary | ICD-10-CM

## 2015-02-28 DIAGNOSIS — N2889 Other specified disorders of kidney and ureter: Secondary | ICD-10-CM

## 2015-02-28 DIAGNOSIS — Z79899 Other long term (current) drug therapy: Secondary | ICD-10-CM | POA: Diagnosis not present

## 2015-02-28 DIAGNOSIS — I10 Essential (primary) hypertension: Secondary | ICD-10-CM

## 2015-02-28 MED ORDER — PANTOPRAZOLE SODIUM 40 MG PO TBEC: 40.0000 mg | DELAYED_RELEASE_TABLET | Freq: Every day | ORAL | Status: DC

## 2015-02-28 NOTE — Patient Instructions (Addendum)
Your physician recommends that you return for lab work FASTING.  Your physician has requested that you have an echocardiogram. Echocardiography is a painless test that uses sound waves to create images of your heart. It provides your doctor with information about the size and shape of your heart and how well your heart's chambers and valves are working. This procedure takes approximately one hour. There are no restrictions for this procedure. This will be done in April 2017.  Your physician wants you to follow-up in: 6 months or sooner if needed. You will receive a reminder letter in the mail two months in advance. If you don't receive a letter, please call our office to schedule the follow-up appointment.  Your physician has recommended you make the following change in your medication: start new prescription for pantoprazole 40 mg.   If you need a refill on your cardiac medications before your next appointment, please call your pharmacy.

## 2015-03-01 ENCOUNTER — Encounter: Payer: Self-pay | Admitting: Cardiovascular Disease

## 2015-03-01 NOTE — Progress Notes (Signed)
Patient ID: Thomas Bullock, male   DOB: 14-Aug-1950, 64 y.o.   MRN: [id number]      HPI: Thomas Bullock is a 64 y.o. male with a history of a nonischemic cardiomyopathy who presents to the office for an 5-monthcardiology evaluation.  [name] long-standing history of hypertension. In May 2013 after being off his medications for some time [facility]. He was found to have a nonischemic cardiomyopathy with an ejection fraction of 20-25% and minimal CAD with 20% narrowing in the LAD noted at catheterization.With aggressive medical therapy and gradual titration of his medications  LV function has improved. An echo Doppler study in September 2013 showed an ejection fraction of 45% and an echo Doppler study on 01/26/2014 revealed complete normalization of LV function with an ejection fraction of 60-65%.  There was grade 1 diastolic dysfunction.  He had a determine LV filling pressure.  PA pressure was normal at 11 mm.  His  ascending aorta was moderately dilated up to 4.7 cm.   Since I last saw him, he denies episodes of chest pain.  He has continued to do well the he denies any significant shortness of breath but still notes  mild shortness of breath with activity. He denies   palpitations, presyncope  syncope or arrhythmia.  He admits to occasional episodes of indigestion and has been taking over-the-counter Tums for this with plus minus benefit.  He denies any edema.  He denies PND, orthopnea.  He has been on a medical regimen consisting of low-dose Lanoxin at 0.0625 mg daily, Bidil regimen although he has been taking the medications individually with 20 mg bid of Isordil and 37.5 mg 3 times a day of hydralazine, spironolactone 12.5 mg twice a day, furosemide 20 mg daily, carvedilol 12.5 mg twice a day, and Lotensin 40 mg daily.    Past Medical History  Diagnosis Date  . Hypertension   . Acute pulmonary edema ([facility] 10/04/2011  .  LVH (left ventricular hypertrophy) 10/04/2011  . Systolic CHF ([facility] 73/08/7423   Hospitalized for flash pulmonary edema in June 2012 due to malignant HTN.   EF was 20-25% with severe global hypokinesis. left heart cath which revealed an LAD with 20% smooth narrowing but otherwise normal coronaries. On ace inhibitor, spironalactone, carvedilol. Diuresed in hospital.  Life Vest was ordered and fitted by [employer] to wear until September.     .Marland KitchenGERD (gastroesophageal reflux disease) 10/04/2011  . Coronary artery disease     Past Surgical History  Procedure Laterality Date  . Dental surgery    . Cholecystectomy, laparoscopic  2006  . Cholecystectomy    . Left heart catheterization with coronary angiogram N/A 10/05/2011    Procedure: LEFT HEART CATHETERIZATION WITH CORONARY ANGIOGRAM;  Surgeon: TTroy Sine MD;  [facility];  Service: Cardiovascular;  Laterality: N/A;  . Right heart catheterization  10/05/2011    Procedure: RIGHT HEART CATH;  Surgeon: TTroy Sine MD;  [facility];  Service: Cardiovascular;;    No Known Allergies  Current Outpatient Prescriptions  Medication Sig Dispense Refill  . benazepril (LOTENSIN) 40 MG tablet TAKE ONE TABLET BY MOUTH ONCE DAILY 30 tablet 1  . carvedilol (COREG) 12.5 MG tablet TAKE ONE TABLET BY MOUTH TWICE DAILY WITH MEALS 60 tablet 6  . digoxin (LANOXIN) 0.125 MG tablet TAKE ONE-HALF TABLET BY MOUTH ONCE DAILY. 15 tablet 1  . furosemide (LASIX) 40  MG tablet Take 0.5 tablets (20 mg total) by mouth daily. 30 tablet 6  . hydrALAZINE (APRESOLINE) 25 MG tablet TAKE ONE & ONE-HALF TABLETS BY MOUTH THREE TIMES DAILY 135 tablet 6  . isosorbide dinitrate (ISORDIL) 20 MG tablet TAKE ONE TABLET BY MOUTH THREE TIMES DAILY. 90 tablet 9  . spironolactone (ALDACTONE) 25 MG tablet TAKE ONE-HALF TABLET BY MOUTH TWICE DAILY 30 tablet 9  . pantoprazole (PROTONIX) 40 MG tablet Take 1 tablet (40 mg total) by mouth daily. 30 tablet 11   No current  facility-administered medications for this visit.    Socially he is single. He is retired. He works in [employer]. Completed 12th grade education. There is no tobacco history. He does drink occasional alcohol.  ROS General: Negative; No fevers, chills, or night sweats;  HEENT: Negative; No changes in vision or hearing, sinus congestion, difficulty swallowing Pulmonary: Negative; No cough, wheezing, shortness of breath, hemoptysis Cardiovascular: See history of present illness; No chest pain, presyncope, syncope, palpitations GI: Negative; No nausea, vomiting, diarrhea, or abdominal pain GU: Negative; No dysuria, hematuria, or difficulty voiding Musculoskeletal: Negative; no myalgias, joint pain, or weakness Hematologic/Oncology: Negative; no easy bruising, bleeding Endocrine: Negative; no heat/cold intolerance; no diabetes Neuro: Negative; no changes in balance, headaches Skin: Negative; No rashes or skin lesions Psychiatric: Negative; No behavioral problems, depression Sleep: Negative; No snoring, daytime sleepiness, hypersomnolence, bruxism, restless legs, hypnogognic hallucinations, no cataplexy Other comprehensive 14 point system review is negative.   PE BP 136/100 mmHg  Pulse 65  Ht '5\' 6"'  (1.676 m)  Wt 168 lb 12.8 oz (76.567 kg)  BMI 27.26 kg/m2   Repeat blood pressure by me 130/86  Wt Readings from Last 3 Encounters:  02/28/15 168 lb 12.8 oz (76.567 kg)  09/07/14 172 lb 8 oz (78.245 kg)  02/02/14 170 lb 4.8 oz (77.248 kg)   General: Alert, oriented, no distress.  Skin: normal turgor, no rashes HEENT: Normocephalic, atraumatic. Pupils round and reactive; sclera anicteric;no lid lag.  Nose without nasal septal hypertrophy Mouth/Parynx benign; Mallinpatti scale 3 Neck: No JVD, no carotid briuts; normal carotid upstrokes Lungs: clear to ausculatation and percussion; no wheezing or rales Chest: No tenderness to palpation Heart: RRR, s1 s2 normal no S3 gallop.  1/6 systolic murmur. No S3 gallop or S4 gallop; no diastolic murmur rubs thrills or heaves. Abdomen: soft, nontender; no hepatosplenomehaly, BS+; abdominal aorta nontender and not dilated by palpation. Back: No CVA tenderness Pulses 2+ Extremities: no clubbing cyanosis or edema, Homan's sign negative  Neurologic: grossly nonfocal; cranial nerves grossly normal. Psychological: Normal affect and mood  ECG (independently read by me): normal sinus rhythm at 65 bpm..  Probable left posterior hemiblock.  Probably progression V1 through V4.  May 2016 ECG (independently read by me): Normal sinus rhythm at 75 bpm.  First-degree AV block with a PR interval 206 ms.  T-wave inversion in leads 3 and mildly in aVF.  September 2015 ECG (independently read by me): Normal sinus rhythm with one isolated PVC.  QTc interval 397 ms.  PR interval 190 ms.  Prior January 2015 ECG (independently read by me): Normal sinus rhythm at 60 beats per minute. Normal intervals.  Previous ECG from 12/04/2012 : Sinus bradycardia 53 beats per minute period. PR interval 196; QTc interval 377 ms  LABS: BMP Latest Ref Rng 09/28/2014 02/10/2014 08/07/2013  Glucose 70 - 99 mg/dL 90 98 100(H)  BUN 6 - 23 mg/dL '12 15 16  ' Creatinine 0.50 - 1.35 mg/dL 1.21 1.47(H)  1.43(H)  Sodium 135 - 145 mEq/L 137 139 140  Potassium 3.5 - 5.3 mEq/L 4.3 4.3 4.1  Chloride 96 - 112 mEq/L 105 105 103  CO2 19 - 32 mEq/L '23 24 23  ' Calcium 8.4 - 10.5 mg/dL 9.4 9.4 9.4   Hepatic Function Latest Ref Rng 09/28/2014 02/10/2014 06/02/2013  Total Protein 6.0 - 8.3 g/dL 7.2 7.3 7.0  Albumin 3.5 - 5.2 g/dL 3.8 3.9 3.9  AST 0 - 37 U/L 41(H) 43(H) 40(H)  ALT 0 - 53 U/L 32 40 32  Alk Phosphatase 39 - 117 U/L 79 83 72  Total Bilirubin 0.2 - 1.2 mg/dL 1.0 0.9 0.7  Bilirubin, Direct 0.0 - 0.3 mg/dL - - -   CBC Latest Ref Rng 02/10/2014 08/07/2013 06/02/2013  WBC 4.0 - 10.5 K/uL 5.4 6.3 4.6  Hemoglobin 13.0 - 17.0 g/dL 14.3 13.8 14.1  Hematocrit 39.0 - 52.0 % 40.2  39.4 40.8  Platelets 150 - 400 K/uL 202 185 197   Lab Results  Component Value Date   TSH 1.950 02/10/2014   Lipid Panel     Component Value Date/Time   CHOL 149 02/10/2014 1125   TRIG 138 02/10/2014 1125   HDL 35* 02/10/2014 1125   CHOLHDL 4.3 02/10/2014 1125   VLDL 28 02/10/2014 1125   LDLCALC 86 02/10/2014 1125   BNP    Component Value Date/Time   PROBNP 1662.0* 10/06/2011 0625     RADIOLOGY: No results found.    ASSESSMENT AND PLAN: Mr. Freid is a 64 year old African-American male who has a long-standing history of hypertension and a history of a nonischemic cardiomyopathy who presented in pulmonary edema after he had stopped his blood pressure medications for several months in May 2013. Since that time on medical therapy his ejection fraction has improved from 20% to  60-65% as noted on his echo of 01/26/2014.  He has moderate dilatation of his ascending aorta.  Pulmonary pressures are normal.  He is unaware of any arrhythmia.  Presently, he is well compensated without overt CHF symptomatology.  He does note frequent episodes of indigestion which may not be controlled with over-the-counter Tums.  I suggested a trial of Protonix 40 mg to his medical regimen to see if this can improve his symptomatology.  I am recommending fasting laboratory be obtained to make certain he is tolerating his medications and medication adjustment is not needed.  In 6 months, I am recommending that he undergo a year follow-up echo Doppler study to reassess his LV function.  I will see him in the office in follow-up and further recommendations will be made at that time.  Time spent: 25 minutes  Troy Sine, MD, [facility]  03/01/2015 7:46 PM

## 2015-03-02 DIAGNOSIS — I10 Essential (primary) hypertension: Secondary | ICD-10-CM | POA: Diagnosis not present

## 2015-03-02 DIAGNOSIS — E785 Hyperlipidemia, unspecified: Secondary | ICD-10-CM | POA: Diagnosis not present

## 2015-03-02 DIAGNOSIS — Z79899 Other long term (current) drug therapy: Secondary | ICD-10-CM | POA: Diagnosis not present

## 2015-03-02 LAB — COMPREHENSIVE METABOLIC PANEL
ALT: 31 U/L (ref 9–46)
AST: 37 U/L — AB (ref 10–35)
Albumin: 3.7 g/dL (ref 3.6–5.1)
Alkaline Phosphatase: 87 U/L (ref 40–115)
BILIRUBIN TOTAL: 0.9 mg/dL (ref 0.2–1.2)
BUN: 17 mg/dL (ref 7–25)
CALCIUM: 9.4 mg/dL (ref 8.6–10.3)
CO2: 24 mmol/L (ref 20–31)
CREATININE: 1.23 mg/dL (ref 0.70–1.25)
Chloride: 103 mmol/L (ref 98–110)
GLUCOSE: 94 mg/dL (ref 65–99)
Potassium: 4.2 mmol/L (ref 3.5–5.3)
SODIUM: 137 mmol/L (ref 135–146)
Total Protein: 7.1 g/dL (ref 6.1–8.1)

## 2015-03-02 LAB — CBC
HCT: 42.5 % (ref 39.0–52.0)
Hemoglobin: 14.4 g/dL (ref 13.0–17.0)
MCH: 25.9 pg — ABNORMAL LOW (ref 26.0–34.0)
MCHC: 33.9 g/dL (ref 30.0–36.0)
MCV: 76.4 fL — ABNORMAL LOW (ref 78.0–100.0)
MPV: 10.6 fL (ref 8.6–12.4)
PLATELETS: 222 10*3/uL (ref 150–400)
RBC: 5.56 MIL/uL (ref 4.22–5.81)
RDW: 14 % (ref 11.5–15.5)
WBC: 5.4 10*3/uL (ref 4.0–10.5)

## 2015-03-02 LAB — LIPID PANEL
CHOL/HDL RATIO: 4 ratio (ref <=5.0)
Cholesterol: 153 mg/dL (ref 125–200)
HDL: 38 mg/dL — ABNORMAL LOW (ref >=40)
LDL CALC: 88 mg/dL (ref <130)
Triglycerides: 135 mg/dL (ref <150)
VLDL: 27 mg/dL (ref <30)

## 2015-03-03 LAB — TSH: TSH: 3.636 u[IU]/mL (ref 0.350–4.500)

## 2015-03-03 LAB — DIGOXIN LEVEL: Digoxin Level: <0.5 ug/L — ABNORMAL LOW (ref 0.8–2.0)

## 2015-04-04 ENCOUNTER — Encounter: Payer: Self-pay | Admitting: *Deleted

## 2015-04-20 ENCOUNTER — Other Ambulatory Visit: Payer: Self-pay | Admitting: Cardiovascular Disease

## 2015-04-20 NOTE — Telephone Encounter (Signed)
Rx has been sent to the pharmacy electronically. ° °

## 2015-05-23 ENCOUNTER — Other Ambulatory Visit: Payer: Self-pay | Admitting: Cardiovascular Disease

## 2015-05-23 NOTE — Telephone Encounter (Signed)
Rx(s) sent to pharmacy electronically.  

## 2015-06-27 ENCOUNTER — Other Ambulatory Visit: Payer: Self-pay | Admitting: Cardiovascular Disease

## 2015-06-27 NOTE — Telephone Encounter (Signed)
REFILL 

## 2015-07-05 ENCOUNTER — Other Ambulatory Visit: Payer: Self-pay | Admitting: Cardiovascular Disease

## 2015-08-01 ENCOUNTER — Other Ambulatory Visit: Payer: Self-pay | Admitting: Cardiovascular Disease

## 2015-09-02 ENCOUNTER — Encounter (HOSPITAL_COMMUNITY): Payer: Self-pay | Admitting: Emergency Medicine

## 2015-09-02 ENCOUNTER — Emergency Department (HOSPITAL_COMMUNITY): Payer: Medicare Other

## 2015-09-02 ENCOUNTER — Observation Stay (HOSPITAL_COMMUNITY): Payer: Medicare Other

## 2015-09-02 ENCOUNTER — Observation Stay (HOSPITAL_COMMUNITY)
Admission: EM | Admit: 2015-09-02 | Discharge: 2015-09-06 | Disposition: A | Discharge status: Home / Self Care | Payer: Medicare Other | Attending: Internal Medicine | Admitting: Internal Medicine

## 2015-09-02 DIAGNOSIS — N183 Chronic kidney disease, stage 3 unspecified: Secondary | ICD-10-CM | POA: Diagnosis present

## 2015-09-02 DIAGNOSIS — N2889 Other specified disorders of kidney and ureter: Secondary | ICD-10-CM | POA: Diagnosis present

## 2015-09-02 DIAGNOSIS — R112 Nausea with vomiting, unspecified: Secondary | ICD-10-CM | POA: Insufficient documentation

## 2015-09-02 DIAGNOSIS — I130 Hypertensive heart and chronic kidney disease with heart failure and stage 1 through stage 4 chronic kidney disease, or unspecified chronic kidney disease: Secondary | ICD-10-CM | POA: Insufficient documentation

## 2015-09-02 DIAGNOSIS — G459 Transient cerebral ischemic attack, unspecified: Principal | ICD-10-CM

## 2015-09-02 DIAGNOSIS — R42 Dizziness and giddiness: Secondary | ICD-10-CM

## 2015-09-02 DIAGNOSIS — I428 Other cardiomyopathies: Secondary | ICD-10-CM

## 2015-09-02 DIAGNOSIS — K219 Gastro-esophageal reflux disease without esophagitis: Secondary | ICD-10-CM | POA: Diagnosis not present

## 2015-09-02 DIAGNOSIS — R1013 Epigastric pain: Secondary | ICD-10-CM | POA: Diagnosis not present

## 2015-09-02 DIAGNOSIS — H532 Diplopia: Secondary | ICD-10-CM

## 2015-09-02 DIAGNOSIS — I2510 Atherosclerotic heart disease of native coronary artery without angina pectoris: Secondary | ICD-10-CM | POA: Diagnosis not present

## 2015-09-02 DIAGNOSIS — R0789 Other chest pain: Secondary | ICD-10-CM | POA: Insufficient documentation

## 2015-09-02 DIAGNOSIS — I429 Cardiomyopathy, unspecified: Secondary | ICD-10-CM | POA: Insufficient documentation

## 2015-09-02 DIAGNOSIS — N179 Acute kidney failure, unspecified: Secondary | ICD-10-CM | POA: Insufficient documentation

## 2015-09-02 DIAGNOSIS — I10 Essential (primary) hypertension: Secondary | ICD-10-CM | POA: Diagnosis present

## 2015-09-02 DIAGNOSIS — I639 Cerebral infarction, unspecified: Secondary | ICD-10-CM | POA: Diagnosis not present

## 2015-09-02 DIAGNOSIS — R262 Difficulty in walking, not elsewhere classified: Secondary | ICD-10-CM | POA: Insufficient documentation

## 2015-09-02 DIAGNOSIS — I5020 Unspecified systolic (congestive) heart failure: Secondary | ICD-10-CM | POA: Diagnosis present

## 2015-09-02 DIAGNOSIS — Z87891 Personal history of nicotine dependence: Secondary | ICD-10-CM | POA: Insufficient documentation

## 2015-09-02 DIAGNOSIS — H538 Other visual disturbances: Secondary | ICD-10-CM | POA: Insufficient documentation

## 2015-09-02 DIAGNOSIS — K920 Hematemesis: Secondary | ICD-10-CM | POA: Diagnosis not present

## 2015-09-02 DIAGNOSIS — K449 Diaphragmatic hernia without obstruction or gangrene: Secondary | ICD-10-CM | POA: Diagnosis not present

## 2015-09-02 DIAGNOSIS — I5022 Chronic systolic (congestive) heart failure: Secondary | ICD-10-CM | POA: Diagnosis not present

## 2015-09-02 DIAGNOSIS — R079 Chest pain, unspecified: Secondary | ICD-10-CM | POA: Diagnosis not present

## 2015-09-02 LAB — BASIC METABOLIC PANEL
ANION GAP: 13 (ref 5–15)
BUN: 15 mg/dL (ref 6–20)
CHLORIDE: 110 mmol/L (ref 101–111)
CO2: 19 mmol/L — AB (ref 22–32)
Calcium: 9.9 mg/dL (ref 8.9–10.3)
Creatinine, Ser: 1.54 mg/dL — ABNORMAL HIGH (ref 0.61–1.24)
GFR calc Af Amer: 53 mL/min — ABNORMAL LOW (ref >60)
GFR calc non Af Amer: 46 mL/min — ABNORMAL LOW (ref >60)
GLUCOSE: 94 mg/dL (ref 65–99)
POTASSIUM: 3.7 mmol/L (ref 3.5–5.1)
Sodium: 142 mmol/L (ref 135–145)

## 2015-09-02 LAB — CBC
HCT: 43 % (ref 39.0–52.0)
HEMOGLOBIN: 15 g/dL (ref 13.0–17.0)
MCH: 26.9 pg (ref 26.0–34.0)
MCHC: 34.9 g/dL (ref 30.0–36.0)
MCV: 77.2 fL — ABNORMAL LOW (ref 78.0–100.0)
Platelets: 241 10*3/uL (ref 150–400)
RBC: 5.57 MIL/uL (ref 4.22–5.81)
RDW: 14.5 % (ref 11.5–15.5)
WBC: 7.2 10*3/uL (ref 4.0–10.5)

## 2015-09-02 LAB — COMPREHENSIVE METABOLIC PANEL
ALBUMIN: 3.6 g/dL (ref 3.5–5.0)
ALK PHOS: 75 U/L (ref 38–126)
ALT: 34 U/L (ref 17–63)
ANION GAP: 14 (ref 5–15)
AST: 44 U/L — ABNORMAL HIGH (ref 15–41)
BUN: 17 mg/dL (ref 6–20)
CHLORIDE: 111 mmol/L (ref 101–111)
CO2: 14 mmol/L — AB (ref 22–32)
Calcium: 10.2 mg/dL (ref 8.9–10.3)
Creatinine, Ser: 1.58 mg/dL — ABNORMAL HIGH (ref 0.61–1.24)
GFR calc Af Amer: 52 mL/min — ABNORMAL LOW (ref >60)
GFR calc non Af Amer: 45 mL/min — ABNORMAL LOW (ref >60)
GLUCOSE: 117 mg/dL — AB (ref 65–99)
POTASSIUM: 3.6 mmol/L (ref 3.5–5.1)
SODIUM: 139 mmol/L (ref 135–145)
Total Bilirubin: 1 mg/dL (ref 0.3–1.2)
Total Protein: 7.2 g/dL (ref 6.5–8.1)

## 2015-09-02 LAB — URINALYSIS, ROUTINE W REFLEX MICROSCOPIC
GLUCOSE, UA: NEGATIVE mg/dL
HGB URINE DIPSTICK: NEGATIVE
Ketones, ur: 15 mg/dL — AB
NITRITE: NEGATIVE
PROTEIN: NEGATIVE mg/dL
Specific Gravity, Urine: 1.021 (ref 1.005–1.030)
pH: 7 (ref 5.0–8.0)

## 2015-09-02 LAB — I-STAT VENOUS BLOOD GAS, ED
BICARBONATE: 22.3 meq/L (ref 20.0–24.0)
O2 SAT: 88 %
TCO2: 23 mmol/L (ref 0–100)
pCO2, Ven: 30.4 mmHg — ABNORMAL LOW (ref 45.0–50.0)
pH, Ven: 7.474 — ABNORMAL HIGH (ref 7.250–7.300)
pO2, Ven: 51 mmHg — ABNORMAL HIGH (ref 31.0–45.0)

## 2015-09-02 LAB — DIGOXIN LEVEL: Digoxin Level: <0.2 ng/mL — ABNORMAL LOW (ref 0.8–2.0)

## 2015-09-02 LAB — D-DIMER, QUANTITATIVE (NOT AT ARMC)

## 2015-09-02 LAB — LIPASE, BLOOD: LIPASE: 61 U/L — AB (ref 11–51)

## 2015-09-02 LAB — I-STAT TROPONIN, ED: Troponin i, poc: 0.01 ng/mL (ref 0.00–0.08)

## 2015-09-02 LAB — URINE MICROSCOPIC-ADD ON

## 2015-09-02 LAB — I-STAT CG4 LACTIC ACID, ED: Lactic Acid, Venous: 2 mmol/L (ref 0.5–2.0)

## 2015-09-02 MED ORDER — ONDANSETRON HCL 4 MG/2ML IJ SOLN
4.0000 mg | Freq: Four times a day (QID) | INTRAMUSCULAR | Status: DC | PRN
  Administered 2015-09-02 – 2015-09-03 (×2): 4 mg via INTRAVENOUS
  Filled 2015-09-02 (×2): qty 2

## 2015-09-02 MED ORDER — SODIUM CHLORIDE 0.9 % IV BOLUS (SEPSIS)
1000.0000 mL | Freq: Once | INTRAVENOUS | Status: AC
  Administered 2015-09-02: 1000 mL via INTRAVENOUS

## 2015-09-02 MED ORDER — ONDANSETRON HCL 4 MG PO TABS: 4.0000 mg | ORAL_TABLET | Freq: Four times a day (QID) | ORAL | Status: DC | PRN

## 2015-09-02 MED ORDER — ONDANSETRON HCL 4 MG/2ML IJ SOLN
4.0000 mg | Freq: Once | INTRAMUSCULAR | Status: AC
  Administered 2015-09-02: 4 mg via INTRAVENOUS
  Filled 2015-09-02: qty 2

## 2015-09-02 NOTE — ED Provider Notes (Signed)
CSN: [id number]     Arrival date & time 09/02/15  1147 History   First MD Initiated Contact with Patient 09/02/15 1312     Chief Complaint  Patient presents with  . Emesis  . Dizziness     (Consider location/radiation/quality/duration/timing/severity/associated sxs/prior Treatment) HPI  65 year old male presents with a chief complaint of dizziness. The history is somewhat limited as the patient is very vague about his symptoms. He states the dizziness has been on and off for the past 2 days. He states it is not room spinning and it does not feel like he is going to pass out but feels like he is off balance. He has had associated blurry vision as well. It is not all the time, sometimes with standing, but other times he cannot tell me when it happens. There is no associated headache. Denies weakness. Patient states he ate an egg salad sandwich which this morning and when he got here he vomited it back up. He no longer feels nauseated. He's not sure if this is related to the dizziness. He denies headache, or neck pain. He denies chest pain or short of breath but says he has left side pain. This has been ongoing for the past 4 days. It also comes and goes and is not present right now. No urinary symptoms. No abdominal pain. Denies pleuritic symptoms.  Past Medical History  Diagnosis Date  . Hypertension   . Acute pulmonary edema (H[facility]) 10/04/2011  . LVH (left ventricular hypertrophy) 10/04/2011  . Systolic CHF ([facility]) 11/07/2011    Hospitalized for flash pulmonary edema in June 2012 due to malignant HTN.   EF was 20-25% with severe global hypokinesis. left heart cath which revealed an LAD with 20% smooth narrowing but otherwise normal coronaries. On ace inhibitor, spironalactone, carvedilol. Diuresed in hospital.  Life Vest was ordered and fitted by [facility] to wear until September.     Marland Kitchen GERD (gastroesophageal reflux disease) 10/04/2011  . Coronary artery disease    Past Surgical History  Procedure  Laterality Date  . Dental surgery    . Cholecystectomy, laparoscopic  2006  . Cholecystectomy    . Left heart catheterization with coronary angiogram N/A 10/05/2011    Procedure: LEFT HEART CATHETERIZATION WITH CORONARY ANGIOGRAM;  Surgeon: Lennette Bihari, MD;  Location[facility] LAB;  Service: Cardiovascular;  Laterality: N/A;  . Right heart catheterization  10/05/2011    Procedure: RIGHT HEART CATH;  Surgeon: Lennette Bihari, MD;  [facility];  Service: Cardiovascular;;   Family History  Problem Relation Age of Onset  . Kidney disease Mother     30  . Stroke Mother   . Diabetes type II Mother     Patient uncertain but history of multiple amuputations.   . Heart attack Father     2007-died of Heart attack @ age 63  . Heart disease Sister   . Heart disease Brother    Social History  Substance Use Topics  . Smoking status: Former Smoker    Types: Cigarettes    Quit date: 05/07/1973  . Smokeless tobacco: Never Used     Comment: on 11/07/11 patient states never smoker.   . Alcohol Use: No     Comment: no longer drinks since being sick.     Review of Systems  Eyes: Positive for visual disturbance. Negative for pain.  Respiratory: Negative for shortness of breath.   Cardiovascular: Negative for chest pain.  Gastrointestinal: Positive for vomiting. Negative for abdominal  pain.  Genitourinary: Positive for flank pain. Negative for dysuria and hematuria.  Neurological: Positive for dizziness. Negative for weakness, numbness and headaches.  All other systems reviewed and are negative.     Allergies  Review of patient's allergies indicates no known allergies.  Home Medications   Prior to Admission medications   Medication Sig Start Date End Date Taking? Authorizing Provider  benazepril (LOTENSIN) 40 MG tablet TAKE ONE TABLET BY MOUTH ONCE DAILY 04/20/15   Lennette Bihari, MD  carvedilol (COREG) 12.5 MG tablet TAKE ONE TABLET BY MOUTH TWICE DAILY WITH A MEAL 06/27/15    Lennette Bihari, MD  digoxin (LANOXIN) 0.125 MG tablet TAKE ONE-HALF TABLET BY MOUTH ONCE DAILY 04/20/15   Lennette Bihari, MD  furosemide (LASIX) 40 MG tablet Take 0.5 tablets (20 mg total) by mouth daily. 09/07/14   Lennette Bihari, MD  furosemide (LASIX) 40 MG tablet TAKE ONE TABLET BY MOUTH ONCE DAILY 07/05/15   Lennette Bihari, MD  hydrALAZINE (APRESOLINE) 25 MG tablet TAKE ONE AND ONE-HALF TABLETS BY MOUTH THREE TIMES DAILY 08/01/15   Lennette Bihari, MD  isosorbide dinitrate (ISORDIL) 20 MG tablet TAKE ONE TABLET BY MOUTH THREE TIMES DAILY 05/23/15   Lennette Bihari, MD  pantoprazole (PROTONIX) 40 MG tablet Take 1 tablet (40 mg total) by mouth daily. 02/28/15   Lennette Bihari, MD  spironolactone (ALDACTONE) 25 MG tablet TAKE ONE-HALF TABLET BY MOUTH TWICE DAILY 10/21/14   Lennette Bihari, MD   BP 189/107 mmHg  Pulse 54  Temp(Src) 98.4 F (36.9 C)  Resp 17  SpO2 100% Physical Exam  Constitutional: He is oriented to person, place, and time. He appears well-developed and well-nourished.  HENT:  Head: Normocephalic and atraumatic.  Right Ear: External ear normal.  Left Ear: External ear normal.  Nose: Nose normal.  Eyes: EOM are normal. Pupils are equal, round, and reactive to light. Right eye exhibits no discharge. Left eye exhibits no discharge.  Neck: Neck supple.  Cardiovascular: Normal rate, regular rhythm, normal heart sounds and intact distal pulses.   Pulmonary/Chest: Effort normal and breath sounds normal. He exhibits no tenderness.  Abdominal: Soft. He exhibits no distension. There is no tenderness.  Musculoskeletal: He exhibits no edema.  No flank or CVA tenderness  Neurological: He is alert and oriented to person, place, and time.  CN 2-12 grossly intact. 5/5 strength in all 4 extremities. Grossly normal sensation. Normal finger to nose. Normal gait  Skin: Skin is warm and dry.  Nursing note and vitals reviewed.   ED Course  Procedures (including critical care time) Labs  Review Labs Reviewed  CBC - Abnormal; Notable for the following:    MCV 77.2 (*)    All other components within normal limits  COMPREHENSIVE METABOLIC PANEL - Abnormal; Notable for the following:    CO2 14 (*)    Glucose, Bld 117 (*)    Creatinine, Ser 1.58 (*)    AST 44 (*)    GFR calc non Af Amer 45 (*)    GFR calc Af Amer 52 (*)    All other components within normal limits  LIPASE, BLOOD - Abnormal; Notable for the following:    Lipase 61 (*)    All other components within normal limits  URINALYSIS, ROUTINE W REFLEX MICROSCOPIC (NOT AT [facility]) - Abnormal; Notable for the following:    Color, Urine AMBER (*)    Bilirubin Urine SMALL (*)    Ketones, ur 15 (*)  Leukocytes, UA TRACE (*)    All other components within normal limits  URINE MICROSCOPIC-ADD ON - Abnormal; Notable for the following:    Squamous Epithelial / LPF 0-5 (*)    Bacteria, UA RARE (*)    All other components within normal limits  BASIC METABOLIC PANEL - Abnormal; Notable for the following:    CO2 19 (*)    Creatinine, Ser 1.54 (*)    GFR calc non Af Amer 46 (*)    GFR calc Af Amer 53 (*)    All other components within normal limits  I-STAT VENOUS BLOOD GAS, ED - Abnormal; Notable for the following:    pH, Ven 7.474 (*)    pCO2, Ven 30.4 (*)    pO2, Ven 51.0 (*)    All other components within normal limits  D-DIMER, QUANTITATIVE (NOT AT [facility])  I-STAT TROPOININ, ED  I-STAT CG4 LACTIC ACID, ED    Imaging Review Dg Chest 2 View  09/02/2015  CLINICAL DATA:  Dizziness and left-sided chest pain for 2 days. Elevated blood pressure. Episode of vomiting. EXAM: CHEST  2 VIEW COMPARISON:  08/07/2013 FINDINGS: The cardiomediastinal contours are normal. The lungs are clear. Pulmonary vasculature is normal. No consolidation, pleural effusion, or pneumothorax. No acute osseous abnormalities are seen. Surgical clips in the right upper quadrant of the abdomen. IMPRESSION: No acute pulmonary process. Electronically Signed    By: Rubye Oaks M.D.   On: 09/02/2015 12:32   Ct Head Wo Contrast  09/02/2015  CLINICAL DATA:  Dizzy since waking this morning; history of hypertension EXAM: CT HEAD WITHOUT CONTRAST TECHNIQUE: Contiguous axial images were obtained from the base of the skull through the vertex without intravenous contrast. COMPARISON:  08/29/2007 FINDINGS: Moderate diffuse atrophy as seen on the prior study. Mild low attenuation in the deep white matter consistent with chronic small vessel change. No mass or infarct. No hemorrhage or extra-axial fluid. No hydrocephalus. Small mucous retention cysts or polyps in both maxillary sinuses. Minimal mucosal thickening ethmoid air cells bilaterally. Small polyps or retention cysts in the sphenoid sinuses. Frontal sinuses are clear. Posterior right mastoid air cells are opacified. IMPRESSION: Chronic inflammatory change in the sinuses. Age-related involutional change involving the brain with no acute intracranial abnormality. Electronically Signed   By: Esperanza Heir M.D.   On: 09/02/2015 14:37   Ct Renal Stone Study  09/02/2015  CLINICAL DATA:  Diffuse abdominal pain. Dizziness. Vomiting. Left flank pain. EXAM: CT ABDOMEN AND PELVIS WITHOUT CONTRAST TECHNIQUE: Multidetector CT imaging of the abdomen and pelvis was performed following the standard protocol without IV contrast. COMPARISON:  None. FINDINGS: Lower chest: There is a small hiatal hernia. Lung bases are clear. Heart size is normal. Hepatobiliary: 2 cm cyst in the right lobe of the liver adjacent to the gallbladder fossa. Gallbladder has been removed. No biliary tree dilatation. Pancreas: Normal. Spleen: Normal. Adrenals/Urinary Tract: Adrenal glands are normal. No renal calculi. 15 mm low-density lesion on the tip of the lower pole of the right kidney, most likely a cyst. Cortical irregularity of the lower pole possibly from previous infarct. There is a 9 mm low-density area in the right upper pole and a 10 mm  low-density in the left upper pole. These are not well enough seen characterization. New the ureters and bladder appear normal. Stomach/Bowel: Small hiatal hernia. Otherwise normal including the terminal ileum and appendix. Vascular/Lymphatic: Extensive atherosclerotic disease of the aorta and iliac arteries and common femoral arteries. No adenopathy. Reproductive: Normal. Other: No  free air or free fluid. Musculoskeletal: No acute abnormality. Chronic fusion of the sacroiliac joints. IMPRESSION: Small hiatal hernia. Otherwise benign appearing abdomen. Small low-density areas in the upper poles of both kidneys are nonspecific the lower pole cyst on the right was visible are abdominal ultrasound dated 10/15/2004. Renal ultrasound on an elective basis could be used for further characterization. Electronically Signed   By: Francene Boyers M.D.   On: 09/02/2015 14:41   I have personally reviewed and evaluated these images and lab results as part of my medical decision-making.   EKG Interpretation   Date/Time:  Friday September 02 2015 12:02:05 EDT Ventricular Rate:  61 PR Interval:  202 QRS Duration: 88 QT Interval:  426 QTC Calculation: 428 R Axis:   112 Text Interpretation:  Sinus rhythm with occasional Premature ventricular  complexes Right axis deviation Septal infarct , age undetermined Abnormal  ECG besides PVCs, no significant change since 2015 Confirmed by Shalonda Sachse   MD, Nayib Remer ([id number]) on 09/02/2015 12:54:18 PM      MDM   Final diagnoses:  None    Patient presents with vague dizziness. His neuro exam is unremarkable including gait. He is hypertensive however. Initial workup was unremarkable except for a low bicarbonate. He has vomited once but unclear otherwise why this is low. His vbg shows no acidosis, even shows some respiratory alkalosis. Flank pain is vague and not present at this time. I doubt PE and ddimer is negative in low risk patient. CT stone study shows no obvious flank pathology  such as AAA or renal stone. Dizziness comes and goes but he is able to ambulate. Will get MRI to r/o posterior circulation CVA. Unclear why his bicarb is low but anion gap. Is improving after fluids. Plan for MRI, re-assessment and likely discharge if negative. Care to Dr. Jodi Mourning with MRI pending.    Pricilla Loveless, MD 09/02/15 [phone]

## 2015-09-02 NOTE — ED Notes (Signed)
Pt now off the floor with tech to 5W

## 2015-09-02 NOTE — ED Notes (Signed)
dizzines and left sided pain x 2 days bp has been high pt vomited at triage

## 2015-09-02 NOTE — H&P (Signed)
HISTORY AND PHYSICAL       PATIENT DETAILS Name: Thomas Bullock Age: 65 y.o. Sex: male Date of Birth: [birth year] Admit Date: 09/02/2015 PCP:No PCP Per Patient Referring MD/NP/PA: Dr Jodi Mourning Outpatient Specialists: Dr. Daphene Jaeger Patient coming from: Home   CHIEF COMPLAINT:  Dizziness, vomiting since yesterday evening  HPI: Thomas Bullock is a 65 y.o. male with medical history significant of nonischemic cardiomyopathy (previously with low EF-but last echo in 2015 with normalized EF), hypertension who presented to the ED with vertigo, vomiting since yesterday evening. Patient is a poor historian-but apparently since yesterday evening he started developing dizziness and occasional lightheadedness. He claims his gait was unsteady and he had to hold onto objects to get around. He subsequently started having vomiting-nonbloody/nonbilious today. He claims he vomited around 3 times. Because of these symptoms he presented to the ED for further evaluation and treatment.  He denies any headache, chest pain or shortness of breath. Denies any abdominal pain. Denies any diarrhea. Denies any fever.   ED Course: In the ED, patient was given supportive care with Zofran and IV fluids. CT of the head was negative for acute abnormalities. CT of the abdomen renal stone study did not show any acute abnormalities. Because of persistent vomiting, I was asked to admit this patient for further evaluation and treatment.  Lives at: Home Mobility: Independent Chronic Indwelling Foley:no   REVIEW OF SYSTEMS:  Constitutional:   No  weight loss, night sweats,  Fevers, chills, fatigue.  HEENT:    No headaches, Dysphagia,Tooth/dental problems,Sore throat,  No sneezing, itching, ear ache, nasal congestion, post nasal drip  Cardio-vascular: No chest pain,Orthopnea, PND,lower extremity edema, anasarca, palpitations  GI:  No heartburn, indigestion, abdominal pain,  diarrhea, melena or  hematochezia  Resp: No shortness of breath, cough, hemoptysis,plueritic chest pain.   Skin:  No rash or lesions.  GU:  No dysuria, change in color of urine, no urgency or frequency.  No flank pain.  Musculoskeletal: No joint pain or swelling.  No decreased range of motion.  No back pain.  Endocrine: No heat intolerance, no cold intolerance, no polyuria, no polydipsia  Psych: No change in mood or affect. No depression or anxiety.  No memory loss.   ALLERGIES:  No Known Allergies  PAST MEDICAL HISTORY: Past Medical History  Diagnosis Date  . Hypertension   . Acute pulmonary edema (HCC) 10/04/2011  . LVH (left ventricular hypertrophy) 10/04/2011  . Systolic CHF (HCC) 11/07/2011    Hospitalized for flash pulmonary edema in June 2012 due to malignant HTN.   EF was 20-25% with severe global hypokinesis. left heart cath which revealed an LAD with 20% smooth narrowing but otherwise normal coronaries. On ace inhibitor, spironalactone, carvedilol. Diuresed in hospital.  Life Vest was ordered and fitted by [employer] to wear until September.     Marland Kitchen GERD (gastroesophageal reflux disease) 10/04/2011  . Coronary artery disease     PAST SURGICAL HISTORY: Past Surgical History  Procedure Laterality Date  . Dental surgery    . Cholecystectomy, laparoscopic  2006  . Cholecystectomy    . Left heart catheterization with coronary angiogram N/A 10/05/2011    Procedure: LEFT HEART CATHETERIZATION WITH CORONARY ANGIOGRAM;  Surgeon: Lennette Bihari, MD;  Location: [facility] CATH LAB;  Service: Cardiovascular;  Laterality: N/A;  . Right heart catheterization  10/05/2011    Procedure: RIGHT HEART CATH;  Surgeon: Lennette Bihari, MD;  Location: [facility] CATH LAB;  Service:  Cardiovascular;;    MEDICATIONS AT HOME: Prior to Admission medications   Medication Sig Start Date End Date Taking? Authorizing Provider  benazepril (LOTENSIN) 40 MG tablet TAKE ONE TABLET BY MOUTH ONCE DAILY 04/20/15  Yes Lennette Bihari, MD    carvedilol (COREG) 12.5 MG tablet TAKE ONE TABLET BY MOUTH TWICE DAILY WITH A MEAL 06/27/15  Yes Lennette Bihari, MD  digoxin (LANOXIN) 0.125 MG tablet TAKE ONE-HALF TABLET BY MOUTH ONCE DAILY 04/20/15  Yes Lennette Bihari, MD  furosemide (LASIX) 40 MG tablet Take 0.5 tablets (20 mg total) by mouth daily. 09/07/14  Yes Lennette Bihari, MD  furosemide (LASIX) 40 MG tablet TAKE ONE TABLET BY MOUTH ONCE DAILY 07/05/15  Yes Lennette Bihari, MD  hydrALAZINE (APRESOLINE) 25 MG tablet TAKE ONE AND ONE-HALF TABLETS BY MOUTH THREE TIMES DAILY 08/01/15  Yes Lennette Bihari, MD  isosorbide dinitrate (ISORDIL) 20 MG tablet TAKE ONE TABLET BY MOUTH THREE TIMES DAILY 05/23/15  Yes Lennette Bihari, MD  spironolactone (ALDACTONE) 25 MG tablet TAKE ONE-HALF TABLET BY MOUTH TWICE DAILY 10/21/14  Yes Lennette Bihari, MD  pantoprazole (PROTONIX) 40 MG tablet Take 1 tablet (40 mg total) by mouth daily. Patient not taking: Reported on 09/02/2015 02/28/15   Lennette Bihari, MD    FAMILY HISTORY: Family History  Problem Relation Age of Onset  . Kidney disease Mother     19  . Stroke Mother   . Diabetes type II Mother     Patient uncertain but history of multiple amuputations.   . Heart attack Father     2007-died of Heart attack @ age 53  . Heart disease Sister   . Heart disease Brother      SOCIAL HISTORY:  reports that he quit smoking about 42 years ago. His smoking use included Cigarettes. He has never used smokeless tobacco. He reports that he does not drink alcohol or use illicit drugs.  PHYSICAL EXAM: Blood pressure 167/108, pulse 90, temperature 98.4 F (36.9 C), resp. rate 20, SpO2 99 %.  General appearance :Awake, alert, not in any distress. Not toxic Looking.Appears to have a chronic speech impediment-but no dysarthria. HEENT: Atraumatic and Normocephalic, pupils equally reactive to light and accomodation Neck: supple, no JVD. No cervical lymphadenopathy.  Chest:Good air entry bilaterally, no added sounds   CVS: S1 S2 regular, no murmurs.  Abdomen: Bowel sounds present, Non tender and not distended with no gaurding, rigidity or rebound. Extremities: B/L Lower Ext shows no edema, both legs are warm to touch Neurology:  Non focal Psychiatric: Normal judgment and insight. Alert and oriented x 3. Normal mood. Skin:No Rash Wounds:N/A  LABS ON ADMISSION:  I have personally reviewed following labs and imaging studies  CBC:  Recent Labs Lab 09/02/15 1206  WBC 7.2  HGB 15.0  HCT 43.0  MCV 77.2*  PLT 241    Basic Metabolic Panel:  Recent Labs Lab 09/02/15 1206 09/02/15 1515  NA 139 142  K 3.6 3.7  CL 111 110  CO2 14* 19*  GLUCOSE 117* 94  BUN 17 15  CREATININE 1.58* 1.54*  CALCIUM 10.2 9.9    GFR: CrCl cannot be calculated (Unknown ideal weight.).  Liver Function Tests:  Recent Labs Lab 09/02/15 1206  AST 44*  ALT 34  ALKPHOS 75  BILITOT 1.0  PROT 7.2  ALBUMIN 3.6    Recent Labs Lab 09/02/15 1325  LIPASE 61*   No results for input(s): AMMONIA in the last 168  hours.  Coagulation Profile: No results for input(s): INR, PROTIME in the last 168 hours.  Cardiac Enzymes: No results for input(s): CKTOTAL, CKMB, CKMBINDEX, TROPONINI in the last 168 hours.  BNP (last 3 results) No results for input(s): PROBNP in the last 8760 hours.  HbA1C: No results for input(s): HGBA1C in the last 72 hours.  CBG: No results for input(s): GLUCAP in the last 168 hours.  Lipid Profile: No results for input(s): CHOL, HDL, LDLCALC, TRIG, CHOLHDL, LDLDIRECT in the last 72 hours.  Thyroid Function Tests: No results for input(s): TSH, T4TOTAL, FREET4, T3FREE, THYROIDAB in the last 72 hours.  Anemia Panel: No results for input(s): VITAMINB12, FOLATE, FERRITIN, TIBC, IRON, RETICCTPCT in the last 72 hours.  Urine analysis:    Component Value Date/Time   COLORURINE AMBER* 09/02/2015 1330   APPEARANCEUR CLEAR 09/02/2015 1330   LABSPEC 1.021 09/02/2015 1330   PHURINE 7.0  09/02/2015 1330   GLUCOSEU NEGATIVE 09/02/2015 1330   HGBUR NEGATIVE 09/02/2015 1330   BILIRUBINUR SMALL* 09/02/2015 1330   KETONESUR 15* 09/02/2015 1330   PROTEINUR NEGATIVE 09/02/2015 1330   UROBILINOGEN 2.0* 08/07/2013 1619   NITRITE NEGATIVE 09/02/2015 1330   LEUKOCYTESUR TRACE* 09/02/2015 1330    Sepsis Labs: Lactic Acid, Venous    Component Value Date/Time   LATICACIDVEN 2.00 09/02/2015 1355     Microbiology: No results found for this or any previous visit (from the past 240 hour(s)).    RADIOLOGIC STUDIES ON ADMISSION: Dg Chest 2 View  09/02/2015  CLINICAL DATA:  Dizziness and left-sided chest pain for 2 days. Elevated blood pressure. Episode of vomiting. EXAM: CHEST  2 VIEW COMPARISON:  08/07/2013 FINDINGS: The cardiomediastinal contours are normal. The lungs are clear. Pulmonary vasculature is normal. No consolidation, pleural effusion, or pneumothorax. No acute osseous abnormalities are seen. Surgical clips in the right upper quadrant of the abdomen. IMPRESSION: No acute pulmonary process. Electronically Signed   By: Rubye Oaks M.D.   On: 09/02/2015 12:32   Ct Head Wo Contrast  09/02/2015  CLINICAL DATA:  Dizzy since waking this morning; history of hypertension EXAM: CT HEAD WITHOUT CONTRAST TECHNIQUE: Contiguous axial images were obtained from the base of the skull through the vertex without intravenous contrast. COMPARISON:  08/29/2007 FINDINGS: Moderate diffuse atrophy as seen on the prior study. Mild low attenuation in the deep white matter consistent with chronic small vessel change. No mass or infarct. No hemorrhage or extra-axial fluid. No hydrocephalus. Small mucous retention cysts or polyps in both maxillary sinuses. Minimal mucosal thickening ethmoid air cells bilaterally. Small polyps or retention cysts in the sphenoid sinuses. Frontal sinuses are clear. Posterior right mastoid air cells are opacified. IMPRESSION: Chronic inflammatory change in the sinuses.  Age-related involutional change involving the brain with no acute intracranial abnormality. Electronically Signed   By: Esperanza Heir M.D.   On: 09/02/2015 14:37   Ct Renal Stone Study  09/02/2015  CLINICAL DATA:  Diffuse abdominal pain. Dizziness. Vomiting. Left flank pain. EXAM: CT ABDOMEN AND PELVIS WITHOUT CONTRAST TECHNIQUE: Multidetector CT imaging of the abdomen and pelvis was performed following the standard protocol without IV contrast. COMPARISON:  None. FINDINGS: Lower chest: There is a small hiatal hernia. Lung bases are clear. Heart size is normal. Hepatobiliary: 2 cm cyst in the right lobe of the liver adjacent to the gallbladder fossa. Gallbladder has been removed. No biliary tree dilatation. Pancreas: Normal. Spleen: Normal. Adrenals/Urinary Tract: Adrenal glands are normal. No renal calculi. 15 mm low-density lesion on the tip of  the lower pole of the right kidney, most likely a cyst. Cortical irregularity of the lower pole possibly from previous infarct. There is a 9 mm low-density area in the right upper pole and a 10 mm low-density in the left upper pole. These are not well enough seen characterization. New the ureters and bladder appear normal. Stomach/Bowel: Small hiatal hernia. Otherwise normal including the terminal ileum and appendix. Vascular/Lymphatic: Extensive atherosclerotic disease of the aorta and iliac arteries and common femoral arteries. No adenopathy. Reproductive: Normal. Other: No free air or free fluid. Musculoskeletal: No acute abnormality. Chronic fusion of the sacroiliac joints. IMPRESSION: Small hiatal hernia. Otherwise benign appearing abdomen. Small low-density areas in the upper poles of both kidneys are nonspecific the lower pole cyst on the right was visible are abdominal ultrasound dated 10/15/2004. Renal ultrasound on an elective basis could be used for further characterization. Electronically Signed   By: Francene Boyers M.D.   On: 09/02/2015 14:41    I have  personally reviewed images of chest xray or the CT head  EKG:  Personally reviewed. Normal sinus rhythm  ASSESSMENT AND PLAN: Present on Admission:  . Vertigo/dizziness-along with vomiting: Etiology uncertain, but could be peripheral vertigo. CT head negative, awaiting MRI brain. Check orthostatics, as needed meclizine and antiemetics. PT/vestibular PT evaluation and other supportive measures. Monitor in telemetry overnight, if MRI brain shows a CVA then we will need urology evaluation.   Marland Kitchen History of nonischemic cardiomyopathy: Has prior history of chronic systolic heart failure with EF that has since normalized per prior echo in 2015. Clinically compensated.  . Acute kidney injury on chronic renal disease stage III: Slightly elevated creatinine than usual baseline, resume Lasix tomorrow. Hold ARB for now. Suspect AK I likely secondary to prerenal azotemia in a setting of vomiting and concurrent diuretic usage. Will gently hydrate for a few hours and recheck electrolytes in a.m.  Marland Kitchen GERD (gastroesophageal reflux disease): Continue PPI.  Marland Kitchen Hypertension: BP initially elevated in the ED, but now much improved. Resume Coreg, hydralazine, Imdur. Resume Lasix from tomorrow. Aldactone and benazepril currently on hold resume when able.  Further plan will depend as patient's clinical course evolves and further radiologic and laboratory data become available. Patient will be monitored closely.  Above noted plan was discussed with patient face to face at bedside, he was in agreement.   CONSULTS: None  DVT Prophylaxis: Prophylactic Heparin   Code Status: Full Code  Disposition Plan:  Discharge back home likley in am  Admission status: obs going to tele  Total time spent 45 minutes.Greater than 50% of this time was spent in counseling, explanation of diagnosis, planning of further management, and coordination of care.  [facility] Pager [phone]  If 7PM-7AM,  please contact night-coverage [email or web address] Password [facility] 09/02/2015, 6:09 PM

## 2015-09-02 NOTE — ED Notes (Signed)
Brought patient back to room via wheelchair; patient in gown, on continuous pulse oximetry and blood pressure cuff; visitor at bedside

## 2015-09-02 NOTE — ED Notes (Signed)
Attempted report x1. 

## 2015-09-02 NOTE — ED Notes (Signed)
Pt transported to MRI 

## 2015-09-02 NOTE — ED Notes (Signed)
Error in entering time - lipase and d-dimer collected at 1325

## 2015-09-02 NOTE — ED Notes (Signed)
Pt ambulatory w/ steady gait to restroom. 

## 2015-09-02 NOTE — ED Notes (Signed)
Visual acuity performed at 10 feet; Criss Alvine, MD aware

## 2015-09-03 DIAGNOSIS — R112 Nausea with vomiting, unspecified: Secondary | ICD-10-CM

## 2015-09-03 DIAGNOSIS — G459 Transient cerebral ischemic attack, unspecified: Secondary | ICD-10-CM | POA: Diagnosis not present

## 2015-09-03 DIAGNOSIS — R1013 Epigastric pain: Secondary | ICD-10-CM

## 2015-09-03 DIAGNOSIS — G8929 Other chronic pain: Secondary | ICD-10-CM | POA: Diagnosis not present

## 2015-09-03 DIAGNOSIS — K219 Gastro-esophageal reflux disease without esophagitis: Secondary | ICD-10-CM

## 2015-09-03 DIAGNOSIS — N189 Chronic kidney disease, unspecified: Secondary | ICD-10-CM | POA: Diagnosis not present

## 2015-09-03 DIAGNOSIS — R42 Dizziness and giddiness: Secondary | ICD-10-CM | POA: Diagnosis not present

## 2015-09-03 LAB — CBC
HEMATOCRIT: 42.1 % (ref 39.0–52.0)
HEMOGLOBIN: 14.5 g/dL (ref 13.0–17.0)
MCH: 26.2 pg (ref 26.0–34.0)
MCHC: 34.4 g/dL (ref 30.0–36.0)
MCV: 76.1 fL — ABNORMAL LOW (ref 78.0–100.0)
Platelets: 209 10*3/uL (ref 150–400)
RBC: 5.53 MIL/uL (ref 4.22–5.81)
RDW: 14.3 % (ref 11.5–15.5)
WBC: 6.9 10*3/uL (ref 4.0–10.5)

## 2015-09-03 LAB — BASIC METABOLIC PANEL
ANION GAP: 12 (ref 5–15)
BUN: 15 mg/dL (ref 6–20)
CO2: 18 mmol/L — AB (ref 22–32)
Calcium: 9.7 mg/dL (ref 8.9–10.3)
Chloride: 114 mmol/L — ABNORMAL HIGH (ref 101–111)
Creatinine, Ser: 1.61 mg/dL — ABNORMAL HIGH (ref 0.61–1.24)
GFR calc Af Amer: 51 mL/min — ABNORMAL LOW (ref >60)
GFR, EST NON AFRICAN AMERICAN: 44 mL/min — AB (ref >60)
GLUCOSE: 89 mg/dL (ref 65–99)
POTASSIUM: 3.5 mmol/L (ref 3.5–5.1)
Sodium: 144 mmol/L (ref 135–145)

## 2015-09-03 MED ORDER — PANTOPRAZOLE SODIUM 40 MG PO TBEC
40.0000 mg | DELAYED_RELEASE_TABLET | Freq: Every day | ORAL | Status: DC
  Administered 2015-09-03: 40 mg via ORAL
  Filled 2015-09-03: qty 1

## 2015-09-03 MED ORDER — MECLIZINE HCL 25 MG PO TABS
25.0000 mg | ORAL_TABLET | Freq: Three times a day (TID) | ORAL | Status: DC | PRN
  Filled 2015-09-03: qty 1

## 2015-09-03 MED ORDER — HYDROCODONE-ACETAMINOPHEN 5-325 MG PO TABS: 1.0000 | ORAL_TABLET | ORAL | Status: DC | PRN

## 2015-09-03 MED ORDER — PANTOPRAZOLE SODIUM 40 MG IV SOLR
40.0000 mg | Freq: Two times a day (BID) | INTRAVENOUS | Status: DC
  Administered 2015-09-03 – 2015-09-05 (×5): 40 mg via INTRAVENOUS
  Filled 2015-09-03 (×5): qty 40

## 2015-09-03 MED ORDER — HEPARIN SODIUM (PORCINE) 5000 UNIT/ML IJ SOLN
5000.0000 [IU] | Freq: Three times a day (TID) | INTRAMUSCULAR | Status: DC
  Administered 2015-09-03 – 2015-09-06 (×10): 5000 [IU] via SUBCUTANEOUS
  Filled 2015-09-03 (×10): qty 1

## 2015-09-03 MED ORDER — SODIUM CHLORIDE 0.9% FLUSH
3.0000 mL | Freq: Two times a day (BID) | INTRAVENOUS | Status: DC
  Administered 2015-09-03 – 2015-09-06 (×7): 3 mL via INTRAVENOUS

## 2015-09-03 MED ORDER — CARVEDILOL 12.5 MG PO TABS
12.5000 mg | ORAL_TABLET | Freq: Two times a day (BID) | ORAL | Status: DC
  Administered 2015-09-03 – 2015-09-06 (×8): 12.5 mg via ORAL
  Filled 2015-09-03 (×8): qty 1

## 2015-09-03 MED ORDER — SPIRONOLACTONE 25 MG PO TABS
12.5000 mg | ORAL_TABLET | Freq: Two times a day (BID) | ORAL | Status: DC
  Administered 2015-09-03 – 2015-09-06 (×7): 12.5 mg via ORAL
  Filled 2015-09-03 (×7): qty 1

## 2015-09-03 MED ORDER — ONDANSETRON HCL 4 MG PO TABS
4.0000 mg | ORAL_TABLET | Freq: Four times a day (QID) | ORAL | Status: DC
  Administered 2015-09-03 – 2015-09-06 (×13): 4 mg via ORAL
  Filled 2015-09-03 (×14): qty 1

## 2015-09-03 MED ORDER — ASPIRIN EC 325 MG PO TBEC
325.0000 mg | DELAYED_RELEASE_TABLET | Freq: Every day | ORAL | Status: DC
  Administered 2015-09-03 – 2015-09-06 (×4): 325 mg via ORAL
  Filled 2015-09-03 (×5): qty 1

## 2015-09-03 MED ORDER — ALBUTEROL SULFATE (2.5 MG/3ML) 0.083% IN NEBU: 2.5000 mg | INHALATION_SOLUTION | RESPIRATORY_TRACT | Status: DC | PRN

## 2015-09-03 MED ORDER — SODIUM CHLORIDE 0.9 % IV SOLN
INTRAVENOUS | Status: AC
  Administered 2015-09-03: 05:00:00 via INTRAVENOUS

## 2015-09-03 MED ORDER — ONDANSETRON HCL 4 MG/2ML IJ SOLN
4.0000 mg | Freq: Four times a day (QID) | INTRAMUSCULAR | Status: DC
  Filled 2015-09-03: qty 2

## 2015-09-03 MED ORDER — HYDRALAZINE HCL 25 MG PO TABS
25.0000 mg | ORAL_TABLET | Freq: Three times a day (TID) | ORAL | Status: DC
  Administered 2015-09-03 (×2): 25 mg via ORAL
  Filled 2015-09-03: qty 1

## 2015-09-03 MED ORDER — CHLORHEXIDINE GLUCONATE 0.12 % MT SOLN
15.0000 mL | Freq: Two times a day (BID) | OROMUCOSAL | Status: DC
  Administered 2015-09-03 – 2015-09-06 (×8): 15 mL via OROMUCOSAL
  Filled 2015-09-03 (×8): qty 15

## 2015-09-03 MED ORDER — ISOSORBIDE DINITRATE 20 MG PO TABS
20.0000 mg | ORAL_TABLET | Freq: Three times a day (TID) | ORAL | Status: DC
  Administered 2015-09-03 – 2015-09-06 (×11): 20 mg via ORAL
  Filled 2015-09-03 (×12): qty 1

## 2015-09-03 MED ORDER — FUROSEMIDE 40 MG PO TABS
40.0000 mg | ORAL_TABLET | Freq: Every day | ORAL | Status: DC
  Administered 2015-09-03 – 2015-09-06 (×4): 40 mg via ORAL
  Filled 2015-09-03 (×4): qty 1

## 2015-09-03 MED ORDER — HYDRALAZINE HCL 50 MG PO TABS
50.0000 mg | ORAL_TABLET | Freq: Three times a day (TID) | ORAL | Status: DC
  Filled 2015-09-03: qty 1

## 2015-09-03 MED ORDER — CETYLPYRIDINIUM CHLORIDE 0.05 % MT LIQD
7.0000 mL | Freq: Two times a day (BID) | OROMUCOSAL | Status: DC
  Administered 2015-09-03 – 2015-09-06 (×7): 7 mL via OROMUCOSAL

## 2015-09-03 MED ORDER — HYDRALAZINE HCL 25 MG PO TABS
25.0000 mg | ORAL_TABLET | Freq: Three times a day (TID) | ORAL | Status: DC
  Administered 2015-09-03 – 2015-09-06 (×9): 25 mg via ORAL
  Filled 2015-09-03 (×9): qty 1

## 2015-09-03 MED ORDER — ACETAMINOPHEN 325 MG PO TABS: 650.0000 mg | ORAL_TABLET | Freq: Four times a day (QID) | ORAL | Status: DC | PRN

## 2015-09-03 MED ORDER — ACETAMINOPHEN 650 MG RE SUPP: 650.0000 mg | Freq: Four times a day (QID) | RECTAL | Status: DC | PRN

## 2015-09-03 MED ORDER — DIGOXIN 125 MCG PO TABS
62.5000 ug | ORAL_TABLET | Freq: Every day | ORAL | Status: DC
  Administered 2015-09-03 – 2015-09-06 (×5): 62.5 ug via ORAL
  Filled 2015-09-03 (×5): qty 1

## 2015-09-03 NOTE — Progress Notes (Addendum)
PROGRESS NOTE    CAP MASSI  ZOX:[id number] DOB: 03/07/1951 DOA: 09/02/2015 PCP: No PCP Per Patient  Outpatient Specialists: Dr. Daphene Jaeger ( cardiology)    Brief Narrative:  Thomas Bullock is a 65 y.o. male with medical history significant of nonischemic cardiomyopathy (previously with low EF-but last echo in 2015 with normalized EF), hypertension who presented to the ED with vertigo, vomiting since yesterday evening. Patient is a poor historian-but apparently since yesterday evening he started developing dizziness and occasional lightheadedness. He claims his gait was unsteady and he had to hold onto objects to get around. He subsequently started having vomiting-nonbloody/nonbilious today. He claims he vomited around 3 times. Because of these symptoms he presented to the ED for further evaluation and treatment.  09/03/15: Patient reports much longer course of vomiting, with vomiting after meals, epigastric pain, nausea, indigestion, worsening of symptoms on lying flat and pain in the chest with lying flat.  GI consulted. IV PPI.     Assessment & Plan:   GERD with hematemesis - DDx includes mucosal injury due to retching, gastritis, PUD.  He is on PPI at home and thinks he is taking it.  He is not clear if he is taking NSAIDs and he does drink ETOH sometimes per him.  He has a very interesting syntax and it is difficult to get a linear history from him - IV PPI for at least today, changed back to NPO - GI consult, though with normal H/H and BUN, significant upper GI bleed unlikely - Zofran for nausea - Vicoden for pain, short course only - CBC in the AM  Vertigo/diplopia - Continue work up with PT today and other supportive measures, PT evaluation noted main symptom to be diplopia with improvement with covering one or the other eye. Binocular diplopia usually from extraocular muscle involvement.  - Unclear course, which came first unsteadiness or vomiting.   - BP is maintained,  Check orthostatics - Small syrinx seen on MRI of the brain in the C spine, I do not think this would be causing his symptoms unless it extended into the midbrain, also discussed briefly with Neurology and if no CVA on MRI, these findings (including possible vertebral artery occlusion) should not be causing symptoms unless vertigo is purely positional, such as from sitting to standing up.  He stood up for me on exam today and did not have recurrence of dizziness.   - If work up above not revealing, would consider C spine MRI.  - Check A1C, HIV, TSH - Interestingly, this morning, patient had recurrence of symptoms with diplopia while his BP was very high.  I wonder if these symptoms are related to wide swings in BP?  He is not certain how or which meds he takes on interview.   AKI on Chronic renal insufficiency, stage III  - Renal function slightly worse today - Continue IVF while NPO, NS at 100cc/hr - Monitor for volume overload given CHF history, although EF normalized - BMET in the AM  HTN - Resumed home medications of coreg, hydralazine, imdur - Restart aldactone given above, increase hydralazine to  q8 hours - Hold lisinopril given renal function  Patient's dizziness improved, but still with nausea and vomiting. Continue plan as noted above   DVT prophylaxis: Heparin SQ  Code Status: Full   Disposition Plan: Pending above work up    Consultants:   GI  Procedures:   none  Antimicrobials:   none    Subjective: He is  reporting emesis X 2 overnight, no further dizziness.  Reports many years history of "indigestion" and occasional vomiting after meals.  He also reports epigastric pain and pain in his chest when he lies down. The emesis comes on when lying down.  He has taken "some medications" friends give him for indigestion and pain (could not completely assess for NSAIDs) and he does drink ETOH but "not too much."    Objective: Filed Vitals:   09/02/15 1815 09/02/15  2017 09/02/15 2018 09/03/15 0425  BP: 190/95 189/119 186/116 162/90  Pulse: 96 79 86 73  Temp:  97.8 F (36.6 C)  97.9 F (36.6 C)  TempSrc:  Oral  Oral  Resp: 25 18  16   Height:  5\' 7"  (1.702 m)    Weight:  168 lb 11.2 oz (76.522 kg)    SpO2: 100% 100% 100% 99%    Intake/Output Summary (Last 24 hours) at 09/03/15 0728 Last data filed at 09/03/15 4098  Gross per 24 hour  Intake 104.17 ml  Output    300 ml  Net -195.83 ml   Filed Weights   09/02/15 2017  Weight: 168 lb 11.2 oz (76.522 kg)    Examination:  General exam: Appears calm and comfortable, no conjunctival pallor noted Eyes: Pupillary response equal to light and accomodation, no EOM abnormalities noted.  He did have fast beating nystagmus to the left and slow beating to the right.  Respiratory system: Clear to auscultation. Respiratory effort normal. Cardiovascular system: S1 & S2 heard, RR, NR. No murmurs Gastrointestinal system: Abdomen is nondistended, soft and currently nontender.  Normal bowel sounds heard. Central nervous system: Alert and oriented. No focal neurological deficits. Extremities: Symmetric 5 x 5 power. Linear blackening to nails bilaterally Psychiatry: Judgement appears normal and insight decreased, likely due to low health literacy. Mood & affect appropriate.     Data Reviewed: I have personally reviewed following labs and imaging studies  CBC:  Recent Labs Lab 09/02/15 1206 09/03/15 0236  WBC 7.2 6.9  HGB 15.0 14.5  HCT 43.0 42.1  MCV 77.2* 76.1*  PLT 241 209   Basic Metabolic Panel:  Recent Labs Lab 09/02/15 1206 09/02/15 1515 09/03/15 0236  NA 139 142 144  K 3.6 3.7 3.5  CL 111 110 114*  CO2 14* 19* 18*  GLUCOSE 117* 94 89  BUN 17 15 15   CREATININE 1.58* 1.54* 1.61*  CALCIUM 10.2 9.9 9.7   GFR: Estimated Creatinine Clearance: 43.3 mL/min (by C-G formula based on Cr of 1.61). Liver Function Tests:  Recent Labs Lab 09/02/15 1206  AST 44*  ALT 34  ALKPHOS 75   BILITOT 1.0  PROT 7.2  ALBUMIN 3.6    Recent Labs Lab 09/02/15 1325  LIPASE 61*   Urine analysis:    Component Value Date/Time   COLORURINE AMBER* 09/02/2015 1330   APPEARANCEUR CLEAR 09/02/2015 1330   LABSPEC 1.021 09/02/2015 1330   PHURINE 7.0 09/02/2015 1330   GLUCOSEU NEGATIVE 09/02/2015 1330   HGBUR NEGATIVE 09/02/2015 1330   BILIRUBINUR SMALL* 09/02/2015 1330   KETONESUR 15* 09/02/2015 1330   PROTEINUR NEGATIVE 09/02/2015 1330   UROBILINOGEN 2.0* 08/07/2013 1619   NITRITE NEGATIVE 09/02/2015 1330   LEUKOCYTESUR TRACE* 09/02/2015 1330      Radiology Studies: Dg Chest 2 View  09/02/2015  CLINICAL DATA:  Dizziness and left-sided chest pain for 2 days. Elevated blood pressure. Episode of vomiting. EXAM: CHEST  2 VIEW COMPARISON:  08/07/2013 FINDINGS: The cardiomediastinal contours are normal. The lungs  are clear. Pulmonary vasculature is normal. No consolidation, pleural effusion, or pneumothorax. No acute osseous abnormalities are seen. Surgical clips in the right upper quadrant of the abdomen. IMPRESSION: No acute pulmonary process. Electronically Signed   By: Rubye Oaks M.D.   On: 09/02/2015 12:32   Ct Head Wo Contrast  09/02/2015  CLINICAL DATA:  Dizzy since waking this morning; history of hypertension EXAM: CT HEAD WITHOUT CONTRAST TECHNIQUE: Contiguous axial images were obtained from the base of the skull through the vertex without intravenous contrast. COMPARISON:  08/29/2007 FINDINGS: Moderate diffuse atrophy as seen on the prior study. Mild low attenuation in the deep white matter consistent with chronic small vessel change. No mass or infarct. No hemorrhage or extra-axial fluid. No hydrocephalus. Small mucous retention cysts or polyps in both maxillary sinuses. Minimal mucosal thickening ethmoid air cells bilaterally. Small polyps or retention cysts in the sphenoid sinuses. Frontal sinuses are clear. Posterior right mastoid air cells are opacified. IMPRESSION:  Chronic inflammatory change in the sinuses. Age-related involutional change involving the brain with no acute intracranial abnormality. Electronically Signed   By: Esperanza Heir M.D.   On: 09/02/2015 14:37   Mr Brain Wo Contrast  09/02/2015  CLINICAL DATA:  Dizziness since yesterday evening. Assessment for posterior circulation stroke. EXAM: MRI HEAD WITHOUT CONTRAST TECHNIQUE: Multiplanar, multiecho pulse sequences of the brain and surrounding structures were obtained without intravenous contrast. COMPARISON:  Head CT 09/02/2015 FINDINGS: Limited visualization of the upper cervical spine demonstrates a thin band of curvilinear hypointensity centrally in the visualized cervical spinal cord on the sagittal T1 sequence (series 3, image 12). The axial sequences do not extend through this level, and the upper cervical spinal cord is only visualized on a single coronal T2 image without abnormality identified. The cerebellar tonsils are normally positioned. There is no evidence of acute infarct, mass, midline shift, or extra-axial fluid collection. A chronic microhemorrhage is noted along the medial margin of the left caudate head. T2 hyperintensities in the subcortical and periventricular cerebral white matter bilaterally are nonspecific but compatible with moderate chronic small vessel ischemic disease. There is mild cerebral atrophy. Orbits are unremarkable. Small bilateral maxillary sinus mucous retention cysts and a small right mastoid effusion are noted. The distal right vertebral artery flow void is abnormal in appearance. Other major intracranial vascular flow voids are preserved. IMPRESSION: 1. No acute infarct. 2. Moderate chronic small vessel ischemic disease. 3. Abnormal distal right vertebral artery which may reflect diminished flow from a proximal stenosis or occlusion. 4. Small syrinx versus artifact in the upper cervical spinal cord. Cervical spine MRI suggested for further evaluation. Electronically  Signed   By: Sebastian Ache M.D.   On: 09/02/2015 20:07   Ct Renal Stone Study  09/02/2015  CLINICAL DATA:  Diffuse abdominal pain. Dizziness. Vomiting. Left flank pain. EXAM: CT ABDOMEN AND PELVIS WITHOUT CONTRAST TECHNIQUE: Multidetector CT imaging of the abdomen and pelvis was performed following the standard protocol without IV contrast. COMPARISON:  None. FINDINGS: Lower chest: There is a small hiatal hernia. Lung bases are clear. Heart size is normal. Hepatobiliary: 2 cm cyst in the right lobe of the liver adjacent to the gallbladder fossa. Gallbladder has been removed. No biliary tree dilatation. Pancreas: Normal. Spleen: Normal. Adrenals/Urinary Tract: Adrenal glands are normal. No renal calculi. 15 mm low-density lesion on the tip of the lower pole of the right kidney, most likely a cyst. Cortical irregularity of the lower pole possibly from previous infarct. There is a 9 mm low-density  area in the right upper pole and a 10 mm low-density in the left upper pole. These are not well enough seen characterization. New the ureters and bladder appear normal. Stomach/Bowel: Small hiatal hernia. Otherwise normal including the terminal ileum and appendix. Vascular/Lymphatic: Extensive atherosclerotic disease of the aorta and iliac arteries and common femoral arteries. No adenopathy. Reproductive: Normal. Other: No free air or free fluid. Musculoskeletal: No acute abnormality. Chronic fusion of the sacroiliac joints. IMPRESSION: Small hiatal hernia. Otherwise benign appearing abdomen. Small low-density areas in the upper poles of both kidneys are nonspecific the lower pole cyst on the right was visible are abdominal ultrasound dated 10/15/2004. Renal ultrasound on an elective basis could be used for further characterization. Electronically Signed   By: Francene Boyers M.D.   On: 09/02/2015 14:41     Scheduled Meds: . antiseptic oral rinse  7 mL Mouth Rinse q12n4p  . aspirin EC  325 mg Oral Daily  . carvedilol   12.5 mg Oral BID WC  . chlorhexidine  15 mL Mouth Rinse BID  . digoxin  62.5 mcg Oral Daily  . furosemide  40 mg Oral Daily  . heparin  5,000 Units Subcutaneous Q8H  . hydrALAZINE  25 mg Oral Q8H  . isosorbide dinitrate  20 mg Oral TID  . pantoprazole  40 mg Oral Daily  . sodium chloride flush  3 mL Intravenous Q12H   Continuous Infusions:      Time spent: 30 minutes    Debe Coder, MD [facility] Pager [phone]  If 7PM-7AM, please contact night-coverage [email or web address] Password [facility] 09/03/2015, 7:28 AM

## 2015-09-03 NOTE — Evaluation (Signed)
Physical Therapy Evaluation Patient Details Name: Thomas Bullock MRN: [id number] DOB: 01-31-1951 Today's Date: 09/03/2015   History of Present Illness  Thomas Bullock is a 65 y.o. male with medical history significant of nonischemic cardiomyopathy (previously with low EF-but last echo in 2015 with normalized EF), hypertension who presented to the ED with vertigo, vomiting since yesterday evening. Patient is a poor historian-but apparently since yesterday evening he started developing dizziness and occasional lightheadedness. He claims his gait was unsteady and he had to hold onto objects to get around. He subsequently started having vomiting-nonbloody/nonbilious today.   Clinical Impression  Pt admitted with above. Pt tested negative for BPPV but does show a vestibular hypofunction as he is unable to maintain focus with head turns. Pt con't to see double unless pt covers a eye in which he then sees normal. Acute PT to con't to follow.    Follow Up Recommendations Outpatient PT (for vestibular if continued issue)    Equipment Recommendations  None recommended by PT (may need RW)    Recommendations for Other Services       Precautions / Restrictions Precautions Precautions: Fall Restrictions Weight Bearing Restrictions: No      Mobility  Bed Mobility Overal bed mobility: Modified Independent             General bed mobility comments: increased time, used bed rail  Transfers Overall transfer level: Needs assistance Equipment used: 1 person hand held assist Transfers: Sit to/from Stand Sit to Stand: Min assist         General transfer comment: minA to steady pt as pt seeing double and reaching for objects for stability  Ambulation/Gait Ambulation/Gait assistance: Min assist;Mod assist;+2 physical assistance Ambulation Distance (Feet): 150 Feet Assistive device: 2 person hand held assist Gait Pattern/deviations: Step-through pattern;Decreased stride length Gait  velocity: decreaed Gait velocity interpretation: Below normal speed for age/gender General Gait Details: very guarded due to double vision. when asked pt to cover 1 eye double vision resolved and pt became more steady. Improvement with either eye, R or L. pt otherwise sees double  Information systems manager [place] (Stroke Patients Only)       Balance Overall balance assessment: Needs assistance Sitting-balance support: Feet supported;No upper extremity supported Sitting balance-Leahy Scale: Fair Sitting balance - Comments: pt able to don socks sitting in chair   Standing balance support: Single extremity supported Standing balance-Leahy Scale: Poor Standing balance comment: due to double vision pt either need to close/cover one eye or hold onto something                             Pertinent Vitals/Pain Pain Assessment: 0-10 Pain Score: 3  Pain Location: abdominal    Home Living Family/patient expects to be discharged to:: Private residence Living Arrangements: Spouse/significant other Available Help at Discharge: Family;Available 24 hours/day Type of Home: Apartment Home Access: Stairs to enter Entrance Stairs-Rails: Right Entrance Stairs-Number of Steps: flight2nd floor                                                                Home Layout: One level Home Equipment: None  Prior Function Level of Independence: Independent               Hand Dominance   Dominant Hand: Right    Extremity/Trunk Assessment   Upper Extremity Assessment: Overall WFL for tasks assessed           Lower Extremity Assessment: Overall WFL for tasks assessed      Cervical / Trunk Assessment: Normal  Communication   Communication: No difficulties  Cognition Arousal/Alertness: Awake/alert Behavior During Therapy: WFL for tasks assessed/performed Overall Cognitive Status: Within Functional Limits for tasks assessed                       General Comments General comments (skin integrity, edema, etc.): [place] eval: pt with nystaggmus at end gaze when tracking L/R. Pt with no report of symptoms and was neg for nystagmus during BPPV testing for both horizontal and posterior canals. pt with difficulty maintaining focus on PTs nose during head thrust, pt with saccadic mvmts    Exercises Other Exercises Other Exercises: provided VORx1 exercises to do in sitting 3reps x 1 min (or what he can tolerate) 3x/day. pt returned demonstrated      Assessment/Plan    PT Assessment Patient needs continued PT services  PT Diagnosis Difficulty walking   PT Problem List Decreased strength;Decreased activity tolerance;Decreased balance;Decreased mobility;Decreased coordination  PT Treatment Interventions DME instruction;Gait training;Functional mobility training;Therapeutic activities;Therapeutic exercise;Balance training;Stair training;Other (comment) (vestibular rehab)   PT Goals (Current goals can be found in the Care Plan section) Acute Rehab PT Goals Patient Stated Goal: stop the pain and dizziness PT Goal Formulation: With patient Time For Goal Achievement: 09/10/15 Potential to Achieve Goals: Good    Frequency Min 3X/week   Barriers to discharge        Co-evaluation               End of Session Equipment Utilized During Treatment: Gait belt Activity Tolerance: Patient tolerated treatment well Patient left: in chair;with call bell/phone within reach Nurse Communication: Mobility status    Functional Assessment Tool Used: clinical judgement Functional Limitation: Mobility: Walking and moving around Mobility: Walking and Moving Around Current Status [phone]): At least 20 percent but less than 40 percent impaired, limited or restricted Mobility: Walking and Moving Around Goal Status [phone]): At least 1 percent but less than 20 percent impaired, limited or restricted    Time: 0786-7544 PT Time  Calculation (min) (ACUTE ONLY): 25 min   Charges:   PT Evaluation $PT Eval Moderate Complexity: 1 Procedure PT Treatments $Gait Training: 8-22 mins   PT G Codes:   PT G-Codes **NOT FOR INPATIENT CLASS** Functional Assessment Tool Used: clinical judgement Functional Limitation: Mobility: Walking and moving around Mobility: Walking and Moving Around Current Status (B2010): At least 20 percent but less than 40 percent impaired, limited or restricted Mobility: Walking and Moving Around Goal Status [phone]): At least 1 percent but less than 20 percent impaired, limited or restricted    Marcene Brawn 09/03/2015, 3:08 PM   Lewis Shock, PT, DPT Pager #: [phone] Office #: [phone]

## 2015-09-03 NOTE — Progress Notes (Signed)
PT Cancellation Note  Patient Details Name: LOYD EURICH MRN: [id number] DOB: [birth year]   Cancelled Treatment:    Reason Eval/Treat Not Completed: Patient not medically ready. Spoke with RN, pt's BP is 198/102 in lying. Pt with abdominal pain and nausea. RN asked to hold at this time. PT to return as able.   Marcene Brawn 09/03/2015, 9:25 AM   Lewis Shock, PT, DPT Pager #: [phone] Office #: [phone]

## 2015-09-03 NOTE — Consult Note (Signed)
Consultation  Referring Provider: [facility] hospitalist - Criselda Peaches  Primary Care Physician:  No PCP Per Patient Primary Gastroenterologist:  None/unassigned  Reason for Consultation:  Nausea/vomiting/ abdominal pain  HPI: Thomas Bullock is a 65 y.o. male  who was admitted through the emergency room last night after presenting with complaints of dizziness and lightheadedness over the past 2 days. He says he has been having problems with his balance but had not fallen. No headache fever etc. Patient has history of hypertension, nonischemic cardiomyopathy with markedly reduced EF in 2013 which has since recovered to normal LV function. He is status post laparoscopic cholecystectomy, and has chronic renal insufficiency. Initially on admission patient had complained of nausea and vomiting and it was thought this recent related to dizziness etc. but on further history he states he has been having problems with mid abdominal pain intermittently over the past couple of months and she describes as a "hard pain". This is not present everyday but very frequently. He has also been having intermittent nausea and vomiting usually immediately postprandially. Complains of a lot of heartburn and indigestion but no dysphagia. Appetite has been good and weight has been stable. He has not noted any melena or hematochezia. He does drink alcohol but not on a daily basis, he says his girlfriend gives him Advil very frequently when he complains of pain. Dizziness is better since admission but not resolved and he says he still feels a bit lightheaded he had had some blurry vision which has improved but not resolved as well. Workup thus far with CT of the head unremarkable, MRI of the head without contrast showed no acute infarct or chronic small vessel ischemia, and abnormal distal right vertebral artery question proximal stenosis or occlusion and a small syrinx versus artifact in the upper cervical spinal canal. Cervical  spine MRI was recommended. CT of the abdomen was done with no IV or oral contrast was negative with the exception of a small hiatal hernia, no acute findings.   Past Medical History  Diagnosis Date  . Hypertension   . Acute pulmonary edema (HCC) 10/04/2011  . LVH (left ventricular hypertrophy) 10/04/2011  . Systolic CHF (HCC) 11/07/2011    Hospitalized for flash pulmonary edema in June 2012 due to malignant HTN.   EF was 20-25% with severe global hypokinesis. left heart cath which revealed an LAD with 20% smooth narrowing but otherwise normal coronaries. On ace inhibitor, spironalactone, carvedilol. Diuresed in hospital.  Life Vest was ordered and fitted by [employer] to wear until September.     Marland Kitchen GERD (gastroesophageal reflux disease) 10/04/2011  . Coronary artery disease     Past Surgical History  Procedure Laterality Date  . Dental surgery    . Cholecystectomy, laparoscopic  2006  . Cholecystectomy    . Left heart catheterization with coronary angiogram N/A 10/05/2011    Procedure: LEFT HEART CATHETERIZATION WITH CORONARY ANGIOGRAM;  Surgeon: Lennette Bihari, MD;  Location: [facility] CATH LAB;  Service: Cardiovascular;  Laterality: N/A;  . Right heart catheterization  10/05/2011    Procedure: RIGHT HEART CATH;  Surgeon: Lennette Bihari, MD;  Location: [facility] CATH LAB;  Service: Cardiovascular;;    Prior to Admission medications   Medication Sig Start Date End Date Taking? Authorizing Provider  benazepril (LOTENSIN) 40 MG tablet TAKE ONE TABLET BY MOUTH ONCE DAILY 04/20/15  Yes Lennette Bihari, MD  carvedilol (COREG) 12.5 MG tablet TAKE ONE TABLET BY MOUTH TWICE DAILY WITH A MEAL 06/27/15  Yes Lennette Bihari, MD  digoxin (LANOXIN) 0.125 MG tablet TAKE ONE-HALF TABLET BY MOUTH ONCE DAILY 04/20/15  Yes Lennette Bihari, MD  furosemide (LASIX) 40 MG tablet Take 0.5 tablets (20 mg total) by mouth daily. 09/07/14  Yes Lennette Bihari, MD  furosemide (LASIX) 40 MG tablet TAKE ONE TABLET BY MOUTH ONCE DAILY 07/05/15  Yes  Lennette Bihari, MD  hydrALAZINE (APRESOLINE) 25 MG tablet TAKE ONE AND ONE-HALF TABLETS BY MOUTH THREE TIMES DAILY 08/01/15  Yes Lennette Bihari, MD  isosorbide dinitrate (ISORDIL) 20 MG tablet TAKE ONE TABLET BY MOUTH THREE TIMES DAILY 05/23/15  Yes Lennette Bihari, MD  spironolactone (ALDACTONE) 25 MG tablet TAKE ONE-HALF TABLET BY MOUTH TWICE DAILY 10/21/14  Yes Lennette Bihari, MD  pantoprazole (PROTONIX) 40 MG tablet Take 1 tablet (40 mg total) by mouth daily. Patient not taking: Reported on 09/02/2015 02/28/15   Lennette Bihari, MD    Current Facility-Administered Medications  Medication Dose Route Frequency Provider Last Rate Last Dose  . acetaminophen (TYLENOL) tablet 650 mg  650 mg Oral Q6H PRN Shanker Levora Dredge, MD       Or  . acetaminophen (TYLENOL) suppository 650 mg  650 mg Rectal Q6H PRN Shanker Levora Dredge, MD      . albuterol (PROVENTIL) (2.5 MG/3ML) 0.083% nebulizer solution 2.5 mg  2.5 mg Nebulization Q2H PRN Shanker Levora Dredge, MD      . antiseptic oral rinse (CPC / CETYLPYRIDINIUM CHLORIDE 0.05%) solution 7 mL  7 mL Mouth Rinse q12n4p Leroy Sea, MD      . aspirin EC tablet 325 mg  325 mg Oral Daily Maretta Bees, MD   325 mg at 09/03/15 0434  . carvedilol (COREG) tablet 12.5 mg  12.5 mg Oral BID WC Shanker Levora Dredge, MD      . chlorhexidine (PERIDEX) 0.12 % solution 15 mL  15 mL Mouth Rinse BID Leroy Sea, MD   15 mL at 09/03/15 0918  . digoxin (LANOXIN) tablet 62.5 mcg  62.5 mcg Oral Daily Maretta Bees, MD   62.5 mcg at 09/03/15 0917  . furosemide (LASIX) tablet 40 mg  40 mg Oral Daily Maretta Bees, MD   40 mg at 09/03/15 0917  . heparin injection 5,000 Units  5,000 Units Subcutaneous Q8H Shanker Levora Dredge, MD      . hydrALAZINE (APRESOLINE) tablet 25 mg  25 mg Oral Q8H Maretta Bees, MD   25 mg at 09/03/15 0432  . HYDROcodone-acetaminophen (NORCO/VICODIN) 5-325 MG per tablet 1 tablet  1 tablet Oral Q4H PRN Inez Catalina, MD      . isosorbide dinitrate  (ISORDIL) tablet 20 mg  20 mg Oral TID Maretta Bees, MD   20 mg at 09/03/15 0918  . meclizine (ANTIVERT) tablet 25 mg  25 mg Oral TID PRN Maretta Bees, MD      . ondansetron [facility]) tablet 4 mg  4 mg Oral Q6H PRN Shanker Levora Dredge, MD       Or  . ondansetron [facility]) injection 4 mg  4 mg Intravenous Q6H PRN Maretta Bees, MD   4 mg at 09/03/15 0852  . pantoprazole (PROTONIX) injection 40 mg  40 mg Intravenous Q12H Inez Catalina, MD   40 mg at 09/03/15 0918  . sodium chloride flush (NS) 0.9 % injection 3 mL  3 mL Intravenous Q12H Maretta Bees, MD   3 mL at 09/03/15 0115  Allergies as of 09/02/2015  . (No Known Allergies)    Family History  Problem Relation Age of Onset  . Kidney disease Mother     34  . Stroke Mother   . Diabetes type II Mother     Patient uncertain but history of multiple amuputations.   . Heart attack Father     2007-died of Heart attack @ age 7  . Heart disease Sister   . Heart disease Brother     Social History   Social History  . Marital Status: Single    Spouse Name: N/A  . Number of Children: N/A  . Years of Education: N/A   Occupational History  . Not on file.   Social History Main Topics  . Smoking status: Former Smoker    Types: Cigarettes    Quit date: 05/07/1973  . Smokeless tobacco: Never Used     Comment: on 11/07/11 patient states never smoker.   . Alcohol Use: No     Comment: no longer drinks since being sick.   . Drug Use: No  . Sexual Activity: Yes   Other Topics Concern  . Not on file   Social History Narrative   Patient with finanacial difficulties. Unable to work at [employer] because of him having to wear lifevest. Will have job opportunity once off in September. Attempting orange card.       High school graduate but expresses has some difficulty reading at times if complicated.    Working at [employer] previously.       Cell phone [phone] not ok to leave message   Home phone [phone]  [phone] ok to leave message.       Lives with girlfriend. No kids. Friend helps with transport.     Review of Systems: Pertinent positive and negative review of systems were noted in the above HPI section.  All other review of systems was otherwise negative.Marland Kitchen  Physical Exam: Vital signs in last 24 hours: Temp:  [97.8 F (36.6 C)-98.4 F (36.9 C)] 97.9 F (36.6 C) (04/29 0425) Pulse Rate:  [54-96] 73 (04/29 0425) Resp:  [16-27] 16 (04/29 0425) BP: (153-190)/(90-119) 162/90 mmHg (04/29 0425) SpO2:  [99 %-100 %] 99 % (04/29 0425) Weight:  [168 lb 11.2 oz (76.522 kg)] 168 lb 11.2 oz (76.522 kg) (04/28 2017) Last BM Date: 09/02/15 General:   Alert,  Well-developed, well-nourished,AA male  pleasant and cooperative in NAD Head:  Normocephalic and atraumatic. Eyes:  Sclera clear, no icterus.   Conjunctiva pink. Ears:  Normal auditory acuity. Nose:  No deformity, discharge,  or lesions. Mouth:  No deformity or lesions.   Neck:  Supple; no masses or thyromegaly. Lungs:  Clear throughout to auscultation.   No wheezes, crackles, or rhonchi. Heart:  Regular rate and rhythm; no murmurs, clicks, rubs,  or gallops. Abdomen:  Soft, mildly tender epigastrium and hypogastrium, BS active,nonpalp mass or hsm.   Rectal:  Deferred  Msk:  Symmetrical without gross deformities. . Pulses:  Normal pulses noted. Extremities:  Without clubbing or edema. Neurologic:  Alert and  oriented x4;  grossly normal neurologically. Skin:  Intact without significant lesions or rashes.. Psych:  Alert and cooperative. Normal mood and affect.  Intake/Output from previous day: 04/28 0701 - 04/29 0700 In: 104.2 [I.V.:104.2] Out: 300 [Urine:300] Intake/Output this shift:    Lab Results:  Recent Labs  09/02/15 1206 09/03/15 0236  WBC 7.2 6.9  HGB 15.0 14.5  HCT 43.0 42.1  PLT 241  209   BMET  Recent Labs  09/02/15 1206 09/02/15 1515 09/03/15 0236  NA 139 142 144  K 3.6 3.7 3.5  CL 111 110 114*  CO2 14*  19* 18*  GLUCOSE 117* 94 89  BUN 17 15 15   CREATININE 1.58* 1.54* 1.61*  CALCIUM 10.2 9.9 9.7   LFT  Recent Labs  09/02/15 1206  PROT 7.2  ALBUMIN 3.6  AST 44*  ALT 34  ALKPHOS 75  BILITOT 1.0   PT/INR No results for input(s): LABPROT, INR in the last 72 hours. Hepatitis Panel No results for input(s): HEPBSAG, HCVAB, HEPAIGM, HEPBIGM in the last 72 hours.    IMPRESSION:   #21  65 year old male with 2 day history of dizziness, lightheadedness, and blurry vision-workup in progress-question vertigo, rule out RIND/TIA. #2 abnormal right vertebral artery on MRI question stenosis or occlusion #3 possible syrinx cervical spinal canal #4  2-3 month history of intermittent nausea vomiting and abdominal pain-etiology not clear, noncontrasted CT negative for acute findings, patient is status post cholecystectomy will need to rule out peptic ulcer disease, occult neoplasm #5 history of nonischemic cardiomyopathy most recent LV function normal  PLAN: Do not feel his acute presentation with dizziness and lightheadedness is due to GI etiology Complete neurovascular workup first-cervical MRI and further workup of right vertebral artery IV PPI twice a day Around the clock Zofran Will plan for EGD, timing dependent on findings of MRI etc.   Amy Esterwood  09/03/2015, 9:31 AM  GI ATTENDING  History, laboratories, x-rays reviewed. Patient seen and examined. Agree with comprehensive consultation note as outlined above. Agree with plans for EGD to evaluate abdominal pain, nausea, and vomiting Will perform after neurovascular workup completed. In the interim recommend PPI and antiemetics  Wilhemina Bonito. Eda Keys., M.D. [facility] Division of Gastroenterology

## 2015-09-04 DIAGNOSIS — H532 Diplopia: Secondary | ICD-10-CM

## 2015-09-04 DIAGNOSIS — K219 Gastro-esophageal reflux disease without esophagitis: Secondary | ICD-10-CM | POA: Diagnosis not present

## 2015-09-04 DIAGNOSIS — G459 Transient cerebral ischemic attack, unspecified: Secondary | ICD-10-CM | POA: Diagnosis not present

## 2015-09-04 DIAGNOSIS — R1013 Epigastric pain: Secondary | ICD-10-CM | POA: Diagnosis not present

## 2015-09-04 DIAGNOSIS — N189 Chronic kidney disease, unspecified: Secondary | ICD-10-CM | POA: Diagnosis not present

## 2015-09-04 DIAGNOSIS — R112 Nausea with vomiting, unspecified: Secondary | ICD-10-CM | POA: Diagnosis not present

## 2015-09-04 LAB — CBC
HEMATOCRIT: 39.6 % (ref 39.0–52.0)
HEMOGLOBIN: 13.4 g/dL (ref 13.0–17.0)
MCH: 26.4 pg (ref 26.0–34.0)
MCHC: 33.8 g/dL (ref 30.0–36.0)
MCV: 78 fL (ref 78.0–100.0)
PLATELETS: 189 10*3/uL (ref 150–400)
RBC: 5.08 MIL/uL (ref 4.22–5.81)
RDW: 14.8 % (ref 11.5–15.5)
WBC: 5.5 10*3/uL (ref 4.0–10.5)

## 2015-09-04 LAB — TSH: TSH: 2.339 u[IU]/mL (ref 0.350–4.500)

## 2015-09-04 LAB — BASIC METABOLIC PANEL
ANION GAP: 8 (ref 5–15)
BUN: 11 mg/dL (ref 6–20)
CHLORIDE: 108 mmol/L (ref 101–111)
CO2: 22 mmol/L (ref 22–32)
Calcium: 9.3 mg/dL (ref 8.9–10.3)
Creatinine, Ser: 1.56 mg/dL — ABNORMAL HIGH (ref 0.61–1.24)
GFR calc Af Amer: 52 mL/min — ABNORMAL LOW (ref >60)
GFR, EST NON AFRICAN AMERICAN: 45 mL/min — AB (ref >60)
GLUCOSE: 94 mg/dL (ref 65–99)
POTASSIUM: 3.6 mmol/L (ref 3.5–5.1)
Sodium: 138 mmol/L (ref 135–145)

## 2015-09-04 NOTE — Progress Notes (Signed)
Patient ID: Thomas Bullock, male   DOB: 1950/05/22, 65 y.o.   MRN: [id number]    Progress Note   Subjective   Sitting up in chair- no GI complaints today, eating without difficulty Was seeing double yesterday - says vision still blurry but not double today, unsteady gait -needing help to ambulate   Objective   Vital signs in last 24 hours: Temp:  [97.4 F (36.3 C)-98.7 F (37.1 C)] 97.6 F (36.4 C) (04/30 0548) Pulse Rate:  [50-56] 52 (04/30 0548) Resp:  [18-20] 18 (04/30 0548) BP: (119-155)/(73-90) 119/76 mmHg (04/30 0548) SpO2:  [99 %-100 %] 100 % (04/30 0548) Last BM Date: 09/02/15 General:    AA male  in NAD Heart:  Regular rate and rhythm; no murmurs Lungs: Respirations even and unlabored, lungs CTA bilaterally Abdomen:  Soft, nontender and nondistended. Normal bowel sounds. Extremities:  Without edema. Neurologic:  Alert and oriented,  grossly normal neurologically. Psych:  Cooperative. Normal mood and affect.  Intake/Output from previous day: 04/29 0701 - 04/30 0700 In: -  Out: 975 [Urine:975] Intake/Output this shift: Total I/O In: 3 [I.V.:3] Out: -   Lab Results:  Recent Labs  09/02/15 1206 09/03/15 0236 09/04/15 0715  WBC 7.2 6.9 5.5  HGB 15.0 14.5 13.4  HCT 43.0 42.1 39.6  PLT 241 209 189   BMET  Recent Labs  09/02/15 1515 09/03/15 0236 09/04/15 0715  NA 142 144 138  K 3.7 3.5 3.6  CL 110 114* 108  CO2 19* 18* 22  GLUCOSE 94 89 94  BUN 15 15 11   CREATININE 1.54* 1.61* 1.56*  CALCIUM 9.9 9.7 9.3   LFT  Recent Labs  09/02/15 1206  PROT 7.2  ALBUMIN 3.6  AST 44*  ALT 34  ALKPHOS 75  BILITOT 1.0   PT/INR No results for input(s): LABPROT, INR in the last 72 hours.  Studies/Results: Dg Chest 2 View  09/02/2015  CLINICAL DATA:  Dizziness and left-sided chest pain for 2 days. Elevated blood pressure. Episode of vomiting. EXAM: CHEST  2 VIEW COMPARISON:  08/07/2013 FINDINGS: The cardiomediastinal contours are normal. The lungs  are clear. Pulmonary vasculature is normal. No consolidation, pleural effusion, or pneumothorax. No acute osseous abnormalities are seen. Surgical clips in the right upper quadrant of the abdomen. IMPRESSION: No acute pulmonary process. Electronically Signed   By: Rubye Oaks M.D.   On: 09/02/2015 12:32   Ct Head Wo Contrast  09/02/2015  CLINICAL DATA:  Dizzy since waking this morning; history of hypertension EXAM: CT HEAD WITHOUT CONTRAST TECHNIQUE: Contiguous axial images were obtained from the base of the skull through the vertex without intravenous contrast. COMPARISON:  08/29/2007 FINDINGS: Moderate diffuse atrophy as seen on the prior study. Mild low attenuation in the deep white matter consistent with chronic small vessel change. No mass or infarct. No hemorrhage or extra-axial fluid. No hydrocephalus. Small mucous retention cysts or polyps in both maxillary sinuses. Minimal mucosal thickening ethmoid air cells bilaterally. Small polyps or retention cysts in the sphenoid sinuses. Frontal sinuses are clear. Posterior right mastoid air cells are opacified. IMPRESSION: Chronic inflammatory change in the sinuses. Age-related involutional change involving the brain with no acute intracranial abnormality. Electronically Signed   By: Esperanza Heir M.D.   On: 09/02/2015 14:37   Mr Brain Wo Contrast  09/02/2015  CLINICAL DATA:  Dizziness since yesterday evening. Assessment for posterior circulation stroke. EXAM: MRI HEAD WITHOUT CONTRAST TECHNIQUE: Multiplanar, multiecho pulse sequences of the brain and surrounding structures were  obtained without intravenous contrast. COMPARISON:  Head CT 09/02/2015 FINDINGS: Limited visualization of the upper cervical spine demonstrates a thin band of curvilinear hypointensity centrally in the visualized cervical spinal cord on the sagittal T1 sequence (series 3, image 12). The axial sequences do not extend through this level, and the upper cervical spinal cord is only  visualized on a single coronal T2 image without abnormality identified. The cerebellar tonsils are normally positioned. There is no evidence of acute infarct, mass, midline shift, or extra-axial fluid collection. A chronic microhemorrhage is noted along the medial margin of the left caudate head. T2 hyperintensities in the subcortical and periventricular cerebral white matter bilaterally are nonspecific but compatible with moderate chronic small vessel ischemic disease. There is mild cerebral atrophy. Orbits are unremarkable. Small bilateral maxillary sinus mucous retention cysts and a small right mastoid effusion are noted. The distal right vertebral artery flow void is abnormal in appearance. Other major intracranial vascular flow voids are preserved. IMPRESSION: 1. No acute infarct. 2. Moderate chronic small vessel ischemic disease. 3. Abnormal distal right vertebral artery which may reflect diminished flow from a proximal stenosis or occlusion. 4. Small syrinx versus artifact in the upper cervical spinal cord. Cervical spine MRI suggested for further evaluation. Electronically Signed   By: Sebastian Ache M.D.   On: 09/02/2015 20:07   Ct Renal Stone Study  09/02/2015  CLINICAL DATA:  Diffuse abdominal pain. Dizziness. Vomiting. Left flank pain. EXAM: CT ABDOMEN AND PELVIS WITHOUT CONTRAST TECHNIQUE: Multidetector CT imaging of the abdomen and pelvis was performed following the standard protocol without IV contrast. COMPARISON:  None. FINDINGS: Lower chest: There is a small hiatal hernia. Lung bases are clear. Heart size is normal. Hepatobiliary: 2 cm cyst in the right lobe of the liver adjacent to the gallbladder fossa. Gallbladder has been removed. No biliary tree dilatation. Pancreas: Normal. Spleen: Normal. Adrenals/Urinary Tract: Adrenal glands are normal. No renal calculi. 15 mm low-density lesion on the tip of the lower pole of the right kidney, most likely a cyst. Cortical irregularity of the lower pole  possibly from previous infarct. There is a 9 mm low-density area in the right upper pole and a 10 mm low-density in the left upper pole. These are not well enough seen characterization. New the ureters and bladder appear normal. Stomach/Bowel: Small hiatal hernia. Otherwise normal including the terminal ileum and appendix. Vascular/Lymphatic: Extensive atherosclerotic disease of the aorta and iliac arteries and common femoral arteries. No adenopathy. Reproductive: Normal. Other: No free air or free fluid. Musculoskeletal: No acute abnormality. Chronic fusion of the sacroiliac joints. IMPRESSION: Small hiatal hernia. Otherwise benign appearing abdomen. Small low-density areas in the upper poles of both kidneys are nonspecific the lower pole cyst on the right was visible are abdominal ultrasound dated 10/15/2004. Renal ultrasound on an elective basis could be used for further characterization. Electronically Signed   By: Francene Boyers M.D.   On: 09/02/2015 14:41       Assessment / Plan:    #1 65 yo male admitted with dizziness /lightheadedness/vision changes- MRI of head - no CVA but has abnormal distal right vertebral artery ?proximal stenosis or occlusion Also,has possible syrinx upper cervical spinal cord   Pt needs further neurovascular evaluation.  #2 intermittent nausea/vomiting/epigastric pain- stable, and not cause for admit - will do EGD when neuro vascular workup done and cleared  Continue PPI and prn antiemetics    Active Problems:   GERD (gastroesophageal reflux disease)   Systolic CHF (HCC)  Nonischemic cardiomyopathy (HCC)   Chronic renal insufficiency, stage III (moderate)   Vertigo     Amy Esterwood  09/04/2015, 11:03 AM  GI ATTENDING  Interval history data reviewed. Patient seen and examined. Patient is quite stable from a GI standpoint. No problems with nausea or vomiting. Eating without difficulty. No complaints of abdominal pain. Benign abdominal exam. Suspect that some  of his GI complaints are GERD. Agree with chronic PPI therapy. Primary service to workup neurologic complaints imaging abnormalities. Currently no plans for endoscopy. GI will sign off at this point.   Wilhemina Bonito. Eda Keys., M.D. [facility] Division of Gastroenterology

## 2015-09-04 NOTE — Progress Notes (Signed)
PROGRESS NOTE    Thomas Bullock  VHQ:[id number] DOB: 06/17/1950 DOA: 09/02/2015 PCP: No PCP Per Patient  Outpatient Specialists: Dr. Daphene Jaeger ( cardiology)    Brief Narrative:  Thomas Bullock is a 65 y.o. male with medical history significant of nonischemic cardiomyopathy (previously with low EF-but last echo in 2015 with normalized EF), hypertension who presented to the ED with vertigo, vomiting since day prior to admission. Patient is a poor historian for timeline, but he now denies that he ever had dizziness or vertigo, but instead has diplopia.  His diplopia is binocular, has waxed and waned and has caused him to have an unsteady gait.  He has normal intraocular movements.  PT has evaluated him and noted that his gait is unsteady but improves when he covers one eye.  He had an MRI of the brain that did not reveal any specific lesion to explain these symptoms.  He had a TSH checked which was normal.  A1C was also checked and is pending at time of this note.  He further had some GI symptoms with frequent indigestion and vomiting, particularly after lying flat.  GI was consulted but did not think EGD was imminently necessary.  With sitting up, both his diplopia and GI symptoms improved.  Finally, he had some elevated BP which seemed to make the diplopia worse.  He was started on his home blood pressure medications including spironolactone and this improved.  Possibly somewhat intermittent compliance to BP medications could be contributing.     Assessment & Plan:   GERD with hematemesis - DDx includes mucosal injury due to retching, gastritis, PUD.   - Advance diet, change to PO PPI - GI consult, no imminent need for EGD - Zofran for nausea - CBC trended and stable - Consider GI work up as outpatient.   Diplopia - Initially thought to be vertigo, but now clearly diplopia with blurry vision - PT evaluation recommends walker, order placed, SW will need to be contacted to obtain on  day of discharge - TSH normal, A1C pending, HIV pending - MRI brain did not show any obvious cause, CN involved with eye movement appear intact as EOMI are not impaired, pupillary reflex normal - Good control of BP - Will need ophthalmology referral once discharged.   AKI on Chronic renal insufficiency, stage III  - Renal function improved with fluids, close to baseline of 1.3-1.4 - Advance diet to heart healthy - BMET in the AM  HTN - Resumed home medications of coreg, hydralazine, imdur, spironolactone - Holding lisinopril  DVT prophylaxis: Heparin SQ  Code Status: Full   Disposition Plan: Can likely discharge tomorrow with walker and PT   Consultants:   GI  PT  Procedures:   none  Antimicrobials:   none    Subjective: Feeling better, diplopia improved but still there, blurry vision which is chronic.  Eating full liquids well.   Objective: Filed Vitals:   09/03/15 1502 09/03/15 2120 09/04/15 0059 09/04/15 0548  BP: 155/90 126/73 121/77 119/76  Pulse: 56 50 52 52  Temp: 98.7 F (37.1 C) 97.4 F (36.3 C)  97.6 F (36.4 C)  TempSrc:    Oral  Resp: Height:      Weight:      SpO2: 100% 99% 100% 100%    Intake/Output Summary (Last 24 hours) at 09/04/15 4132 Last data filed at 09/03/15 2319  Gross per 24 hour  Intake  0 ml  Output    975 ml  Net   -975 ml   Filed Weights   09/02/15 2017  Weight: 168 lb 11.2 oz (76.522 kg)    Examination:  General exam: Appears calm and comfortable Eyes: EOMI, nystagmus still present Respiratory system: Clear to auscultation. Respiratory effort normal. Cardiovascular system: S1 & S2 heard, RR, NR. No murmurs Gastrointestinal system: Abdomen is nondistended, soft and nontender.  Normal bowel sounds heard. Central nervous system: Alert and oriented. No focal neurological deficits. Extremities: Symmetric 5 x 5 power. Linear blackening to nails bilaterally Psychiatry: Judgement appears normal and  insight decreased, likely due to low health literacy. Mood & affect appropriate.     Data Reviewed: I have personally reviewed following labs and imaging studies  CBC:  Recent Labs Lab 09/02/15 1206 09/03/15 0236 09/04/15 0715  WBC 7.2 6.9 5.5  HGB 15.0 14.5 13.4  HCT 43.0 42.1 39.6  MCV 77.2* 76.1* 78.0  PLT 241 209 189   Basic Metabolic Panel:  Recent Labs Lab 09/02/15 1206 09/02/15 1515 09/03/15 0236 09/04/15 0715  NA 139 142 144 138  K 3.6 3.7 3.5 3.6  CL 111 110 114* 108  CO2 14* 19* 18* 22  GLUCOSE 117* 94 89 94  BUN 17 15 15 11   CREATININE 1.58* 1.54* 1.61* 1.56*  CALCIUM 10.2 9.9 9.7 9.3   GFR: Estimated Creatinine Clearance: 44.7 mL/min (by C-G formula based on Cr of 1.56). Liver Function Tests:  Recent Labs Lab 09/02/15 1206  AST 44*  ALT 34  ALKPHOS 75  BILITOT 1.0  PROT 7.2  ALBUMIN 3.6    Recent Labs Lab 09/02/15 1325  LIPASE 61*   Urine analysis:    Component Value Date/Time   COLORURINE AMBER* 09/02/2015 1330   APPEARANCEUR CLEAR 09/02/2015 1330   LABSPEC 1.021 09/02/2015 1330   PHURINE 7.0 09/02/2015 1330   GLUCOSEU NEGATIVE 09/02/2015 1330   HGBUR NEGATIVE 09/02/2015 1330   BILIRUBINUR SMALL* 09/02/2015 1330   KETONESUR 15* 09/02/2015 1330   PROTEINUR NEGATIVE 09/02/2015 1330   UROBILINOGEN 2.0* 08/07/2013 1619   NITRITE NEGATIVE 09/02/2015 1330   LEUKOCYTESUR TRACE* 09/02/2015 1330      Radiology Studies: Dg Chest 2 View  09/02/2015  CLINICAL DATA:  Dizziness and left-sided chest pain for 2 days. Elevated blood pressure. Episode of vomiting. EXAM: CHEST  2 VIEW COMPARISON:  08/07/2013 FINDINGS: The cardiomediastinal contours are normal. The lungs are clear. Pulmonary vasculature is normal. No consolidation, pleural effusion, or pneumothorax. No acute osseous abnormalities are seen. Surgical clips in the right upper quadrant of the abdomen. IMPRESSION: No acute pulmonary process. Electronically Signed   By: Rubye Oaks  M.D.   On: 09/02/2015 12:32   Ct Head Wo Contrast  09/02/2015  CLINICAL DATA:  Dizzy since waking this morning; history of hypertension EXAM: CT HEAD WITHOUT CONTRAST TECHNIQUE: Contiguous axial images were obtained from the base of the skull through the vertex without intravenous contrast. COMPARISON:  08/29/2007 FINDINGS: Moderate diffuse atrophy as seen on the prior study. Mild low attenuation in the deep white matter consistent with chronic small vessel change. No mass or infarct. No hemorrhage or extra-axial fluid. No hydrocephalus. Small mucous retention cysts or polyps in both maxillary sinuses. Minimal mucosal thickening ethmoid air cells bilaterally. Small polyps or retention cysts in the sphenoid sinuses. Frontal sinuses are clear. Posterior right mastoid air cells are opacified. IMPRESSION: Chronic inflammatory change in the sinuses. Age-related involutional change involving the brain with no acute intracranial  abnormality. Electronically Signed   By: Esperanza Heir M.D.   On: 09/02/2015 14:37   Mr Brain Wo Contrast  09/02/2015  CLINICAL DATA:  Dizziness since yesterday evening. Assessment for posterior circulation stroke. EXAM: MRI HEAD WITHOUT CONTRAST TECHNIQUE: Multiplanar, multiecho pulse sequences of the brain and surrounding structures were obtained without intravenous contrast. COMPARISON:  Head CT 09/02/2015 FINDINGS: Limited visualization of the upper cervical spine demonstrates a thin band of curvilinear hypointensity centrally in the visualized cervical spinal cord on the sagittal T1 sequence (series 3, image 12). The axial sequences do not extend through this level, and the upper cervical spinal cord is only visualized on a single coronal T2 image without abnormality identified. The cerebellar tonsils are normally positioned. There is no evidence of acute infarct, mass, midline shift, or extra-axial fluid collection. A chronic microhemorrhage is noted along the medial margin of the  left caudate head. T2 hyperintensities in the subcortical and periventricular cerebral white matter bilaterally are nonspecific but compatible with moderate chronic small vessel ischemic disease. There is mild cerebral atrophy. Orbits are unremarkable. Small bilateral maxillary sinus mucous retention cysts and a small right mastoid effusion are noted. The distal right vertebral artery flow void is abnormal in appearance. Other major intracranial vascular flow voids are preserved. IMPRESSION: 1. No acute infarct. 2. Moderate chronic small vessel ischemic disease. 3. Abnormal distal right vertebral artery which may reflect diminished flow from a proximal stenosis or occlusion. 4. Small syrinx versus artifact in the upper cervical spinal cord. Cervical spine MRI suggested for further evaluation. Electronically Signed   By: Sebastian Ache M.D.   On: 09/02/2015 20:07   Ct Renal Stone Study  09/02/2015  CLINICAL DATA:  Diffuse abdominal pain. Dizziness. Vomiting. Left flank pain. EXAM: CT ABDOMEN AND PELVIS WITHOUT CONTRAST TECHNIQUE: Multidetector CT imaging of the abdomen and pelvis was performed following the standard protocol without IV contrast. COMPARISON:  None. FINDINGS: Lower chest: There is a small hiatal hernia. Lung bases are clear. Heart size is normal. Hepatobiliary: 2 cm cyst in the right lobe of the liver adjacent to the gallbladder fossa. Gallbladder has been removed. No biliary tree dilatation. Pancreas: Normal. Spleen: Normal. Adrenals/Urinary Tract: Adrenal glands are normal. No renal calculi. 15 mm low-density lesion on the tip of the lower pole of the right kidney, most likely a cyst. Cortical irregularity of the lower pole possibly from previous infarct. There is a 9 mm low-density area in the right upper pole and a 10 mm low-density in the left upper pole. These are not well enough seen characterization. New the ureters and bladder appear normal. Stomach/Bowel: Small hiatal hernia. Otherwise  normal including the terminal ileum and appendix. Vascular/Lymphatic: Extensive atherosclerotic disease of the aorta and iliac arteries and common femoral arteries. No adenopathy. Reproductive: Normal. Other: No free air or free fluid. Musculoskeletal: No acute abnormality. Chronic fusion of the sacroiliac joints. IMPRESSION: Small hiatal hernia. Otherwise benign appearing abdomen. Small low-density areas in the upper poles of both kidneys are nonspecific the lower pole cyst on the right was visible are abdominal ultrasound dated 10/15/2004. Renal ultrasound on an elective basis could be used for further characterization. Electronically Signed   By: Francene Boyers M.D.   On: 09/02/2015 14:41     Scheduled Meds: . antiseptic oral rinse  7 mL Mouth Rinse q12n4p  . aspirin EC  325 mg Oral Daily  . carvedilol  12.5 mg Oral BID WC  . chlorhexidine  15 mL Mouth Rinse BID  .  digoxin  62.5 mcg Oral Daily  . furosemide  40 mg Oral Daily  . heparin  5,000 Units Subcutaneous Q8H  . hydrALAZINE  25 mg Oral Q8H  . isosorbide dinitrate  20 mg Oral TID  . ondansetron (ZOFRAN) IV  4 mg Intravenous Q6H   Or  . ondansetron  4 mg Oral Q6H  . pantoprazole (PROTONIX) IV  40 mg Intravenous Q12H  . sodium chloride flush  3 mL Intravenous Q12H  . spironolactone  12.5 mg Oral BID   Continuous Infusions:      Time spent: 30 minutes    Debe Coder, MD [facility] Pager [phone]  If 7PM-7AM, please contact night-coverage [email or web address] Password [facility] 09/04/2015, 9:27 AM

## 2015-09-04 NOTE — Progress Notes (Signed)
Pt with PVCs on tele. Pt asymptomatic. Provider made aware. No further orders given. Will continue to monitor pt.

## 2015-09-05 ENCOUNTER — Observation Stay (HOSPITAL_BASED_OUTPATIENT_CLINIC_OR_DEPARTMENT_OTHER): Payer: Medicare Other

## 2015-09-05 DIAGNOSIS — G459 Transient cerebral ischemic attack, unspecified: Secondary | ICD-10-CM | POA: Diagnosis not present

## 2015-09-05 DIAGNOSIS — H532 Diplopia: Secondary | ICD-10-CM

## 2015-09-05 DIAGNOSIS — N189 Chronic kidney disease, unspecified: Secondary | ICD-10-CM | POA: Diagnosis not present

## 2015-09-05 LAB — RAPID URINE DRUG SCREEN, HOSP PERFORMED
AMPHETAMINES: NOT DETECTED
Barbiturates: NOT DETECTED
Benzodiazepines: NOT DETECTED
COCAINE: NOT DETECTED
OPIATES: NOT DETECTED
TETRAHYDROCANNABINOL: NOT DETECTED

## 2015-09-05 LAB — HEMOGLOBIN A1C
HEMOGLOBIN A1C: 5.4 % (ref 4.8–5.6)
Mean Plasma Glucose: 108 mg/dL

## 2015-09-05 LAB — HIV ANTIBODY (ROUTINE TESTING W REFLEX): HIV SCREEN 4TH GENERATION: NONREACTIVE

## 2015-09-05 MED ORDER — ATORVASTATIN CALCIUM 20 MG PO TABS
20.0000 mg | ORAL_TABLET | Freq: Every day | ORAL | Status: DC
  Administered 2015-09-05 – 2015-09-06 (×2): 20 mg via ORAL
  Filled 2015-09-05 (×2): qty 1

## 2015-09-05 MED ORDER — PANTOPRAZOLE SODIUM 40 MG PO TBEC
40.0000 mg | DELAYED_RELEASE_TABLET | Freq: Two times a day (BID) | ORAL | Status: DC
Start: 2015-09-05 — End: 2015-09-06
  Administered 2015-09-05 – 2015-09-06 (×3): 40 mg via ORAL
  Filled 2015-09-05 (×3): qty 1

## 2015-09-05 MED ORDER — STROKE: EARLY STAGES OF RECOVERY BOOK
Freq: Once | Status: AC
  Administered 2015-09-05: 13:00:00
  Filled 2015-09-05: qty 1

## 2015-09-05 NOTE — Progress Notes (Signed)
PROGRESS NOTE  SIRIS GRAUS OHF:[id number] DOB: 12-04-1950 DOA: 09/02/2015 PCP: No PCP Per Patient Lennette Bihari, MD (Cardiology) prescribes medications.  Brief Narrative: 65 year old man presented with vertigo, vomiting, dizziness, feeling of being off balance and unsteady gait 4/30. Patient reported ongoing vomiting after meals, epigastric pain and indigestion. CT head and abdomen and pelvis negative. Because of persistent vomiting he was placed in observation. Gastroenterology consultation was obtained, ultimately no further evaluation was recommended as the patient's symptoms resolved. Currently tolerating a diet. No reported complaints. Regard to his vision, symptoms related to diplopia which has spontaneously resolved. No dizziness. Abnormal MRI discussed with neurology Dr. Cherylynn Ridges  Assessment/Plan: 1. Dizziness with vomiting, +/- diplopia, suspected TIA with vertebrobasilar insufficiency. Peripheral vertigo considered on admission, negative BPPV testing by physical therapy but did show evidence of vestibular hypofunction. CT head negative. MRI brain no acute infarct. Abnormal distal right vertebral artery seen which could reflect proximal stenosis or occlusion. EKG sinus rhythm, PVCs. Does consume alcohol, unclear how much. Discussed with neurology Dr. Cherylynn Ridges, suspect vertebrobasilar insufficiency and recommend TIA evaluation completion, aspirin and outpatient follow-up with neurology. 2. Diplopia. Resolved. Possibly related to hypertension versus TIA. Compliance is not clear. TSH within normal limits. Digoxin level was subtherapeutic. 3. Suspected Chronic kidney disease stage III; acute kidney injury considered on admission but BUN is normal and creatinine stable. Likely at baseline. ARB on hold.  4. GERD. Continue PPI. No evidence of hematemesis. 5. Hypertension. On carvedilol, hydralazine, Aldactone and benazepril as an outpatient. 6. Small low-density areas in the upper poles of  both kidneys are nonspecific the lower pole cyst on the right was visible are abdominal ultrasound dated 10/15/2004. Renal ultrasound on an elective basis could be used for further characterization.   Overall much improved today. Dizziness has resolved. As above, discussed with neurology, plan to complete TIA evaluation. Double vision has resolved. Near and far vision seem to be intact. Improvement coincides with improvement in blood pressure as well, suspect related to significant hypertension and possible noncompliance with blood pressure medications, he is on 4 agents at home. Consider outpatient ophthalmology evaluation of the symptoms currently resolved.  Check echocardiogram, bilateral carotid Dopplers, lipid panel and hemoglobin A1c. Continue aspirin. Add statin.   Much improved with physical therapy today, ambulate 350 feet with no assistance device. Mildly unsteady. Home health physical therapy recommended.  No nausea or vomiting. There is no dictation in the medical record of hematemesis but I do not see this documented elsewhere. He's been assessed by GI who recommended continuing PPI therapy and no further inpatient workup. Can follow-up as an outpatient.  DVT prophylaxis: heparin Code Status: full code Family Communication: none Disposition Plan: Anticipate home with home health next 24 hours  Brendia Sacks, MD  [facility] Direct contact:  --Via amion app OR  --[email or web address]; password [email or web address] and click  7PM-7AM contact night coverage as above 09/05/2015, 1:23 PM    Consultants:  Gastroenterology  PT: Outpatient recommended  Procedures:    Antimicrobials:    HPI/Subjective: Feels better. No nausea or vomiting. Tolerating diet. No diplopia today or yesterday. Reading glasses at bedside, able to read without difficulty. Sees text of knees fever very well. Looking at the wall and the white board he has no double vision. No neurologic complaints. Physical therapy  reassess today, improved balance noted, home health physical therapy recommended   Objective: Filed Vitals:   09/05/15 0500 09/05/15 0501 09/05/15 0521 09/05/15 0908  BP: 102/72 96/71 111/79 125/86  Pulse: 57  70  61  Temp:      TempSrc:      Resp:      Height:      Weight:      SpO2:        Intake/Output Summary (Last 24 hours) at 09/05/15 1323 Last data filed at 09/05/15 0932  Gross per 24 hour  Intake    360 ml  Output    950 ml  Net   -590 ml     Filed Weights   09/02/15 2017  Weight: 76.522 kg (168 lb 11.2 oz)    Exam:    Constitutional:  . Appears calm and comfortable Eyes:  . PERRL and irises appear normal ENMT:  . grossly normal hearing  Respiratory:  . CTA bilaterally, no w/r/r.  . Respiratory effort normal. No retractions or accessory muscle use Cardiovascular:  . RRR, no m/r/g . No LE extremity edema   . Telemetry SR Musculoskeletal:  . RUE, LUE o strength and tone normal o No pass pointing Neurologic:  . CN 2-12 intact Psychiatric:  . Mental status o Mood, affect appropriate  I have personally reviewed following labs and imaging studies:  Creatinine without significant change, 1.56. BUN is normal.  CBC unremarkable  Serum digoxin was subtherapeutic  TSH within normal limits  Scheduled Meds: . antiseptic oral rinse  7 mL Mouth Rinse q12n4p  . aspirin EC  325 mg Oral Daily  . atorvastatin  20 mg Oral q1800  . carvedilol  12.5 mg Oral BID WC  . chlorhexidine  15 mL Mouth Rinse BID  . digoxin  62.5 mcg Oral Daily  . furosemide  40 mg Oral Daily  . heparin  5,000 Units Subcutaneous Q8H  . hydrALAZINE  25 mg Oral Q8H  . isosorbide dinitrate  20 mg Oral TID  . ondansetron (ZOFRAN) IV  4 mg Intravenous Q6H   Or  . ondansetron  4 mg Oral Q6H  . pantoprazole  40 mg Oral BID AC  . sodium chloride flush  3 mL Intravenous Q12H  . spironolactone  12.5 mg Oral BID   Continuous Infusions:   Principal Problem:   TIA (transient ischemic  attack) Active Problems:   GERD (gastroesophageal reflux disease)   Hypertension   Systolic CHF (HCC)   Nonischemic cardiomyopathy (HCC)   Chronic renal insufficiency, stage III (moderate)   Vertigo   Diplopia

## 2015-09-05 NOTE — Progress Notes (Signed)
*  PRELIMINARY RESULTS* Vascular Ultrasound Carotid Duplex (Doppler) has been completed.   There is no obvious evidence of hemodynamically significant internal carotid artery stenosis bilaterally. Unable to detect flow in the right vertebral artery by color and spectral Doppler, suggestive of possible occlusion. The left vertebral artery is patent with antegrade flow.  09/05/2015 3:11 PM Gertie Fey, RVT, RDCS, RDMS

## 2015-09-05 NOTE — Progress Notes (Signed)
Physical Therapy Treatment Patient Details Name: Thomas Bullock MRN: [id number] DOB: 20-Oct-1950 Today's Date: 09/05/2015    History of Present Illness Thomas Bullock is a 64 y.o. male with medical history significant of nonischemic cardiomyopathy (previously with low EF-but last echo in 2015 with normalized EF), hypertension who presented to the ED with vertigo, vomiting since yesterday evening. Patient is a poor historian-but apparently since yesterday evening he started developing dizziness and occasional lightheadedness. He claims his gait was unsteady and he had to hold onto objects to get around. He subsequently started having vomiting-nonbloody/nonbilious today.     PT Comments    Pt with improved vision, c/o blurry but no more double vision. Pt with improved balance but con't to present with fall risk as demo'd by score of 18 on DGI. Recommend con't PT to address higher level balance activities.  Follow Up Recommendations  Home health PT;Supervision/Assistance - 24 hour ,    Equipment Recommendations  None recommended by PT    Recommendations for Other Services       Precautions / Restrictions Precautions Precautions: Fall Restrictions Weight Bearing Restrictions: No    Mobility  Bed Mobility Overal bed mobility: Modified Independent                Transfers Overall transfer level: Modified independent   Transfers: Sit to/from Stand Sit to Stand: Modified independent (Device/Increase time)            Ambulation/Gait Ambulation/Gait assistance: Min guard Ambulation Distance (Feet): 350 Feet Assistive device: None Gait Pattern/deviations: Step-through pattern Gait velocity: wfl   General Gait Details: much improved compared to saturday, no overt LOB however remains mildly unsteady as noted with lateral sway   Stairs            Wheelchair Mobility    Modified Rankin (Stroke Patients Only)       Balance                                    Cognition Arousal/Alertness: Awake/alert Behavior During Therapy: WFL for tasks assessed/performed Overall Cognitive Status: Within Functional Limits for tasks assessed                      Exercises      General Comments General comments (skin integrity, edema, etc.): Vestibular assessment: pt with minimal end gaze nystagmus both to L and R. Pt denies double vision and was able to increase speed and duration during VORx1 exercises      Pertinent Vitals/Pain Pain Assessment: No/denies pain    Home Living                      Prior Function            PT Goals (current goals can now be found in the care plan section) Progress towards PT goals: Progressing toward goals    Frequency  Min 3X/week    PT Plan Discharge plan needs to be updated    Co-evaluation             End of Session Equipment Utilized During Treatment: Gait belt Activity Tolerance: Patient tolerated treatment well Patient left: in chair;with call bell/phone within reach     Time: 0951-1010 PT Time Calculation (min) (ACUTE ONLY): 19 min  Charges:  $Gait Training: 8-22 mins  G CodesMarcene Brawn 09/05/2015, 10:52 AM   Lewis Shock, PT, DPT Pager #: [phone] Office #: [phone]

## 2015-09-06 ENCOUNTER — Observation Stay (HOSPITAL_BASED_OUTPATIENT_CLINIC_OR_DEPARTMENT_OTHER): Payer: Medicare Other

## 2015-09-06 DIAGNOSIS — N189 Chronic kidney disease, unspecified: Secondary | ICD-10-CM

## 2015-09-06 DIAGNOSIS — G459 Transient cerebral ischemic attack, unspecified: Secondary | ICD-10-CM

## 2015-09-06 DIAGNOSIS — H532 Diplopia: Secondary | ICD-10-CM

## 2015-09-06 DIAGNOSIS — K219 Gastro-esophageal reflux disease without esophagitis: Secondary | ICD-10-CM | POA: Diagnosis not present

## 2015-09-06 LAB — ECHOCARDIOGRAM COMPLETE
Height: 67 in
WEIGHTICAEL: 2699.2 [oz_av]

## 2015-09-06 LAB — LIPID PANEL
CHOL/HDL RATIO: 4.6 ratio
CHOLESTEROL: 153 mg/dL (ref 0–200)
HDL: 33 mg/dL — AB (ref >40)
LDL Cholesterol: 96 mg/dL (ref 0–99)
Triglycerides: 122 mg/dL (ref <150)
VLDL: 24 mg/dL (ref 0–40)

## 2015-09-06 MED ORDER — ASPIRIN 325 MG PO TBEC: 325.0000 mg | DELAYED_RELEASE_TABLET | Freq: Every day | ORAL | Status: DC

## 2015-09-06 MED ORDER — ATORVASTATIN CALCIUM 20 MG PO TABS: 20.0000 mg | ORAL_TABLET | Freq: Every day | ORAL | Status: DC

## 2015-09-06 MED ORDER — POLYVINYL ALCOHOL 1.4 % OP SOLN
1.0000 [drp] | OPHTHALMIC | Status: DC | PRN
  Administered 2015-09-06 (×2): 1 [drp] via OPHTHALMIC
  Filled 2015-09-06: qty 15

## 2015-09-06 NOTE — Care Management Note (Signed)
Case Management Note  Patient Details  Name: Thomas Bullock MRN: [id number] Date of Birth: 22-Mar-1951  Subjective/Objective:             Presents with vertigo/TIA.       Action/Plan: Discharge to home with home health services (PT).  Expected Discharge Date:    09/06/2015              Expected Discharge Plan:  Home/Self Care  In-House Referral:   [facility]/PCP  Discharge planning Services  CM Consult, [facility], Follow-up appointment scheduled  Post Acute Care Choice:    Choice offered to:  Patient  DME Arranged:  Walker rolling DME Agency:  [facility]  HH Arranged:  PT [facility] Agency:  [facility]  Status of Service:  Completed, signed off  Medicare Important Message Given:    Date Medicare IM Given:    Medicare IM give by:    Date Additional Medicare IM Given:    Additional Medicare Important Message give by:     If discussed at Long Length of Stay Meetings, dates discussed:    Additional Comments: CM made referral with [facility] @ [phone] for home health PT. [facility] to f/u with pt within a 24-48  period post discharge to home.   Gae Gallop [place], [place] [phone] 09/06/2015, 11:22 AM

## 2015-09-06 NOTE — Evaluation (Signed)
Occupational Therapy Evaluation  Patient Details Name: Thomas Bullock MRN: [id number] DOB: 19-Apr-1951 Today's Date: 09/06/2015    History of Present Illness Thomas Bullock is a 65 y.o. male with medical history significant of nonischemic cardiomyopathy (previously with low EF-but last echo in 2015 with normalized EF), hypertension who presented to the ED with vertigo, vomiting since yesterday evening. Patient is a poor historian-but apparently since yesterday evening he started developing dizziness and occasional lightheadedness. He claims his gait was unsteady and he had to hold onto objects to get around. He subsequently started having vomiting-nonbloody/nonbilious today.    Clinical Impression   Pt was independent prior to admission. Presents with impaired standing balance and unsteady gait requiring supervision for safety while walking in halls. Pt felt better with hand held assist. He stated he plans to purchase a cane in the community for ambulation outside of his home. Pt also with decreased visual acuity, but did not demonstrate diplopia, oculomotor or visual field deficits. Pt has never been seen by an eye doctor. No further OT needs.    Follow Up Recommendations  No OT follow up    Equipment Recommendations  None recommended by OT    Recommendations for Other Services       Precautions / Restrictions Precautions Precautions: Fall Restrictions Weight Bearing Restrictions: No      Mobility Bed Mobility                  Transfers Overall transfer level: Modified independent     Sit to Stand: Modified independent (Device/Increase time)         General transfer comment: no assist, supervision for safety    Balance     Sitting balance-Leahy Scale: Good       Standing balance-Leahy Scale: Fair                              ADL                                         General ADL Comments: Pt supervised for  mobility, otherwise independent. Walked unit without LOB with no device.      Vision Additional Comments: Pt with decreased acuity for distance and near vision. Pt has not had a vision exam in his lifetime.   Perception     Praxis      Pertinent Vitals/Pain Pain Assessment: No/denies pain     Hand Dominance Right   Extremity/Trunk Assessment Upper Extremity Assessment Upper Extremity Assessment: Overall WFL for tasks assessed   Lower Extremity Assessment Lower Extremity Assessment: Overall WFL for tasks assessed   Cervical / Trunk Assessment Cervical / Trunk Assessment: Normal   Communication Communication Communication: No difficulties   Cognition Arousal/Alertness: Awake/alert Behavior During Therapy: WFL for tasks assessed/performed Overall Cognitive Status: Within Functional Limits for tasks assessed, pt is not literate.                     General Comments       Exercises       Shoulder Instructions      Home Living Family/patient expects to be discharged to:: Private residence Living Arrangements: Spouse/significant other Available Help at Discharge: Family;Available 24 hours/day Type of Home: Apartment Home Access: Stairs to enter Entrance Stairs-Number of Steps: flight2nd floor  Entrance Stairs-Rails: Right Home Layout: One level     Bathroom Shower/Tub: Chief Strategy Officer: Standard     Home Equipment: None          Prior Functioning/Environment Level of Independence: Independent             OT Diagnosis:  (impaired balance)   OT Problem List:     OT Treatment/Interventions:      OT Goals(Current goals can be found in the care plan section) Acute Rehab OT Goals Patient Stated Goal: go home  OT Frequency:     Barriers to D/C:            Co-evaluation              End of Session Equipment Utilized During Treatment: Gait  belt  Activity Tolerance: Patient tolerated treatment well Patient left: in chair;with call bell/phone within reach;with chair alarm set   Time: 1610-9604 OT Time Calculation (min): 25 min Charges:  OT General Charges $OT Visit: 1 Procedure OT Evaluation $OT Eval Low Complexity: 1 Procedure OT Treatments $Self Care/Home Management : 8-22 mins G-Codes: OT G-codes **NOT FOR INPATIENT CLASS** Functional Assessment Tool Used: clinical judgement Functional Limitation: Self care Self Care Current Status (V4098): At least 1 percent but less than 20 percent impaired, limited or restricted Self Care Goal Status (J1914): At least 1 percent but less than 20 percent impaired, limited or restricted Self Care Discharge Status [phone]): At least 1 percent but less than 20 percent impaired, limited or restricted  Evern Bio 09/06/2015, 2:58 PM  [phone]

## 2015-09-06 NOTE — Progress Notes (Signed)
  Echocardiogram 2D Echocardiogram has been performed.  Thomas Bullock 09/06/2015, 12:20 PM

## 2015-09-06 NOTE — Discharge Instructions (Signed)
Follow with [facility] in one week  Get CBC, CMP,  checked  by Primary MD next visit.    Activity: As tolerated with Full fall precautions use walker/cane & assistance as needed   Disposition Home    Diet: Heart Healthy  , with feeding assistance and aspiration precautions.  For Heart failure patients - Check your Weight same time everyday, if you gain over 2 pounds, or you develop in leg swelling, experience more shortness of breath or chest pain, call your Primary MD immediately. Follow Cardiac Low Salt Diet and 1.5 lit/day fluid restriction.   On your next visit with your primary care physician please Get Medicines reviewed and adjusted.   Please request your Prim.MD to go over all Hospital Tests and Procedure/Radiological results at the follow up, please get all Hospital records sent to your Prim MD by signing hospital release before you go home.   If you experience worsening of your admission symptoms, develop shortness of breath, life threatening emergency, suicidal or homicidal thoughts you must seek medical attention immediately by calling 911 or calling your MD immediately  if symptoms less severe.  You Must read complete instructions/literature along with all the possible adverse reactions/side effects for all the Medicines you take and that have been prescribed to you. Take any new Medicines after you have completely understood and accpet all the possible adverse reactions/side effects.   Do not drive, operating heavy machinery, perform activities at heights, swimming or participation in water activities or provide baby sitting services if your were admitted for syncope or siezures until you have seen by Primary MD or a Neurologist and advised to do so again.  Do not drive when taking Pain medications.    Do not take more than prescribed Pain, Sleep and Anxiety Medications  Special Instructions: If you have smoked or chewed Tobacco  in the last 2 yrs please stop  smoking, stop any regular Alcohol  and or any Recreational drug use.  Wear Seat belts while driving.   Please note  You were cared for by a hospitalist during your hospital stay. If you have any questions about your discharge medications or the care you received while you were in the hospital after you are discharged, you can call the unit and asked to speak with the hospitalist on call if the hospitalist that took care of you is not available. Once you are discharged, your primary care physician will handle any further medical issues. Please note that NO REFILLS for any discharge medications will be authorized once you are discharged, as it is imperative that you return to your primary care physician (or establish a relationship with a primary care physician if you do not have one) for your aftercare needs so that they can reassess your need for medications and monitor your lab values.

## 2015-09-06 NOTE — Discharge Summary (Addendum)
Thomas Bullock, is a 65 y.o. male  DOB 1950-12-16  MRN [id number].  Admission date:  09/02/2015  Admitting Physician  Leroy Sea, MD  Discharge Date:  09/06/2015   Primary MD  No PCP Per Patient  Recommendations for primary care physician for things to follow:  - Patient will need outpatient referral to ophthalmology - Renal ultrasound as an outpatient to evaluate small low density area in left upper pole of both kidneys   Admission Diagnosis  bp high    Discharge Diagnosis  bp high     Principal Problem:   TIA (transient ischemic attack) Active Problems:   GERD (gastroesophageal reflux disease)   Hypertension   Systolic CHF (HCC)   Nonischemic cardiomyopathy (HCC)   Chronic renal insufficiency, stage III (moderate)   Vertigo   Diplopia      Past Medical History  Diagnosis Date  . Hypertension   . Acute pulmonary edema (HCC) 10/04/2011  . LVH (left ventricular hypertrophy) 10/04/2011  . Systolic CHF (HCC) 11/07/2011    Hospitalized for flash pulmonary edema in June 2012 due to malignant HTN.   EF was 20-25% with severe global hypokinesis. left heart cath which revealed an LAD with 20% smooth narrowing but otherwise normal coronaries. On ace inhibitor, spironalactone, carvedilol. Diuresed in hospital.  Life Vest was ordered and fitted by [employer] to wear until September.     Marland Kitchen GERD (gastroesophageal reflux disease) 10/04/2011  . Coronary artery disease     Past Surgical History  Procedure Laterality Date  . Dental surgery    . Cholecystectomy, laparoscopic  2006  . Cholecystectomy    . Left heart catheterization with coronary angiogram N/A 10/05/2011    Procedure: LEFT HEART CATHETERIZATION WITH CORONARY ANGIOGRAM;  Surgeon: Lennette Bihari, MD;  Location: [facility] CATH LAB;  Service: Cardiovascular;  Laterality: N/A;  . Right heart catheterization  10/05/2011    Procedure: RIGHT HEART CATH;   Surgeon: Lennette Bihari, MD;  Location: [facility] CATH LAB;  Service: Cardiovascular;;       History of present illness and  Hospital Course:     Kindly see H&P for history of present illness and admission details, please review complete Labs, Consult reports and Test reports for all details in brief  HPI  from the history and physical done on the day of admission 09/02/2015  Thomas Bullock is a 65 y.o. male with medical history significant of nonischemic cardiomyopathy (previously with low EF-but last echo in 2015 with normalized EF), hypertension who presented to the ED with vertigo, vomiting since yesterday evening. Patient is a poor historian-but apparently since yesterday evening he started developing dizziness and occasional lightheadedness. He claims his gait was unsteady and he had to hold onto objects to get around. He subsequently started having vomiting-nonbloody/nonbilious today. He claims he vomited around 3 times. Because of these symptoms he presented to the ED for further evaluation and treatment.  He denies any headache, chest pain or shortness of breath. Denies any abdominal pain. Denies any  diarrhea. Denies any fever.   ED Course: In the ED, patient was given supportive care with Zofran and IV fluids. CT of the head was negative for acute abnormalities. CT of the abdomen renal stone study did not show any acute abnormalities. Because of persistent vomiting, I was asked to admit this patient for further evaluation and treatment.   Hospital Course   65 year old man presented with vertigo, vomiting, dizziness, feeling of being off balance and unsteady gait 4/30. Patient reported ongoing vomiting after meals, epigastric pain and indigestion. CT head and abdomen and pelvis negative. Because of persistent vomiting he was placed in observation. Gastroenterology consultation was obtained, ultimately no further evaluation was recommended as the patient's symptoms resolved. Currently  tolerating a diet. No reported complaints. Regard to his vision, symptoms related to diplopia which has spontaneously resolved. No dizziness.  MRI with no evidence of acute CVA, but significant for right vertebral artery poor circulation , it has been confirmed by carotid Doppler , Dr. Irene Limbo discussed finding with neurology Dr. 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 Peripheral vertigo considered on admission, negative BPPV testing by physical therapy but did show evidence of vestibular hypofunction. CT head negative. MRI brain no acute infarct. Abnormal distal right vertebral artery seen which could reflect proximal stenosis or occlusion. EKG sinus rhythm, PVCs. Does consume alcohol, unclear how much. Dr. Irene Limbo Discussed with neurology Dr. Cherylynn Ridges, suspect vertebrobasilar insufficiency and recommend TIA evaluation completion, started on full dose aspirin , as well on statin , no obvious hemodynamically significant internal carotid artery stenosis bilaterally, but right vertebral artery undetectable flow suggestive of occlusion, ambulatory neurology referral has been made. - Will need outpatient ophthalmology follow-up  Chronic kidney disease stage III - ARB on hold  Hypertension - Continue with Coreg, hydralazine, Aldactone, hold benazepril given CKD  GERD - Continue with PPI, no evidence of hematemesis  Small low-density areas in the upper poles of both kidneys are nonspecific the lower pole cyst on the right was visible are abdominal ultrasound dated 10/15/2004. Renal ultrasound on an elective basis could be used for further characterization  Discharge Condition: stable   Follow UP  Follow-up Information    Follow up with [facility].   Why:  rolling walker delivered to bedside   Contact information:   [address] [place] [address] [phone]       Follow up with [facility].   Why:  Home health PT arranged   Contact information:   [address] [place] [address] [phone]       Go to [facility].   Contact information:   [address] [place] [address] [phone]        Discharge Instructions  and  Discharge Medications         Discharge Instructions    Ambulatory referral to Neurology    Complete by:  As directed   An appointment is requested in approximately: 4 weeks     Discharge instructions    Complete by:  As directed   [facility] in [facility] week  Get CBC, CMP,  checked  by Primary MD next visit.    Activity: As tolerated with Full fall precautions use walker/cane & assistance as needed   Disposition Home    Diet: Heart Healthy  , with feeding assistance and aspiration precautions.  For Heart failure patients -  Check your Weight same time everyday, if you gain over 2 pounds, or you develop in leg swelling, experience more shortness of breath or chest pain, call your Primary MD immediately. Follow Cardiac Low Salt Diet and 1.5 lit/day fluid restriction.   On your next visit with your primary care physician please Get Medicines reviewed and adjusted.   Please request your Prim.MD to go over all Hospital Tests and Procedure/Radiological results at the follow up, please get all Hospital records sent to your Prim MD by signing hospital release before you go home.   If you experience worsening of your admission symptoms, develop shortness of breath, life threatening emergency, suicidal or homicidal thoughts you must seek medical attention immediately by calling 911 or calling your MD immediately  if symptoms less severe.  You Must read complete instructions/literature along with all the possible adverse reactions/side effects for all the Medicines you take and that have been prescribed to you. Take any new Medicines after you have completely  understood and accpet all the possible adverse reactions/side effects.   Do not drive, operating heavy machinery, perform activities at heights, swimming or participation in water activities or provide baby sitting services if your were admitted for syncope or siezures until you have seen by Primary MD or a Neurologist and advised to do so again.  Do not drive when taking Pain medications.    Do not take more than prescribed Pain, Sleep and Anxiety Medications  Special Instructions: If you have smoked or chewed Tobacco  in the last 2 yrs please stop smoking, stop any regular Alcohol  and or any Recreational drug use.  Wear Seat belts while driving.   Please note  You were cared for by a hospitalist during your hospital stay. If you have any questions about your discharge medications or the care you received while you were in the hospital after you are discharged, you can call the unit and asked to speak with the hospitalist on call if the hospitalist that took care of you is not available. Once you are discharged, your primary care physician will handle any further medical issues. Please note that NO REFILLS for any discharge medications will be authorized once you are discharged, as it is imperative that you return to your primary care physician (or establish a relationship with a primary care physician if you do not have one) for your aftercare needs so that they can reassess your need for medications and monitor your lab values.     Increase activity slowly    Complete by:  As directed             Medication List    STOP taking these medications        benazepril 40 MG tablet  Commonly known as:  LOTENSIN      TAKE these medications        aspirin 325 MG EC tablet  Take 1 tablet (325 mg total) by mouth daily.     atorvastatin 20 MG tablet  Commonly known as:  LIPITOR  Take 1 tablet (20 mg total) by mouth daily at 6 PM.     carvedilol 12.5 MG tablet  Commonly known as:   COREG  TAKE ONE TABLET BY MOUTH TWICE DAILY WITH A MEAL     digoxin 0.125 MG tablet  Commonly known as:  LANOXIN  TAKE ONE-HALF TABLET BY MOUTH ONCE DAILY     furosemide 40 MG tablet  Commonly known as:  LASIX  TAKE ONE  TABLET BY MOUTH ONCE DAILY     hydrALAZINE 25 MG tablet  Commonly known as:  APRESOLINE  TAKE ONE AND ONE-HALF TABLETS BY MOUTH THREE TIMES DAILY     isosorbide dinitrate 20 MG tablet  Commonly known as:  ISORDIL  TAKE ONE TABLET BY MOUTH THREE TIMES DAILY     pantoprazole 40 MG tablet  Commonly known as:  PROTONIX  Take 1 tablet (40 mg total) by mouth daily.     spironolactone 25 MG tablet  Commonly known as:  ALDACTONE  TAKE ONE-HALF TABLET BY MOUTH TWICE DAILY          Diet and Activity recommendation: See Discharge Instructions above   Consults obtained -  GI Discussed with the neurology via phone   Major procedures and Radiology Reports - PLEASE review detailed and final reports for all details, in brief -      Dg Chest 2 View  09/02/2015  CLINICAL DATA:  Dizziness and left-sided chest pain for 2 days. Elevated blood pressure. Episode of vomiting. EXAM: CHEST  2 VIEW COMPARISON:  08/07/2013 FINDINGS: The cardiomediastinal contours are normal. The lungs are clear. Pulmonary vasculature is normal. No consolidation, pleural effusion, or pneumothorax. No acute osseous abnormalities are seen. Surgical clips in the right upper quadrant of the abdomen. IMPRESSION: No acute pulmonary process. Electronically Signed   By: Rubye Oaks M.D.   On: 09/02/2015 12:32   Ct Head Wo Contrast  09/02/2015  CLINICAL DATA:  Dizzy since waking this morning; history of hypertension EXAM: CT HEAD WITHOUT CONTRAST TECHNIQUE: Contiguous axial images were obtained from the base of the skull through the vertex without intravenous contrast. COMPARISON:  08/29/2007 FINDINGS: Moderate diffuse atrophy as seen on the prior study. Mild low attenuation in the deep white matter  consistent with chronic small vessel change. No mass or infarct. No hemorrhage or extra-axial fluid. No hydrocephalus. Small mucous retention cysts or polyps in both maxillary sinuses. Minimal mucosal thickening ethmoid air cells bilaterally. Small polyps or retention cysts in the sphenoid sinuses. Frontal sinuses are clear. Posterior right mastoid air cells are opacified. IMPRESSION: Chronic inflammatory change in the sinuses. Age-related involutional change involving the brain with no acute intracranial abnormality. Electronically Signed   By: Esperanza Heir M.D.   On: 09/02/2015 14:37   Mr Brain Wo Contrast  09/02/2015  CLINICAL DATA:  Dizziness since yesterday evening. Assessment for posterior circulation stroke. EXAM: MRI HEAD WITHOUT CONTRAST TECHNIQUE: Multiplanar, multiecho pulse sequences of the brain and surrounding structures were obtained without intravenous contrast. COMPARISON:  Head CT 09/02/2015 FINDINGS: Limited visualization of the upper cervical spine demonstrates a thin band of curvilinear hypointensity centrally in the visualized cervical spinal cord on the sagittal T1 sequence (series 3, image 12). The axial sequences do not extend through this level, and the upper cervical spinal cord is only visualized on a single coronal T2 image without abnormality identified. The cerebellar tonsils are normally positioned. There is no evidence of acute infarct, mass, midline shift, or extra-axial fluid collection. A chronic microhemorrhage is noted along the medial margin of the left caudate head. T2 hyperintensities in the subcortical and periventricular cerebral white matter bilaterally are nonspecific but compatible with moderate chronic small vessel ischemic disease. There is mild cerebral atrophy. Orbits are unremarkable. Small bilateral maxillary sinus mucous retention cysts and a small right mastoid effusion are noted. The distal right vertebral artery flow void is abnormal in appearance. Other  major intracranial vascular flow voids are preserved. IMPRESSION: 1. No  acute infarct. 2. Moderate chronic small vessel ischemic disease. 3. Abnormal distal right vertebral artery which may reflect diminished flow from a proximal stenosis or occlusion. 4. Small syrinx versus artifact in the upper cervical spinal cord. Cervical spine MRI suggested for further evaluation. Electronically Signed   By: Sebastian Ache M.D.   On: 09/02/2015 20:07   Ct Renal Stone Study  09/02/2015  CLINICAL DATA:  Diffuse abdominal pain. Dizziness. Vomiting. Left flank pain. EXAM: CT ABDOMEN AND PELVIS WITHOUT CONTRAST TECHNIQUE: Multidetector CT imaging of the abdomen and pelvis was performed following the standard protocol without IV contrast. COMPARISON:  None. FINDINGS: Lower chest: There is a small hiatal hernia. Lung bases are clear. Heart size is normal. Hepatobiliary: 2 cm cyst in the right lobe of the liver adjacent to the gallbladder fossa. Gallbladder has been removed. No biliary tree dilatation. Pancreas: Normal. Spleen: Normal. Adrenals/Urinary Tract: Adrenal glands are normal. No renal calculi. 15 mm low-density lesion on the tip of the lower pole of the right kidney, most likely a cyst. Cortical irregularity of the lower pole possibly from previous infarct. There is a 9 mm low-density area in the right upper pole and a 10 mm low-density in the left upper pole. These are not well enough seen characterization. New the ureters and bladder appear normal. Stomach/Bowel: Small hiatal hernia. Otherwise normal including the terminal ileum and appendix. Vascular/Lymphatic: Extensive atherosclerotic disease of the aorta and iliac arteries and common femoral arteries. No adenopathy. Reproductive: Normal. Other: No free air or free fluid. Musculoskeletal: No acute abnormality. Chronic fusion of the sacroiliac joints. IMPRESSION: Small hiatal hernia. Otherwise benign appearing abdomen. Small low-density areas in the upper poles of both  kidneys are nonspecific the lower pole cyst on the right was visible are abdominal ultrasound dated 10/15/2004. Renal ultrasound on an elective basis could be used for further characterization. Electronically Signed   By: Francene Boyers M.D.   On: 09/02/2015 14:41    Micro Results     No results found for this or any previous visit (from the past 240 hour(s)).     Today   Subjective:   Thomas Bullock today has no headache,no chest abdominal pain,no new weakness tingling or numbness, feels much better wants to go home today.  Objective:   Blood pressure 104/73, pulse 66, temperature 97.8 F (36.6 C), temperature source Oral, resp. rate 18, height 5\' 7"  (1.702 m), weight 76.522 kg (168 lb 11.2 oz), SpO2 99 %.   Intake/Output Summary (Last 24 hours) at 09/06/15 1442 Last data filed at 09/06/15 1351  Gross per 24 hour  Intake    358 ml  Output   1650 ml  Net  -1292 ml    Exam Awake Alert, Oriented x 3, No new F.N deficits, Normal affect [place].AT,PERRAL Supple Neck,No JVD, No cervical lymphadenopathy appriciated.  Symmetrical Chest wall movement, Good air movement bilaterally, CTAB RRR,No Gallops,Rubs or new Murmurs, No Parasternal Heave +ve B.Sounds, Abd Soft, Non tender, No organomegaly appriciated, No rebound -guarding or rigidity. No Cyanosis, Clubbing or edema, No new Rash or bruise  Data Review   CBC w Diff:  Lab Results  Component Value Date   WBC 5.5 09/04/2015   HGB 13.4 09/04/2015   HCT 39.6 09/04/2015   PLT 189 09/04/2015   LYMPHOPCT 39 08/07/2013   MONOPCT 9 08/07/2013   EOSPCT 1 08/07/2013   BASOPCT 1 08/07/2013    CMP:  Lab Results  Component Value Date   NA 138 09/04/2015  K 3.6 09/04/2015   CL 108 09/04/2015   CO2 22 09/04/2015   BUN 11 09/04/2015   CREATININE 1.56* 09/04/2015   CREATININE 1.23 02/28/2015   PROT 7.2 09/02/2015   ALBUMIN 3.6 09/02/2015   BILITOT 1.0 09/02/2015   ALKPHOS 75 09/02/2015   AST 44* 09/02/2015   ALT 34  09/02/2015  .   Total Time in preparing paper work, data evaluation and todays exam - 35 minutes  Thomas Bullock M.D on 09/06/2015 at 2:42 PM  [facility]   Office  [phone]

## 2015-09-06 NOTE — Progress Notes (Signed)
NURSING PROGRESS NOTE  Thomas Bullock [id number] Discharge Data: 09/06/2015 5:23 PM Attending Provider: Starleen Arms, MD PCP:No PCP Per Patient     Orlan Leavens to be D/C'd Home per MD order.  Discussed with the patient the After Visit Summary and all questions fully answered. All IV's discontinued with no bleeding noted. All belongings returned to patient for patient to take home. Pt's prescriptions were called into the pharmacy at [facility]. Pt taken downstairs accompanied by staff member. Pt requesting to sit and wait for his ride outside.  Last Vital Signs:  Blood pressure 104/73, pulse 66, temperature 97.8 F (36.6 C), temperature source Oral, resp. rate 18, height 5\' 7"  (1.702 m), weight 76.522 kg (168 lb 11.2 oz), SpO2 99 %.  Discharge Medication List   Medication List    STOP taking these medications        benazepril 40 MG tablet  Commonly known as:  LOTENSIN      TAKE these medications        aspirin 325 MG EC tablet  Take 1 tablet (325 mg total) by mouth daily.     atorvastatin 20 MG tablet  Commonly known as:  LIPITOR  Take 1 tablet (20 mg total) by mouth daily at 6 PM.     carvedilol 12.5 MG tablet  Commonly known as:  COREG  TAKE ONE TABLET BY MOUTH TWICE DAILY WITH A MEAL     digoxin 0.125 MG tablet  Commonly known as:  LANOXIN  TAKE ONE-HALF TABLET BY MOUTH ONCE DAILY     furosemide 40 MG tablet  Commonly known as:  LASIX  TAKE ONE TABLET BY MOUTH ONCE DAILY     hydrALAZINE 25 MG tablet  Commonly known as:  APRESOLINE  TAKE ONE AND ONE-HALF TABLETS BY MOUTH THREE TIMES DAILY     isosorbide dinitrate 20 MG tablet  Commonly known as:  ISORDIL  TAKE ONE TABLET BY MOUTH THREE TIMES DAILY     pantoprazole 40 MG tablet  Commonly known as:  PROTONIX  Take 1 tablet (40 mg total) by mouth daily.     spironolactone 25 MG tablet  Commonly known as:  ALDACTONE  TAKE ONE-HALF TABLET BY MOUTH TWICE DAILY

## 2015-09-07 DIAGNOSIS — Z87891 Personal history of nicotine dependence: Secondary | ICD-10-CM | POA: Diagnosis not present

## 2015-09-07 DIAGNOSIS — I5020 Unspecified systolic (congestive) heart failure: Secondary | ICD-10-CM | POA: Diagnosis not present

## 2015-09-07 DIAGNOSIS — N183 Chronic kidney disease, stage 3 (moderate): Secondary | ICD-10-CM | POA: Diagnosis not present

## 2015-09-07 DIAGNOSIS — I429 Cardiomyopathy, unspecified: Secondary | ICD-10-CM | POA: Diagnosis not present

## 2015-09-07 DIAGNOSIS — I129 Hypertensive chronic kidney disease with stage 1 through stage 4 chronic kidney disease, or unspecified chronic kidney disease: Secondary | ICD-10-CM | POA: Diagnosis not present

## 2015-09-07 DIAGNOSIS — I2510 Atherosclerotic heart disease of native coronary artery without angina pectoris: Secondary | ICD-10-CM | POA: Diagnosis not present

## 2015-09-07 DIAGNOSIS — Z8673 Personal history of transient ischemic attack (TIA), and cerebral infarction without residual deficits: Secondary | ICD-10-CM | POA: Diagnosis not present

## 2015-09-07 DIAGNOSIS — H532 Diplopia: Secondary | ICD-10-CM | POA: Diagnosis not present

## 2015-09-07 DIAGNOSIS — K219 Gastro-esophageal reflux disease without esophagitis: Secondary | ICD-10-CM | POA: Diagnosis not present

## 2015-09-07 DIAGNOSIS — R262 Difficulty in walking, not elsewhere classified: Secondary | ICD-10-CM | POA: Diagnosis not present

## 2015-09-07 LAB — HEMOGLOBIN A1C
HEMOGLOBIN A1C: 5.4 % (ref 4.8–5.6)
Mean Plasma Glucose: 108 mg/dL

## 2015-09-09 ENCOUNTER — Encounter: Payer: Self-pay | Admitting: General Practice

## 2015-09-09 ENCOUNTER — Ambulatory Visit: Payer: Medicare Other | Attending: Physician Assistant | Admitting: Physician Assistant

## 2015-09-09 VITALS — BP 132/81 | HR 82 | Temp 98.5°F | Resp 16 | Ht 66.0 in | Wt 174.8 lb

## 2015-09-09 DIAGNOSIS — K219 Gastro-esophageal reflux disease without esophagitis: Secondary | ICD-10-CM | POA: Diagnosis not present

## 2015-09-09 DIAGNOSIS — N183 Chronic kidney disease, stage 3 (moderate): Secondary | ICD-10-CM

## 2015-09-09 DIAGNOSIS — I10 Essential (primary) hypertension: Secondary | ICD-10-CM | POA: Diagnosis not present

## 2015-09-09 DIAGNOSIS — Z7982 Long term (current) use of aspirin: Secondary | ICD-10-CM | POA: Insufficient documentation

## 2015-09-09 DIAGNOSIS — R935 Abnormal findings on diagnostic imaging of other abdominal regions, including retroperitoneum: Secondary | ICD-10-CM | POA: Diagnosis not present

## 2015-09-09 DIAGNOSIS — I429 Cardiomyopathy, unspecified: Secondary | ICD-10-CM | POA: Diagnosis not present

## 2015-09-09 DIAGNOSIS — I2510 Atherosclerotic heart disease of native coronary artery without angina pectoris: Secondary | ICD-10-CM | POA: Diagnosis not present

## 2015-09-09 DIAGNOSIS — G459 Transient cerebral ischemic attack, unspecified: Secondary | ICD-10-CM | POA: Insufficient documentation

## 2015-09-09 DIAGNOSIS — H532 Diplopia: Secondary | ICD-10-CM

## 2015-09-09 DIAGNOSIS — I129 Hypertensive chronic kidney disease with stage 1 through stage 4 chronic kidney disease, or unspecified chronic kidney disease: Secondary | ICD-10-CM | POA: Diagnosis not present

## 2015-09-09 DIAGNOSIS — Z9049 Acquired absence of other specified parts of digestive tract: Secondary | ICD-10-CM | POA: Insufficient documentation

## 2015-09-09 DIAGNOSIS — Z9889 Other specified postprocedural states: Secondary | ICD-10-CM | POA: Insufficient documentation

## 2015-09-09 DIAGNOSIS — I5020 Unspecified systolic (congestive) heart failure: Secondary | ICD-10-CM | POA: Diagnosis not present

## 2015-09-09 DIAGNOSIS — R262 Difficulty in walking, not elsewhere classified: Secondary | ICD-10-CM | POA: Diagnosis not present

## 2015-09-09 DIAGNOSIS — Z79899 Other long term (current) drug therapy: Secondary | ICD-10-CM | POA: Diagnosis not present

## 2015-09-09 DIAGNOSIS — I5022 Chronic systolic (congestive) heart failure: Secondary | ICD-10-CM | POA: Insufficient documentation

## 2015-09-09 DIAGNOSIS — H538 Other visual disturbances: Secondary | ICD-10-CM | POA: Insufficient documentation

## 2015-09-09 DIAGNOSIS — G458 Other transient cerebral ischemic attacks and related syndromes: Secondary | ICD-10-CM

## 2015-09-09 DIAGNOSIS — Z8673 Personal history of transient ischemic attack (TIA), and cerebral infarction without residual deficits: Secondary | ICD-10-CM | POA: Diagnosis not present

## 2015-09-09 NOTE — Progress Notes (Signed)
Thomas Bullock  ZOX:[id number]  [id number]  DOB - Oct 28, 1950  Chief Complaint  Patient presents with  . Blurred Vision  . Transient Ischemic Attack       Subjective:   Thomas Bullock is a 65 y.o. male here today for establishment of care. He has a history of hypertension, systolic congestive heart failure, nonischemic cardiomyopathy. He is ejection fraction in 2013 was 20-25%. In 2015 and had recovered to normal. He is followed by cardiology. He has a history of mild nonobstructive coronary artery disease and chronic kidney disease, stage III. He went to the emergency department on 09/02/2015 with dizziness, lightheadedness, vomiting, double vision and unsteady gait that had been going on for 24 hours prior to arrival. In the ED he received supportive care with Zofran and IV fluids. A CT scan of his head was negative for acute process. CT of abdomen and pelvis looked okay with the exception of a left (low density area of the kidney. MRI showed no acute process but suggested right vertebral artery. Her circulation confirmed by Doppler. There was no evidence of hemodynamic significant stenosis in the carotid arteries. He was seen by GI. He is supposed to follow-up with neurology as an outpatient. He was started on aspirin and statins. Home health physical therapy was arranged as well as durable medical equipment.  He states that he feels much better overall. He still has some blurry vision and double vision. No chest pain. No shortness of breath. His speech is back to normal. No dizziness. He is a nonsmoker. Occasionally consumes alcohol. He has been compliant with his medications.  ROS: GEN: denies fever or chills, denies change in weight Skin: denies lesions or rashes HEENT: denies headache, earache, epistaxis, sore throat, or neck pain; +blurry and double vision LUNGS: denies SHOB, dyspnea, PND, orthopnea CV: denies CP or palpitations ABD: denies abd pain, N or V EXT: denies  muscle spasms or swelling; no pain in lower ext, + weakness NEURO: denies numbness or tingling, denies sz, stroke or +TIA  ALLERGIES: No Known Allergies  PAST MEDICAL HISTORY: Past Medical History  Diagnosis Date  . Hypertension   . Acute pulmonary edema (HCC) 10/04/2011  . LVH (left ventricular hypertrophy) 10/04/2011  . Systolic CHF (HCC) 11/07/2011    Hospitalized for flash pulmonary edema in June 2012 due to malignant HTN.   EF was 20-25% with severe global hypokinesis. left heart cath which revealed an LAD with 20% smooth narrowing but otherwise normal coronaries. On ace inhibitor, spironalactone, carvedilol. Diuresed in hospital.  Life Vest was ordered and fitted by [employer] to wear until September.     Marland Kitchen GERD (gastroesophageal reflux disease) 10/04/2011  . Coronary artery disease     PAST SURGICAL HISTORY: Past Surgical History  Procedure Laterality Date  . Dental surgery    . Cholecystectomy, laparoscopic  2006  . Cholecystectomy    . Left heart catheterization with coronary angiogram N/A 10/05/2011    Procedure: LEFT HEART CATHETERIZATION WITH CORONARY ANGIOGRAM;  Surgeon: Lennette Bihari, MD;  Location: [facility] CATH LAB;  Service: Cardiovascular;  Laterality: N/A;  . Right heart catheterization  10/05/2011    Procedure: RIGHT HEART CATH;  Surgeon: Lennette Bihari, MD;  Location: [facility] CATH LAB;  Service: Cardiovascular;;    MEDICATIONS AT HOME: Prior to Admission medications   Medication Sig Start Date End Date Taking? Authorizing Provider  aspirin EC 325 MG EC tablet Take 1 tablet (325 mg total) by mouth daily. 09/06/15  Yes Dawood S Elgergawy,  MD  atorvastatin (LIPITOR) 20 MG tablet Take 1 tablet (20 mg total) by mouth daily at 6 PM. 09/06/15  Yes Leana Roe Elgergawy, MD  carvedilol (COREG) 12.5 MG tablet TAKE ONE TABLET BY MOUTH TWICE DAILY WITH A MEAL 06/27/15  Yes Lennette Bihari, MD  digoxin (LANOXIN) 0.125 MG tablet TAKE ONE-HALF TABLET BY MOUTH ONCE DAILY 04/20/15  Yes Lennette Bihari, MD    furosemide (LASIX) 40 MG tablet TAKE ONE TABLET BY MOUTH ONCE DAILY 07/05/15  Yes Lennette Bihari, MD  hydrALAZINE (APRESOLINE) 25 MG tablet TAKE ONE AND ONE-HALF TABLETS BY MOUTH THREE TIMES DAILY 08/01/15  Yes Lennette Bihari, MD  isosorbide dinitrate (ISORDIL) 20 MG tablet TAKE ONE TABLET BY MOUTH THREE TIMES DAILY 05/23/15  Yes Lennette Bihari, MD  spironolactone (ALDACTONE) 25 MG tablet TAKE ONE-HALF TABLET BY MOUTH TWICE DAILY 10/21/14  Yes Lennette Bihari, MD  pantoprazole (PROTONIX) 40 MG tablet Take 1 tablet (40 mg total) by mouth daily. Patient not taking: Reported on 09/02/2015 02/28/15   Lennette Bihari, MD     Objective:   Filed Vitals:   09/09/15 0921  BP: 132/81  Pulse: 82  Temp: 98.5 F (36.9 C)  TempSrc: Oral  Resp: 16  Height: 5\' 6"  (1.676 m)  Weight: 174 lb 12.8 oz (79.289 kg)  SpO2: 98%    Exam General appearance : Awake, alert, not in any distress. Speech Clear. Not toxic looking HEENT: Atraumatic and Normocephalic, pupils equally reactive to light and accomodation Neck: supple, no JVD. No cervical lymphadenopathy.  Chest:Good air entry bilaterally, no added sounds  CVS: S1 S2 regular, no murmurs.  Abdomen: Bowel sounds present, Non tender and not distended with no gaurding, rigidity or rebound. Extremities: B/L Lower Ext shows no edema, both legs are warm to touch Neurology: Awake alert, and oriented X 3, CN II-XII intact, Non focal Skin:No Rash   Data Review Lab Results  Component Value Date   HGBA1C 5.4 09/06/2015   HGBA1C 5.4 09/04/2015   HGBA1C 5.4 10/05/2011     Assessment & Plan  1. TIA/Vertebral Artery Dysfunction  -Aggressive RF modification  -Neurology referral   -Cont antihypertensives, statin, ASA  -Cont HHPT/DME as ordered  2. Diplopia/Blurry Vision   opthalmology referral   3. Abn CT abd/pelvis - left upper pole low density area  -renal US  4. Hx NICM/hx systolic CHF-recovered EF  -Cont BB, not on ACE or ARB 2/2 CKD  -on Bidil,  Aldactone 5. HTN  -DASH diet  -continue current meds    Return in about 2 weeks (around 09/23/2015).  The patient was given clear instructions to go to ER or return to medical center if symptoms don't improve, worsen or new problems develop. The patient verbalized understanding. The patient was told to call to get lab results if they haven't heard anything in the next week.   This note has been created with Education officer, environmental. Any transcriptional errors are unintentional.    Scot Jun, PA-C [facility] [place], [place] [phone]   09/09/2015, 9:44 AM

## 2015-09-09 NOTE — Progress Notes (Signed)
Patient's here for HFU TIA. Patient c/o blurred vision since admitted in hospital and post d/c.  Patient feeling weak, but denies any pain.  Per [name], patient vision acuity will not be perform on today's OV. Patient will be referred out to a specialist.

## 2015-09-13 DIAGNOSIS — Z8673 Personal history of transient ischemic attack (TIA), and cerebral infarction without residual deficits: Secondary | ICD-10-CM | POA: Diagnosis not present

## 2015-09-13 DIAGNOSIS — I129 Hypertensive chronic kidney disease with stage 1 through stage 4 chronic kidney disease, or unspecified chronic kidney disease: Secondary | ICD-10-CM | POA: Diagnosis not present

## 2015-09-13 DIAGNOSIS — I2510 Atherosclerotic heart disease of native coronary artery without angina pectoris: Secondary | ICD-10-CM | POA: Diagnosis not present

## 2015-09-13 DIAGNOSIS — I5020 Unspecified systolic (congestive) heart failure: Secondary | ICD-10-CM | POA: Diagnosis not present

## 2015-09-13 DIAGNOSIS — R262 Difficulty in walking, not elsewhere classified: Secondary | ICD-10-CM | POA: Diagnosis not present

## 2015-09-13 DIAGNOSIS — H532 Diplopia: Secondary | ICD-10-CM | POA: Diagnosis not present

## 2015-09-14 ENCOUNTER — Ambulatory Visit (HOSPITAL_COMMUNITY): Payer: Medicare Other

## 2015-09-14 ENCOUNTER — Ambulatory Visit (HOSPITAL_COMMUNITY)
Admission: RE | Admit: 2015-09-14 | Discharge: 2015-09-14 | Disposition: A | Discharge status: Home / Self Care | Payer: Medicare Other | Source: Ambulatory Visit | Attending: Physician Assistant | Admitting: Physician Assistant

## 2015-09-14 DIAGNOSIS — N183 Chronic kidney disease, stage 3 (moderate): Secondary | ICD-10-CM | POA: Diagnosis not present

## 2015-09-14 DIAGNOSIS — I10 Essential (primary) hypertension: Secondary | ICD-10-CM

## 2015-09-14 DIAGNOSIS — N281 Cyst of kidney, acquired: Secondary | ICD-10-CM | POA: Diagnosis not present

## 2015-09-14 DIAGNOSIS — R935 Abnormal findings on diagnostic imaging of other abdominal regions, including retroperitoneum: Secondary | ICD-10-CM | POA: Insufficient documentation

## 2015-09-16 DIAGNOSIS — I5020 Unspecified systolic (congestive) heart failure: Secondary | ICD-10-CM | POA: Diagnosis not present

## 2015-09-16 DIAGNOSIS — R262 Difficulty in walking, not elsewhere classified: Secondary | ICD-10-CM | POA: Diagnosis not present

## 2015-09-16 DIAGNOSIS — I2510 Atherosclerotic heart disease of native coronary artery without angina pectoris: Secondary | ICD-10-CM | POA: Diagnosis not present

## 2015-09-16 DIAGNOSIS — Z8673 Personal history of transient ischemic attack (TIA), and cerebral infarction without residual deficits: Secondary | ICD-10-CM | POA: Diagnosis not present

## 2015-09-16 DIAGNOSIS — H532 Diplopia: Secondary | ICD-10-CM | POA: Diagnosis not present

## 2015-09-16 DIAGNOSIS — I129 Hypertensive chronic kidney disease with stage 1 through stage 4 chronic kidney disease, or unspecified chronic kidney disease: Secondary | ICD-10-CM | POA: Diagnosis not present

## 2015-09-19 ENCOUNTER — Telehealth: Payer: Self-pay

## 2015-09-19 NOTE — Telephone Encounter (Signed)
-----   Message from Vivianne Master, [place] sent at 09/14/2015  2:31 PM EDT ----- Hi,  Please let him know that his renal ultrasound showed some small cysts and that all, otherwise normal. Nothing to do at this time. Thanks.   ----- Message -----    From: Rad Results In Interface    Sent: 09/14/2015   1:50 PM      To: Vivianne Master, PA-C

## 2015-09-19 NOTE — Telephone Encounter (Signed)
Placed call to patient, patient verified name and DOB. Patient was given lab results, verbalize understanding with no further questions.

## 2015-09-20 DIAGNOSIS — H40013 Open angle with borderline findings, low risk, bilateral: Secondary | ICD-10-CM | POA: Diagnosis not present

## 2015-09-20 DIAGNOSIS — H532 Diplopia: Secondary | ICD-10-CM | POA: Diagnosis not present

## 2015-09-20 DIAGNOSIS — H5315 Visual distortions of shape and size: Secondary | ICD-10-CM | POA: Diagnosis not present

## 2015-09-20 DIAGNOSIS — H2513 Age-related nuclear cataract, bilateral: Secondary | ICD-10-CM | POA: Diagnosis not present

## 2015-09-21 DIAGNOSIS — R262 Difficulty in walking, not elsewhere classified: Secondary | ICD-10-CM | POA: Diagnosis not present

## 2015-09-21 DIAGNOSIS — I129 Hypertensive chronic kidney disease with stage 1 through stage 4 chronic kidney disease, or unspecified chronic kidney disease: Secondary | ICD-10-CM | POA: Diagnosis not present

## 2015-09-21 DIAGNOSIS — H532 Diplopia: Secondary | ICD-10-CM | POA: Diagnosis not present

## 2015-09-21 DIAGNOSIS — I5020 Unspecified systolic (congestive) heart failure: Secondary | ICD-10-CM | POA: Diagnosis not present

## 2015-09-21 DIAGNOSIS — I2510 Atherosclerotic heart disease of native coronary artery without angina pectoris: Secondary | ICD-10-CM | POA: Diagnosis not present

## 2015-09-21 DIAGNOSIS — Z8673 Personal history of transient ischemic attack (TIA), and cerebral infarction without residual deficits: Secondary | ICD-10-CM | POA: Diagnosis not present

## 2015-09-23 ENCOUNTER — Encounter: Payer: Self-pay | Admitting: Family Medicine

## 2015-09-23 ENCOUNTER — Ambulatory Visit: Payer: Medicare Other | Attending: Family Medicine | Admitting: Family Medicine

## 2015-09-23 VITALS — BP 131/81 | HR 65 | Temp 98.1°F | Resp 16 | Ht 66.0 in | Wt 176.1 lb

## 2015-09-23 DIAGNOSIS — N189 Chronic kidney disease, unspecified: Secondary | ICD-10-CM

## 2015-09-23 DIAGNOSIS — Z79899 Other long term (current) drug therapy: Secondary | ICD-10-CM | POA: Insufficient documentation

## 2015-09-23 DIAGNOSIS — I2510 Atherosclerotic heart disease of native coronary artery without angina pectoris: Secondary | ICD-10-CM | POA: Diagnosis not present

## 2015-09-23 DIAGNOSIS — K219 Gastro-esophageal reflux disease without esophagitis: Secondary | ICD-10-CM | POA: Diagnosis not present

## 2015-09-23 DIAGNOSIS — G459 Transient cerebral ischemic attack, unspecified: Secondary | ICD-10-CM | POA: Insufficient documentation

## 2015-09-23 DIAGNOSIS — I429 Cardiomyopathy, unspecified: Secondary | ICD-10-CM

## 2015-09-23 DIAGNOSIS — N183 Chronic kidney disease, stage 3 unspecified: Secondary | ICD-10-CM

## 2015-09-23 DIAGNOSIS — I10 Essential (primary) hypertension: Secondary | ICD-10-CM | POA: Diagnosis not present

## 2015-09-23 DIAGNOSIS — I517 Cardiomegaly: Secondary | ICD-10-CM | POA: Insufficient documentation

## 2015-09-23 DIAGNOSIS — H532 Diplopia: Secondary | ICD-10-CM | POA: Diagnosis not present

## 2015-09-23 DIAGNOSIS — Z7982 Long term (current) use of aspirin: Secondary | ICD-10-CM | POA: Diagnosis not present

## 2015-09-23 DIAGNOSIS — I129 Hypertensive chronic kidney disease with stage 1 through stage 4 chronic kidney disease, or unspecified chronic kidney disease: Secondary | ICD-10-CM | POA: Insufficient documentation

## 2015-09-23 DIAGNOSIS — I428 Other cardiomyopathies: Secondary | ICD-10-CM

## 2015-09-23 NOTE — Patient Instructions (Signed)
Transient Ischemic Attack °A transient ischemic attack (TIA) is a "warning stroke" that causes stroke-like symptoms. Unlike a stroke, a TIA does not cause permanent damage to the brain. The symptoms of a TIA can happen very fast and do not last long. It is important to know the symptoms of a TIA and what to do. This can help prevent a major stroke or death. °CAUSES  °A TIA is caused by a temporary blockage in an artery in the brain or neck (carotid artery). The blockage does not allow the brain to get the blood supply it needs and can cause different symptoms. The blockage can be caused by either: °· A blood clot. °· Fatty buildup (plaque) in a neck or brain artery. °RISK FACTORS °· High blood pressure (hypertension). °· High cholesterol. °· Diabetes mellitus. °· Heart disease. °· The buildup of plaque in the blood vessels (peripheral artery disease or atherosclerosis). °· The buildup of plaque in the blood vessels that provide blood and oxygen to the brain (carotid artery stenosis). °· An abnormal heart rhythm (atrial fibrillation). °· Obesity. °· Using any tobacco products, including cigarettes, chewing tobacco, or electronic cigarettes. °· Taking oral contraceptives, especially in combination with using tobacco. °· Physical inactivity. °· A diet high in fats, salt (sodium), and calories. °· Excessive alcohol use. °· Use of illegal drugs (especially cocaine and methamphetamine). °· Being male. °· Being African American. °· Being over the age of 55 years. °· Family history of stroke. °· Previous history of blood clots, stroke, TIA, or heart attack. °· Sickle cell disease. °SIGNS AND SYMPTOMS  °TIA symptoms are the same as a stroke but are temporary. These symptoms usually develop suddenly, or may be newly present upon waking from sleep: °· Sudden weakness or numbness of the face, arm, or leg, especially on one side of the body. °· Sudden trouble walking or difficulty moving arms or legs. °· Sudden  confusion. °· Sudden personality changes. °· Trouble speaking (aphasia) or understanding. °· Difficulty swallowing. °· Sudden trouble seeing in one or both eyes. °· Double vision. °· Dizziness. °· Loss of balance or coordination. °· Sudden severe headache with no known cause. °· Trouble reading or writing. °· Loss of bowel or bladder control. °· Loss of consciousness. °DIAGNOSIS  °Your health care provider may be able to determine the presence or absence of a TIA based on your symptoms, history, and physical exam. CT scan of the brain is usually performed to help identify a TIA. Other tests may include: °· Electrocardiography (ECG). °· Continuous heart monitoring. °· Echocardiography. °· Carotid ultrasonography. °· MRI. °· A scan of the brain circulation. °· Blood tests. °TREATMENT  °Since the symptoms of TIA are the same as a stroke, it is important to seek treatment as soon as possible. You may need a medicine to dissolve a blood clot (thrombolytic) if that is the cause of the TIA. This medicine cannot be given if too much time has passed. Treatment may also include:  °· Rest, oxygen, fluids through an IV tube, and medicines to thin the blood (anticoagulants). °· Measures will be taken to prevent short-term and long-term complications, including infection from breathing foreign material into the lungs (aspiration pneumonia), blood clots in the legs, and falls. °· Procedures to either remove plaque in the carotid arteries or dilate carotid arteries that have narrowed due to plaque. Those procedures are: °¨ Carotid endarterectomy. °¨ Carotid angioplasty and stenting. °· Medicines and diet may be used to address diabetes, high blood pressure, and   other underlying risk factors. °HOME CARE INSTRUCTIONS  °· Take medicines only as directed by your health care provider. Follow the directions carefully. Medicines may be used to control risk factors for a stroke. Be sure you understand all your medicine instructions. °· You  may be told to take aspirin or the anticoagulant warfarin. Warfarin needs to be taken exactly as instructed. °¨ Taking too much or too little warfarin is dangerous. Too much warfarin increases the risk of bleeding. Too little warfarin continues to allow the risk for blood clots. While taking warfarin, you will need to have regular blood tests to measure your blood clotting time. A PT blood test measures how long it takes for blood to clot. Your PT is used to calculate another value called an INR. Your PT and INR help your health care provider to adjust your dose of warfarin. The dose can change for many reasons. It is critically important that you take warfarin exactly as prescribed. °¨ Many foods, especially foods high in vitamin K can interfere with warfarin and affect the PT and INR. Foods high in vitamin K include spinach, kale, broccoli, cabbage, collard and turnip greens, Brussels sprouts, peas, cauliflower, seaweed, and parsley, as well as beef and pork liver, green tea, and soybean oil. You should eat a consistent amount of foods high in vitamin K. Avoid major changes in your diet, or notify your health care provider before changing your diet. Arrange a visit with a dietitian to answer your questions. °¨ Many medicines can interfere with warfarin and affect the PT and INR. You must tell your health care provider about any and all medicines you take; this includes all vitamins and supplements. Be especially cautious with aspirin and anti-inflammatory medicines. Do not take or discontinue any prescribed or over-the-counter medicine except on the advice of your health care provider or pharmacist. °¨ Warfarin can have side effects, such as excessive bruising or bleeding. You will need to hold pressure over cuts for longer than usual. Your health care provider or pharmacist will discuss other potential side effects. °¨ Avoid sports or activities that may cause injury or bleeding. °¨ Be careful when shaving,  flossing your teeth, or handling sharp objects. °¨ Alcohol can change the body's ability to handle warfarin. It is best to avoid alcoholic drinks or consume only very small amounts while taking warfarin. Notify your health care provider if you change your alcohol intake. °¨ Notify your dentist or other health care providers before procedures. °· Eat a diet that includes 5 or more servings of fruits and vegetables each day. This may reduce the risk of stroke. Certain diets may be prescribed to address high blood pressure, high cholesterol, diabetes, or obesity. °¨ A diet low in sodium, saturated fat, trans fat, and cholesterol is recommended to manage high blood pressure. °¨ A diet low in saturated fat, trans fat, and cholesterol, and high in fiber may control cholesterol levels. °¨ A controlled-carbohydrate, controlled-sugar diet is recommended to manage diabetes. °¨ A reduced-calorie diet that is low in sodium, saturated fat, trans fat, and cholesterol is recommended to manage obesity. °· Maintain a healthy weight. °· Stay physically active. It is recommended that you get at least 30 minutes of activity on most or all days. °· Do not use any tobacco products, including cigarettes, chewing tobacco, or electronic cigarettes. If you need help quitting, ask your health care provider. °· Limit alcohol intake to no more than 1 drink per day for nonpregnant women and 2 drinks   per day for men. One drink equals 12 ounces of beer, 5 ounces of wine, or 1½ ounces of hard liquor. °· Do not abuse drugs. °· A safe home environment is important to reduce the risk of falls. Your health care provider may arrange for specialists to evaluate your home. Having grab bars in the bedroom and bathroom is often important. Your health care provider may arrange for equipment to be used at home, such as raised toilets and a seat for the shower. °· Follow all instructions for follow-up with your health care provider. This is very important.  This includes any referrals and lab tests. Proper follow-up can prevent a stroke or another TIA from occurring. °PREVENTION  °The risk of a TIA can be decreased by appropriately treating high blood pressure, high cholesterol, diabetes, heart disease, and obesity, and by quitting smoking, limiting alcohol, and staying physically active. °SEEK MEDICAL CARE IF: °· You have personality changes. °· You have difficulty swallowing. °· You are seeing double. °· You have dizziness. °· You have a fever. °SEEK IMMEDIATE MEDICAL CARE IF:  °Any of the following symptoms may represent a serious problem that is an emergency. Do not wait to see if the symptoms will go away. Get medical help right away. Call your local emergency services (911 in [place] Do not drive yourself to the hospital. °· You have sudden weakness or numbness of the face, arm, or leg, especially on one side of the body. °· You have sudden trouble walking or difficulty moving arms or legs. °· You have sudden confusion. °· You have trouble speaking (aphasia) or understanding. °· You have sudden trouble seeing in one or both eyes. °· You have a loss of balance or coordination. °· You have a sudden, severe headache with no known cause. °· You have new chest pain or an irregular heartbeat. °· You have a partial or total loss of consciousness. °MAKE SURE YOU:  °· Understand these instructions. °· Will watch your condition. °· Will get help right away if you are not doing well or get worse. °  °This information is not intended to replace advice given to you by your health care provider. Make sure you discuss any questions you have with your health care provider. °  °Document Released: 01/31/2005 Document Revised: 05/14/2014 Document Reviewed: 07/29/2013 °Elsevier Interactive Patient Education ©2016 [employer]. ° °

## 2015-09-23 NOTE — Progress Notes (Signed)
Patient's here for 2wks f/up for HTN.  Patient denies any pain today.

## 2015-09-23 NOTE — Progress Notes (Addendum)
Subjective:  Patient ID: Thomas Bullock, male    DOB: [birth year]  Age: 65 y.o. MRN: [id number]  CC: Follow-up and Hypertension   HPI Thomas Bullock is a 65 year old male with a history of hypertension,Nonischemic cardiomyopathy, CHF (EF 55-60%, grade 1 diastolic dysfunction from 09/2015), stage III chronic kidney disease, recently hospitalized for TIA (CT head negative for acute process, MRI brain revealing distal right vertebral artery abnormality which may reflect diminished flow from proximal stenosis or occlusion).  He continues to experience diplopia for which he saw his ophthalmologist last week and has an upcoming appointment next week Tuesday. Denies weakness of any extremity numbness; he does have some stuttering which he states he has always had.  He was referred for renal ultrasound due to CT scan findings of low density in upper poles of both kidneys. Renal ultrasound revealed bilateral renal cyst, no other abnormalities.  He has an upcoming appointment with neurology in the first week of June.   Past Medical History  Diagnosis Date  . Hypertension   . Acute pulmonary edema (HCC) 10/04/2011  . LVH (left ventricular hypertrophy) 10/04/2011  . Systolic CHF (HCC) 11/07/2011    Hospitalized for flash pulmonary edema in June 2012 due to malignant HTN.   EF was 20-25% with severe global hypokinesis. left heart cath which revealed an LAD with 20% smooth narrowing but otherwise normal coronaries. On ace inhibitor, spironalactone, carvedilol. Diuresed in hospital.  Life Vest was ordered and fitted by [employer] to wear until September.     Marland Kitchen GERD (gastroesophageal reflux disease) 10/04/2011  . Coronary artery disease     Past Surgical History  Procedure Laterality Date  . Dental surgery    . Cholecystectomy, laparoscopic  2006  . Cholecystectomy    . Left heart catheterization with coronary angiogram N/A 10/05/2011    Procedure: LEFT HEART CATHETERIZATION WITH CORONARY ANGIOGRAM;   Surgeon: Lennette Bihari, MD;  Location: [facility] CATH LAB;  Service: Cardiovascular;  Laterality: N/A;  . Right heart catheterization  10/05/2011    Procedure: RIGHT HEART CATH;  Surgeon: Lennette Bihari, MD;  Location: [facility] CATH LAB;  Service: Cardiovascular;;     Outpatient Prescriptions Prior to Visit  Medication Sig Dispense Refill  . aspirin EC 325 MG EC tablet Take 1 tablet (325 mg total) by mouth daily. 30 tablet 2  . atorvastatin (LIPITOR) 20 MG tablet Take 1 tablet (20 mg total) by mouth daily at 6 PM. 30 tablet 2  . carvedilol (COREG) 12.5 MG tablet TAKE ONE TABLET BY MOUTH TWICE DAILY WITH A MEAL 60 tablet 4  . digoxin (LANOXIN) 0.125 MG tablet TAKE ONE-HALF TABLET BY MOUTH ONCE DAILY 15 tablet 6  . furosemide (LASIX) 40 MG tablet TAKE ONE TABLET BY MOUTH ONCE DAILY 30 tablet 3  . hydrALAZINE (APRESOLINE) 25 MG tablet TAKE ONE AND ONE-HALF TABLETS BY MOUTH THREE TIMES DAILY 135 tablet 6  . isosorbide dinitrate (ISORDIL) 20 MG tablet TAKE ONE TABLET BY MOUTH THREE TIMES DAILY 90 tablet 3  . pantoprazole (PROTONIX) 40 MG tablet Take 1 tablet (40 mg total) by mouth daily. 30 tablet 11  . spironolactone (ALDACTONE) 25 MG tablet TAKE ONE-HALF TABLET BY MOUTH TWICE DAILY 30 tablet 9   No facility-administered medications prior to visit.    ROS Review of Systems  Constitutional: Negative for activity change and appetite change.  HENT: Negative for sinus pressure and sore throat.   Eyes: Positive for visual disturbance.  Respiratory: Negative for cough, chest tightness  and shortness of breath.   Cardiovascular: Negative for chest pain and leg swelling.  Gastrointestinal: Negative for abdominal pain, diarrhea, constipation and abdominal distention.  Endocrine: Negative.   Genitourinary: Negative for dysuria.  Musculoskeletal: Negative for myalgias and joint swelling.  Skin: Negative for rash.  Allergic/Immunologic: Negative.   Neurological: Negative for weakness, light-headedness and  numbness.  Psychiatric/Behavioral: Negative for suicidal ideas and dysphoric mood.    Objective:  BP 131/81 mmHg  Pulse 65  Temp(Src) 98.1 F (36.7 C) (Oral)  Resp 16  Ht  (1.676 m)  Wt 176 lb 1.6 oz (79.878 kg)  BMI 28.44 kg/m2  SpO2 99%  BP/Weight 09/23/2015 09/09/2015 09/06/2015  Systolic BP 131 132 104  Diastolic BP 81 81 73  Wt. (Lbs) 176.1 174.8 -  BMI 28.44 28.23 -      Physical Exam  Constitutional: He is oriented to person, place, and time. He appears well-developed and well-nourished.  Cardiovascular: Normal rate, normal heart sounds and intact distal pulses.   No murmur heard. Pulmonary/Chest: Effort normal and breath sounds normal. He has no wheezes. He has no rales. He exhibits no tenderness.  Abdominal: Soft. Bowel sounds are normal. He exhibits no distension and no mass. There is no tenderness.  Musculoskeletal: Normal range of motion.  Neurological: He is alert and oriented to person, place, and time.  stuttering speech    CLINICAL DATA: Chronic renal disease  EXAM: RENAL / URINARY TRACT ULTRASOUND COMPLETE  COMPARISON: 09/02/2015  FINDINGS: Right Kidney:  Length: 11.1 cm. Small cysts are noted within the right kidney similar to that noted on prior CT examination. Largest of these measures 1.7 cm in the mid to lower pole.  Left Kidney:  Length: 11.0 cm. Upper pole cyst is noted measuring 12 mm.  Bladder:  Appears normal for degree of bladder distention.  IMPRESSION: Bilateral renal cysts. No acute abnormality noted.   Electronically Signed  By: Alcide Clever M.D.  On: 09/14/2015 13:47        Assessment & Plan:   1. Essential hypertension Controlled Low-sodium, DASH diet  2. Nonischemic cardiomyopathy (HCC) Aggressive risk factor modification Currently on a beta blocker, statin ; not on ACE inhibitor due to CKD Scheduled to see his cardiologist- Dr Tresa Endo  3. Chronic renal insufficiency, stage III  (moderate) Stable Avoid nephrotoxic agents We'll monitor renal function  4. Transient cerebral ischemia, unspecified transient cerebral ischemia type Scheduled deficits Advised to keep follow-up appointment with [facility] neurology  5. Diplopia Under the care of ophthalmology.   No orders of the defined types were placed in this encounter.    Follow-up: Return in about 3 months (around 12/24/2015) for Follow-up of hypertension and TIA.   Jaclyn Shaggy MD

## 2015-09-23 NOTE — Addendum Note (Signed)
Addended by: Jaclyn Shaggy on: 09/23/2015 10:23 AM   Modules accepted: Kipp Brood

## 2015-09-27 DIAGNOSIS — H532 Diplopia: Secondary | ICD-10-CM | POA: Diagnosis not present

## 2015-09-27 DIAGNOSIS — H40013 Open angle with borderline findings, low risk, bilateral: Secondary | ICD-10-CM | POA: Diagnosis not present

## 2015-09-27 DIAGNOSIS — H2513 Age-related nuclear cataract, bilateral: Secondary | ICD-10-CM | POA: Diagnosis not present

## 2015-10-10 ENCOUNTER — Ambulatory Visit (INDEPENDENT_AMBULATORY_CARE_PROVIDER_SITE_OTHER): Payer: Medicare Other | Admitting: Neurology

## 2015-10-10 ENCOUNTER — Encounter: Payer: Self-pay | Admitting: Neurology

## 2015-10-10 VITALS — BP 138/78 | HR 78 | Ht 66.0 in | Wt 180.0 lb

## 2015-10-10 DIAGNOSIS — R42 Dizziness and giddiness: Secondary | ICD-10-CM | POA: Diagnosis not present

## 2015-10-10 DIAGNOSIS — H532 Diplopia: Secondary | ICD-10-CM | POA: Diagnosis not present

## 2015-10-10 NOTE — Progress Notes (Signed)
Reason for visit: Vertigo, double vision  Referring physician: Dr. Rudi Coco is a 65 y.o. male  History of present illness:  Mr. Schatzel is a 65 year old left-handed black male with a history of hypertension, congestive heart failure, and coronary artery disease. The patient was noted to have onset of vertigo and double vision around 09/02/2015. The patient was admitted to the hospital for workup around April 30, and MRI of the brain did not show definite evidence of an acute stroke. There was some evidence of low flow in the right vertebral artery which was confirmed by carotid Doppler study. An echocardiogram of the heart did not show a cardiac source of stroke. The patient was placed on aspirin. The patient has cleared his symptoms within about 3 days, he denies any focal numbness or weakness of the face, arms, or legs. He denied any headache, hearing changes, he may of had some slurring of speech. He reported a horizontal double vision that improved when covering one eye or the other. He has undergone some physical therapy. He has done quite well, he believes that he has returned to his baseline without any residual deficits. He is sent to this office further evaluation. He is walking normally at this time without a cane or a walker.  Past Medical History  Diagnosis Date  . Hypertension   . Acute pulmonary edema (HCC) 10/04/2011  . LVH (left ventricular hypertrophy) 10/04/2011  . Systolic CHF (HCC) 11/07/2011    Hospitalized for flash pulmonary edema in June 2012 due to malignant HTN.   EF was 20-25% with severe global hypokinesis. left heart cath which revealed an LAD with 20% smooth narrowing but otherwise normal coronaries. On ace inhibitor, spironalactone, carvedilol. Diuresed in hospital.  Life Vest was ordered and fitted by [employer] to wear until September.     Marland Kitchen GERD (gastroesophageal reflux disease) 10/04/2011  . Coronary artery disease     Past Surgical History    Procedure Laterality Date  . Dental surgery    . Cholecystectomy, laparoscopic  2006  . Cholecystectomy    . Left heart catheterization with coronary angiogram N/A 10/05/2011    Procedure: LEFT HEART CATHETERIZATION WITH CORONARY ANGIOGRAM;  Surgeon: Lennette Bihari, MD;  Location: [facility] CATH LAB;  Service: Cardiovascular;  Laterality: N/A;  . Right heart catheterization  10/05/2011    Procedure: RIGHT HEART CATH;  Surgeon: Lennette Bihari, MD;  Location: [facility] CATH LAB;  Service: Cardiovascular;;    Family History  Problem Relation Age of Onset  . Kidney disease Mother     56  . Stroke Mother   . Diabetes type II Mother     Patient uncertain but history of multiple amuputations.   . Heart attack Father     2007-died of Heart attack @ age 29  . Heart disease Sister   . Heart disease Brother     Social history:  reports that he quit smoking about 42 years ago. His smoking use included Cigarettes. He has never used smokeless tobacco. He reports that he does not drink alcohol or use illicit drugs.  Medications:  Prior to Admission medications   Medication Sig Start Date End Date Taking? Authorizing Provider  aspirin EC 325 MG EC tablet Take 1 tablet (325 mg total) by mouth daily. 09/06/15  Yes Starleen Arms, MD  atorvastatin (LIPITOR) 20 MG tablet Take 1 tablet (20 mg total) by mouth daily at 6 PM. 09/06/15  Yes Starleen Arms, MD  carvedilol (COREG) 12.5 MG tablet TAKE ONE TABLET BY MOUTH TWICE DAILY WITH A MEAL 06/27/15  Yes Lennette Bihari, MD  digoxin (LANOXIN) 0.125 MG tablet TAKE ONE-HALF TABLET BY MOUTH ONCE DAILY 04/20/15  Yes Lennette Bihari, MD  furosemide (LASIX) 40 MG tablet TAKE ONE TABLET BY MOUTH ONCE DAILY 07/05/15  Yes Lennette Bihari, MD  hydrALAZINE (APRESOLINE) 25 MG tablet TAKE ONE AND ONE-HALF TABLETS BY MOUTH THREE TIMES DAILY 08/01/15  Yes Lennette Bihari, MD  isosorbide dinitrate (ISORDIL) 20 MG tablet TAKE ONE TABLET BY MOUTH THREE TIMES DAILY 05/23/15  Yes Lennette Bihari, MD  pantoprazole (PROTONIX) 40 MG tablet Take 1 tablet (40 mg total) by mouth daily. 02/28/15  Yes Lennette Bihari, MD  spironolactone (ALDACTONE) 25 MG tablet TAKE ONE-HALF TABLET BY MOUTH TWICE DAILY 10/21/14  Yes Lennette Bihari, MD  TRAVATAN Z 0.004 % SOLN ophthalmic solution  09/27/15  Yes Historical Provider, MD     No Known Allergies  ROS:  Out of a complete 14 system review of symptoms, the patient complains only of the following symptoms, and all other reviewed systems are negative.  Blurred vision, double vision Vertigo  Blood pressure 138/78, pulse 78, height 5\' 6"  (1.676 m), weight 180 lb (81.647 kg).  Physical Exam  General: The patient is alert and cooperative at the time of the examination.  Eyes: Pupils are equal, round, and reactive to light. Discs are flat bilaterally.  Neck: The neck is supple, no carotid bruits are noted.  Respiratory: The respiratory examination is clear.  Cardiovascular: The cardiovascular examination reveals a regular rate and rhythm, no obvious murmurs or rubs are noted.  Skin: Extremities are without significant edema.  Neurologic Exam  Mental status: The patient is alert and oriented x 3 at the time of the examination. The patient has apparent normal recent and remote memory, with an apparently normal attention span and concentration ability.  Cranial nerves: Facial symmetry is present. There is good sensation of the face to pinprick and soft touch bilaterally. The strength of the facial muscles and the muscles to head turning and shoulder shrug are normal bilaterally. Speech is well enunciated, no aphasia or dysarthria is noted. Extraocular movements are full. Visual fields are full. The tongue is midline, and the patient has symmetric elevation of the soft palate. No obvious hearing deficits are noted.  Motor: The motor testing reveals 5 over 5 strength of all 4 extremities. Good symmetric motor tone is noted throughout.  Sensory:  Sensory testing is intact to pinprick, soft touch, vibration sensation, and position sense on all 4 extremities. No evidence of extinction is noted.  Coordination: Cerebellar testing reveals good finger-nose-finger and heel-to-shin bilaterally.  Gait and station: Gait is normal. Tandem gait is normal. Romberg is negative. No drift is seen.  Reflexes: Deep tendon reflexes are symmetric and normal bilaterally. Toes are downgoing bilaterally.   MRI brain 09/02/15:  IMPRESSION: 1. No acute infarct. 2. Moderate chronic small vessel ischemic disease. 3. Abnormal distal right vertebral artery which may reflect diminished flow from a proximal stenosis or occlusion. 4. Small syrinx versus artifact in the upper cervical spinal cord. Cervical spine MRI suggested for further evaluation.  * MRI scan images were reviewed online. I agree with the written report.   Carotid doppler 09/05/15:  Vascular Ultrasound Carotid Duplex (Doppler) has been completed.  There is no obvious evidence of hemodynamically significant internal carotid artery stenosis bilaterally. Unable to detect flow in the right  vertebral artery by color and spectral Doppler, suggestive of possible occlusion. The left vertebral artery is patent with antegrade flow.   2D echo 09/06/15:  Study Conclusions  - Left ventricle: The cavity size was normal. There was severe  hypertrophy of the septum and mild posterior wall hypertrophy.  Systolic function was normal. The estimated ejection fraction was  in the range of 55% to 60%. Wall motion was normal; there were no  regional wall motion abnormalities. Doppler parameters are  consistent with abnormal left ventricular relaxation (grade 1  diastolic dysfunction). Doppler parameters are consistent with  high ventricular filling pressure. - Aortic valve: Transvalvular velocity was within the normal range.  There was no stenosis. There was mild to moderate regurgitation. -  Mitral valve: Transvalvular velocity was within the normal range.  There was no evidence for stenosis. There was no regurgitation. - Right ventricle: The cavity size was normal. Wall thickness was  normal. Systolic function was normal. - Atrial septum: No defect or patent foramen ovale was identified  by color flow Doppler. - Tricuspid valve: There was trivial regurgitation. - Inferior vena cava: The vessel was normal in size. The  respirophasic diameter changes were in the normal range (>= 50%),  consistent with normal central venous pressure.  Impressions:    Assessment/Plan:  1. Transient vertigo, double vision  The patient may have sustained a stroke affecting the brainstem area, the patient has reduced or occluded flow in the right vertebral artery. The MRI of the brain did not show evidence of a definite stroke event. At this point, the patient is back to his usual baseline. He denies any previous episodes of similar problems. The patient will remain on his aspirin, his blood pressures appear to be well controlled today. He is on cholesterol-lowering agents. He will follow-up through this office on an as-needed basis, he will contact me if any recurrence of double vision or vertigo is noted.  Marlan Palau MD 10/10/2015 8:35 PM  [facility] [address] [place], [place] [address]  Phone [phone] Fax [phone]

## 2015-11-04 ENCOUNTER — Other Ambulatory Visit: Payer: Self-pay | Admitting: Cardiovascular Disease

## 2015-11-11 DIAGNOSIS — H2513 Age-related nuclear cataract, bilateral: Secondary | ICD-10-CM | POA: Diagnosis not present

## 2015-11-11 DIAGNOSIS — H532 Diplopia: Secondary | ICD-10-CM | POA: Diagnosis not present

## 2015-11-11 DIAGNOSIS — H40013 Open angle with borderline findings, low risk, bilateral: Secondary | ICD-10-CM | POA: Diagnosis not present

## 2015-11-18 ENCOUNTER — Ambulatory Visit (INDEPENDENT_AMBULATORY_CARE_PROVIDER_SITE_OTHER): Payer: Medicare Other | Admitting: Cardiovascular Disease

## 2015-11-18 VITALS — BP 158/96 | HR 71 | Ht 66.0 in | Wt 183.6 lb

## 2015-11-18 DIAGNOSIS — Z79899 Other long term (current) drug therapy: Secondary | ICD-10-CM | POA: Diagnosis not present

## 2015-11-18 DIAGNOSIS — I429 Cardiomyopathy, unspecified: Secondary | ICD-10-CM

## 2015-11-18 DIAGNOSIS — I10 Essential (primary) hypertension: Secondary | ICD-10-CM

## 2015-11-18 DIAGNOSIS — I420 Dilated cardiomyopathy: Secondary | ICD-10-CM | POA: Diagnosis not present

## 2015-11-18 DIAGNOSIS — E785 Hyperlipidemia, unspecified: Secondary | ICD-10-CM

## 2015-11-18 DIAGNOSIS — I428 Other cardiomyopathies: Secondary | ICD-10-CM

## 2015-11-18 DIAGNOSIS — G459 Transient cerebral ischemic attack, unspecified: Secondary | ICD-10-CM

## 2015-11-18 DIAGNOSIS — I517 Cardiomegaly: Secondary | ICD-10-CM

## 2015-11-18 MED ORDER — ATORVASTATIN CALCIUM 40 MG PO TABS: 40.0000 mg | ORAL_TABLET | Freq: Every day | ORAL | Status: DC

## 2015-11-18 NOTE — Patient Instructions (Addendum)
Your physician recommends that you return for lab work in:  2 months.  Your physician has recommended you make the following change in your medication:  The atorvastatin has been increased from 20 mg to 40 mg. A new prescription has been sent to your pharmacy. You can take # 2 of the 20 mg tablets until completed then start the new 40 mg prescription.  Your physician wants you to follow-up in: 6 year or sooner if needed. You will receive a reminder letter in the mail two months in advance. If you don't receive a letter, please call our office to schedule the follow-up appointment.  If you need a refill on your cardiac medications before your next appointment, please call your pharmacy.

## 2015-11-23 ENCOUNTER — Encounter: Payer: Self-pay | Admitting: Cardiovascular Disease

## 2015-11-23 DIAGNOSIS — E785 Hyperlipidemia, unspecified: Secondary | ICD-10-CM | POA: Insufficient documentation

## 2015-11-23 NOTE — Progress Notes (Signed)
Patient ID: Thomas Bullock, male   DOB: Jun 20, 1950, 65 y.o.   MRN: [id number]    Primary MD: Dr. Jarold Song  HPI: Thomas Bullock is a 65 y.o. male with a history of a nonischemic cardiomyopathy who presents to the office for an 69-monthcardiology evaluation.  Mr. CMerryfielda long-standing history of hypertension. In May 2013 after being off his medications for some time [facility]. He was found to have a nonischemic cardiomyopathy with an ejection fraction of 20-25% and minimal CAD with 20% narrowing in the LAD noted at catheterization.With aggressive medical therapy and gradual titration of his medications  LV function has improved. An echo Doppler study in September 2013 showed an ejection fraction of 45% and an echo Doppler study on 01/26/2014 revealed complete normalization of LV function with an ejection fraction of 60-65%.  There was grade 1 diastolic dysfunction.  He had a determine LV filling pressure.  PA pressure was normal at 11 mm.  His  ascending aorta was moderately dilated up to 4.7 cm.   Since I last saw him, was hospitalized in April 2017 and was felt to have a TIA.  He presented with vertigo and double vision.  An MRI of the brain did not show definitive evidence of an acute stroke.  There was some evidence of low flow.  The right vertebral artery, which was confirmed by carotid Doppler study.  He underwent a 2-D echo Doppler study on 09/06/2015 which showed an EF of 55-60% with grade 1 diastolic dysfunction.  There was mild-to-moderate aortic insufficiency.  There was no evidence for cardiac source of TIA.  He was subtotally seen in neurology follow-up by Dr. WJannifer Franklinin June.  Presently he denies episodes of chest pain.  He has continued to do well the he denies any significant shortness of breath but still notes  mild shortness of breath with activity. He denies   palpitations, presyncope  syncope or arrhythmia.  He admits to  occasional episodes of indigestion and has been taking over-the-counter Tums for this with plus minus benefit.  He denies any edema.  He denies PND, orthopnea.  He has been on a medical regimen consisting of low-dose Lanoxin at 0.0625 mg daily, Bidil regimen although he has been taking the medications individually with 20 mg tid of Isordil and 37.5 mg 3 times a day of hydralazine, spironolactone 12.5 mg twice a day, furosemide 40 mg daily, and carvedilol 12.5 mg twice a day.  He apparently is no longer taking Lotensin.     Past Medical History  Diagnosis Date  . Hypertension   . [place] ([facility] 10/04/[facility]  . LVH (left ventricular hypertrophy) 10/04/2011  . Systolic CHF ([facility]25   Hospitalized for flash pulmonary edema in June 2012 due to malignant HTN.   EF was 20-25% with severe global hypokinesis. left heart cath which revealed an LAD with 20% smooth narrowing but otherwise normal coronaries. On ace inhibitor, spironalactone, carvedilol. Diuresed in hospital.  Life Vest was ordered and fitted by [facility] to wear until September.     .Marland KitchenGERD (gastroesophageal reflux disease) 10/04/2011  . Coronary artery disease     Past Surgical History  Procedure Laterality Date  . Dental surgery    . Cholecystectomy, laparoscopic  2006  . Cholecystectomy    . Left heart catheterization with coronary angiogram N/A 10/05/2011    Procedure: LEFT HEART CATHETERIZATION WITH CORONARY ANGIOGRAM;  Surgeon: TMarcello Moores  Floyce Stakes, MD;  Location: [facility] CATH LAB;  Service: Cardiovascular;  Laterality: N/A;  . Right heart catheterization  10/05/2011    Procedure: RIGHT HEART CATH;  Surgeon: Troy Sine, MD;  Location: [facility] CATH LAB;  Service: Cardiovascular;;    No Known Allergies  Current Outpatient Prescriptions  Medication Sig Dispense Refill  . aspirin EC 325 MG EC tablet Take 1 tablet (325 mg total) by mouth daily. 30 tablet 2  . carvedilol (COREG) 12.5 MG tablet TAKE ONE TABLET BY MOUTH TWICE DAILY WITH  A MEAL 60 tablet 4  . digoxin (LANOXIN) 0.125 MG tablet TAKE ONE-HALF TABLET BY MOUTH ONCE DAILY 15 tablet 6  . furosemide (LASIX) 40 MG tablet TAKE ONE TABLET BY MOUTH ONCE DAILY 30 tablet 3  . hydrALAZINE (APRESOLINE) 25 MG tablet TAKE ONE AND ONE-HALF TABLETS BY MOUTH THREE TIMES DAILY 135 tablet 6  . isosorbide dinitrate (ISORDIL) 20 MG tablet TAKE ONE TABLET BY MOUTH THREE TIMES DAILY 90 tablet 3  . pantoprazole (PROTONIX) 40 MG tablet Take 1 tablet (40 mg total) by mouth daily. 30 tablet 11  . spironolactone (ALDACTONE) 25 MG tablet TAKE ONE-HALF TABLET BY MOUTH TWICE DAILY 30 tablet 0  . TRAVATAN Z 0.004 % SOLN ophthalmic solution     . atorvastatin (LIPITOR) 40 MG tablet Take 1 tablet (40 mg total) by mouth daily. 90 tablet 3   No current facility-administered medications for this visit.    Socially he is single. He is retired. He works in [employer]. Completed 12th grade education. There is no tobacco history. He does drink occasional alcohol.  ROS General: Negative; No fevers, chills, or night sweats;  HEENT: Negative; No changes in vision or hearing, sinus congestion, difficulty swallowing Pulmonary: Negative; No cough, wheezing, shortness of breath, hemoptysis Cardiovascular: See history of present illness; No chest pain, presyncope, syncope, palpitations GI: Negative; No nausea, vomiting, diarrhea, or abdominal pain GU: Negative; No dysuria, hematuria, or difficulty voiding Musculoskeletal: Negative; no myalgias, joint pain, or weakness Hematologic/Oncology: Negative; no easy bruising, bleeding Endocrine: Negative; no heat/cold intolerance; no diabetes Neuro: Negative; no changes in balance, headaches Skin: Negative; No rashes or skin lesions Psychiatric: Negative; No behavioral problems, depression Sleep: Negative; No snoring, daytime sleepiness, hypersomnolence, bruxism, restless legs, hypnogognic hallucinations, no cataplexy Other comprehensive 14 point system  review is negative.   PE BP 158/96 mmHg  Pulse 71  Ht '5\' 6"'  (1.676 m)  Wt 183 lb 9.6 oz (83.28 kg)  BMI 29.65 kg/m2   Repeat blood pressure by me 146/82  Wt Readings from Last 3 Encounters:  11/18/15 183 lb 9.6 oz (83.28 kg)  10/10/15 180 lb (81.647 kg)  09/23/15 176 lb 1.6 oz (79.878 kg)   General: Alert, oriented, no distress.  Skin: normal turgor, no rashes HEENT: Normocephalic, atraumatic. Pupils round and reactive; sclera anicteric;no lid lag.  Nose without nasal septal hypertrophy Mouth/Parynx benign; Mallinpatti scale 3 Neck: No JVD, soft left carotid briut; normal carotid upstrokes Lungs: clear to ausculatation and percussion; no wheezing or rales Chest: No tenderness to palpation Heart: RRR, s1 s2 normal no S3 gallop. 1/6 systolic murmur. No S3 gallop or S4 gallop; no diastolic murmur rubs thrills or heaves. Abdomen: soft, nontender; no hepatosplenomehaly, BS+; abdominal aorta nontender and not dilated by palpation. Back: No CVA tenderness Pulses 2+ Extremities: no clubbing cyanosis or edema, Homan's sign negative  Neurologic: grossly nonfocal; cranial nerves grossly normal. Psychological: Normal affect and mood  ECG (independently read by me): Normal sinus rhythm  with first-degree AV block.  Mild T wave abnormality in lead 3  October ECG (independently read by me): normal sinus rhythm at 65 bpm..  Probable left posterior hemiblock.  Probably progression V1 through V4.  May 2016 ECG (independently read by me): Normal sinus rhythm at 75 bpm.  First-degree AV block with a PR interval 206 ms.  T-wave inversion in leads 3 and mildly in aVF.  September 2015 ECG (independently read by me): Normal sinus rhythm with one isolated PVC.  QTc interval 397 ms.  PR interval 190 ms.  Prior January 2015 ECG (independently read by me): Normal sinus rhythm at 60 beats per minute. Normal intervals.  Previous ECG from 12/04/2012 : Sinus bradycardia 53 beats per minute period. PR  interval 196; QTc interval 377 ms  LABS: BMP Latest Ref Rng 09/04/2015 09/03/2015 09/02/2015  Glucose 65 - 99 mg/dL 94 89 94  BUN 6 - 20 mg/dL '11 15 15  ' Creatinine 0.61 - 1.24 mg/dL 1.56(H) 1.61(H) 1.54(H)  Sodium 135 - 145 mmol/L 138 144 142  Potassium 3.5 - 5.1 mmol/L 3.6 3.5 3.7  Chloride 101 - 111 mmol/L 108 114(H) 110  CO2 22 - 32 mmol/L 22 18(L) 19(L)  Calcium 8.9 - 10.3 mg/dL 9.3 9.7 9.9   Hepatic Function Latest Ref Rng 09/02/2015 02/28/2015 09/28/2014  Total Protein 6.5 - 8.1 g/dL 7.2 7.1 7.2  Albumin 3.5 - 5.0 g/dL 3.6 3.7 3.8  AST 15 - 41 U/L 44(H) 37(H) 41(H)  ALT 17 - 63 U/L 34 31 32  Alk Phosphatase 38 - 126 U/L 75 87 79  Total Bilirubin 0.3 - 1.2 mg/dL 1.0 0.9 1.0   CBC Latest Ref Rng 09/04/2015 09/03/2015 09/02/2015  WBC 4.0 - 10.5 K/uL 5.5 6.9 7.2  Hemoglobin 13.0 - 17.0 g/dL 13.4 14.5 15.0  Hematocrit 39.0 - 52.0 % 39.6 42.1 43.0  Platelets 150 - 400 K/uL 189 209 241   Lab Results  Component Value Date   TSH 2.339 09/04/2015   Lipid Panel     Component Value Date/Time   CHOL 153 09/06/2015 0640   TRIG 122 09/06/2015 0640   HDL 33* 09/06/2015 0640   CHOLHDL 4.6 09/06/2015 0640   VLDL 24 09/06/2015 0640   LDLCALC 96 09/06/2015 0640   BNP    Component Value Date/Time   PROBNP 1662.0* 10/06/2011 0625     RADIOLOGY: No results found.    ASSESSMENT AND PLAN: Thomas Bullock is a 65 year old African-American male who has a long-standing history of hypertension and a history of a nonischemic cardiomyopathy who presented in pulmonary edema after he had stopped his blood pressure medications for several months in May 2013. Since that time on medical therapy his ejection fraction has improved from 20% to  60-65% as noted on his echo of 01/26/2014.  He has moderate dilatation of his ascending aorta.  Pulmonary pressures are normal.  He is unaware of any arrhythmia.  Presently, he is well compensated without overt CHF symptomatology.  He Developed vertigo and diplopia.   There was felt to have a TIA leading to his hospitalization.  At the end of April.  His most recent echo Doppler study again reconfirms normal systolic function with grade 1 diastolic dysfunction, mild to moderate AR.  At that time there was no evidence for any vegetation or cardiac source of emboli.  I reviewed his hospital records.  He was initially hypertensive on presentation today, but repeat blood pressure is improved at 146/80.  He will continue to  monitor his blood pressure and if blood pressure continues to be mildly elevated.  We will resume his previous ACE inhibition, but at lower dose, which apparently had been stopped.  He does have a very soft left carotid bruit on exam.  LDL is 96.  I have recommended further titration of Lipitor to 40 mg daily.  Follow-up blood work will be obtained.  I will see him in for six-month follow up evaluation or sooner if problems arise.    Time spent: 25 minutes  Troy Sine, MD, [facility]  11/23/2015 10:20 AM

## 2015-12-08 ENCOUNTER — Other Ambulatory Visit: Payer: Self-pay | Admitting: Cardiovascular Disease

## 2016-01-06 ENCOUNTER — Encounter: Payer: Self-pay | Admitting: Family Medicine

## 2016-01-06 ENCOUNTER — Ambulatory Visit: Payer: Medicare Other | Attending: Family Medicine | Admitting: Family Medicine

## 2016-01-06 VITALS — BP 150/86 | HR 66 | Temp 98.2°F | Resp 20 | Ht 66.0 in | Wt 185.0 lb

## 2016-01-06 DIAGNOSIS — N189 Chronic kidney disease, unspecified: Secondary | ICD-10-CM | POA: Diagnosis not present

## 2016-01-06 DIAGNOSIS — I420 Dilated cardiomyopathy: Secondary | ICD-10-CM | POA: Diagnosis not present

## 2016-01-06 DIAGNOSIS — G459 Transient cerebral ischemic attack, unspecified: Secondary | ICD-10-CM

## 2016-01-06 DIAGNOSIS — L918 Other hypertrophic disorders of the skin: Secondary | ICD-10-CM

## 2016-01-06 DIAGNOSIS — I10 Essential (primary) hypertension: Secondary | ICD-10-CM | POA: Diagnosis not present

## 2016-01-06 DIAGNOSIS — N183 Chronic kidney disease, stage 3 unspecified: Secondary | ICD-10-CM

## 2016-01-06 NOTE — Progress Notes (Signed)
Subjective:    Patient ID: Thomas LeavensSherman J Bullock, male    DOB: [birth year], [age] y.o.   MRN: [id number]  HPI He is a 65 year old male with a history of hypertension,Nonischemic cardiomyopathy, CHF (EF 55-60%, grade 1 diastolic dysfunction from 09/2015), stage III chronic kidney disease, TIA after he had presented with weakness and diplopia but imaging was negative for stroke (CT head negative for acute process, MRI brain revealing distal right vertebral artery abnormality which may reflect diminished flow from proximal stenosis or occlusion).   His diplopia was being managed by ophthalmology and he reports improvement in symptoms. He remains on Travatan eyedrops at this time. His last visit to cardiology was in 11/2015 where his Lipitor dose was increased to 40 mg daily and a follow-up visit in 6 months recommended along with fasting labs which are due this month.  Denies weakness of any extremity numbness; he does have some stuttering which he states he has always had. He last saw his neurologist in June/2017.  Today he is concerned about skin tags around his neck which he would like to have removed.   Past Medical History:  Diagnosis Date  . Acute pulmonary edema (HCC) 10/04/2011  . Coronary artery disease   . GERD (gastroesophageal reflux disease) 10/04/2011  . Hypertension   . LVH (left ventricular hypertrophy) 10/04/2011  . Systolic CHF (HCC) 11/07/2011   Hospitalized for flash pulmonary edema in June 2012 due to malignant HTN.   EF was 20-25% with severe global hypokinesis. left heart cath which revealed an LAD with 20% smooth narrowing but otherwise normal coronaries. On ace inhibitor, spironalactone, carvedilol. Diuresed in hospital.  Life Vest was ordered and fitted by [employer] to wear until September.       Past Surgical History:  Procedure Laterality Date  . CHOLECYSTECTOMY    . CHOLECYSTECTOMY, LAPAROSCOPIC  2006  . DENTAL SURGERY    . LEFT HEART CATHETERIZATION WITH CORONARY ANGIOGRAM  N/A 10/05/2011   Procedure: LEFT HEART CATHETERIZATION WITH CORONARY ANGIOGRAM;  Surgeon: Lennette Biharihomas A Kelly, MD;  [facility];  Service: Cardiovascular;  Laterality: N/A;  . RIGHT HEART CATHETERIZATION  10/05/2011   Procedure: RIGHT HEART CATH;  Surgeon: Lennette Biharihomas A Kelly, MD;  [facility];  Service: Cardiovascular;;    No Known Allergies    Review of Systems  Constitutional: Negative for activity change and appetite change.  HENT: Negative for sinus pressure and sore throat.   Eyes: Negative for visual disturbance.  Respiratory: Negative for cough, chest tightness and shortness of breath.   Cardiovascular: Negative for chest pain and leg swelling.  Gastrointestinal: Negative for abdominal distention, abdominal pain, constipation and diarrhea.  Endocrine: Negative.   Genitourinary: Negative for dysuria.  Musculoskeletal: Negative for joint swelling and myalgias.  Skin: Negative for rash.       See hpi  Allergic/Immunologic: Negative.   Neurological: Negative for weakness, light-headedness and numbness.  Psychiatric/Behavioral: Negative for dysphoric mood and suicidal ideas.       Objective: Vitals:   01/06/16 1013  BP: (!) 150/86  Pulse: 66  Resp: 20  Temp: 98.2 F (36.8 C)  TempSrc: Oral  SpO2: 98%  Weight: 185 lb (83.9 kg)  Height: 5\' 6"  (1.676 m)      Physical Exam Constitutional: He is oriented to person, place, and time. He appears well-developed and well-nourished.  Cardiovascular: Normal rate, normal heart sounds and intact distal pulses.   No murmur heard. Pulmonary/Chest: Effort normal and breath sounds normal. He has  no wheezes. He has no rales. He exhibits no tenderness.  Abdominal: Soft. Bowel sounds are normal. He exhibits no distension and no mass. There is no tenderness.  Musculoskeletal: Normal range of motion.  Neurological: He is alert and oriented to person, place, and time.  stuttering speech  Skin: Multiple skin tags on neck   CMP  Latest Ref Rng & Units 09/04/2015 09/03/2015 09/02/2015  Glucose 65 - 99 mg/dL 94 89 94  BUN 6 - 20 mg/dL 11 15 15   Creatinine 0.61 - 1.24 mg/dL 7.86(V) 6.72(C) 9.47(S)  Sodium 135 - 145 mmol/L 138 144 142  Potassium 3.5 - 5.1 mmol/L 3.6 3.5 3.7  Chloride 101 - 111 mmol/L 108 114(H) 110  CO2 22 - 32 mmol/L 22 18(L) 19(L)  Calcium 8.9 - 10.3 mg/dL 9.3 9.7 9.9  Total Protein 6.5 - 8.1 g/dL - - -  Total Bilirubin 0.3 - 1.2 mg/dL - - -  Alkaline Phos 38 - 126 U/L - - -  AST 15 - 41 U/L - - -  ALT 17 - 63 U/L - - -       Assessment & Plan:  1. Essential hypertension BP is above goal of less than 140/90 Blood pressure was controlled at last visit No regimen changes Continue current medications. Low-sodium, DASH diet He refuses to have blood work done in the clinic here but would rather have it done at [facility].  2. Nonischemic cardiomyopathy (HCC) Aggressive risk factor modification Currently on a beta blocker, statin ; not on ACE inhibitor due to CKD Scheduled to see his cardiologist- Dr Tresa Endo  3. Chronic renal insufficiency, stage III (moderate) Stable Avoid nephrotoxic agents We'll monitor renal function  4. Transient cerebral ischemia, unspecified transient cerebral ischemia type Scheduled deficits Advised to keep follow-up appointment with [facility] neurology  5. Diplopia Resolved Continue Travatan Under the care of ophthalmology  6. Skin tag Refer to dermatology

## 2016-01-06 NOTE — Progress Notes (Signed)
F/u htn, dizziness occasionally. Doctor Tresa Endo gave orders for labs in September: cmp/lipid

## 2016-01-06 NOTE — Patient Instructions (Signed)
Hypertension Hypertension, commonly called high blood pressure, is when the force of blood pumping through your arteries is too strong. Your arteries are the blood vessels that carry blood from your heart throughout your body. A blood pressure reading consists of a higher number over a lower number, such as 110/72. The higher number (systolic) is the pressure inside your arteries when your heart pumps. The lower number (diastolic) is the pressure inside your arteries when your heart relaxes. Ideally you want your blood pressure below 120/80. Hypertension forces your heart to work harder to pump blood. Your arteries may become narrow or stiff. Having untreated or uncontrolled hypertension can cause heart attack, stroke, kidney disease, and other problems. RISK FACTORS Some risk factors for high blood pressure are controllable. Others are not.  Risk factors you cannot control include:   Race. You may be at higher risk if you are African American.  Age. Risk increases with age.  Gender. Men are at higher risk than women before age 45 years. After age 65, women are at higher risk than men. Risk factors you can control include:  Not getting enough exercise or physical activity.  Being overweight.  Getting too much fat, sugar, calories, or salt in your diet.  Drinking too much alcohol. SIGNS AND SYMPTOMS Hypertension does not usually cause signs or symptoms. Extremely high blood pressure (hypertensive crisis) may cause headache, anxiety, shortness of breath, and nosebleed. DIAGNOSIS To check if you have hypertension, your health care provider will measure your blood pressure while you are seated, with your arm held at the level of your heart. It should be measured at least twice using the same arm. Certain conditions can cause a difference in blood pressure between your right and left arms. A blood pressure reading that is higher than normal on one occasion does not mean that you need treatment. If  it is not clear whether you have high blood pressure, you may be asked to return on a different day to have your blood pressure checked again. Or, you may be asked to monitor your blood pressure at home for 1 or more weeks. TREATMENT Treating high blood pressure includes making lifestyle changes and possibly taking medicine. Living a healthy lifestyle can help lower high blood pressure. You may need to change some of your habits. Lifestyle changes may include:  Following the DASH diet. This diet is high in fruits, vegetables, and whole grains. It is low in salt, red meat, and added sugars.  Keep your sodium intake below 2,300 mg per day.  Getting at least 30-45 minutes of aerobic exercise at least 4 times per week.  Losing weight if necessary.  Not smoking.  Limiting alcoholic beverages.  Learning ways to reduce stress. Your health care provider may prescribe medicine if lifestyle changes are not enough to get your blood pressure under control, and if one of the following is true:  You are 18-59 years of age and your systolic blood pressure is above 140.  You are 60 years of age or older, and your systolic blood pressure is above 150.  Your diastolic blood pressure is above 90.  You have diabetes, and your systolic blood pressure is over 140 or your diastolic blood pressure is over 90.  You have kidney disease and your blood pressure is above 140/90.  You have heart disease and your blood pressure is above 140/90. Your personal target blood pressure may vary depending on your medical conditions, your age, and other factors. HOME CARE INSTRUCTIONS    Have your blood pressure rechecked as directed by your health care provider.   Take medicines only as directed by your health care provider. Follow the directions carefully. Blood pressure medicines must be taken as prescribed. The medicine does not work as well when you skip doses. Skipping doses also puts you at risk for  problems.  Do not smoke.   Monitor your blood pressure at home as directed by your health care provider. SEEK MEDICAL CARE IF:   You think you are having a reaction to medicines taken.  You have recurrent headaches or feel dizzy.  You have swelling in your ankles.  You have trouble with your vision. SEEK IMMEDIATE MEDICAL CARE IF:  You develop a severe headache or confusion.  You have unusual weakness, numbness, or feel faint.  You have severe chest or abdominal pain.  You vomit repeatedly.  You have trouble breathing. MAKE SURE YOU:   Understand these instructions.  Will watch your condition.  Will get help right away if you are not doing well or get worse.   This information is not intended to replace advice given to you by your health care provider. Make sure you discuss any questions you have with your health care provider.   Document Released: 04/23/2005 Document Revised: 09/07/2014 Document Reviewed: 02/13/2013 Elsevier Interactive Patient Education 2016 [employer]vier Inc.  

## 2016-01-09 ENCOUNTER — Other Ambulatory Visit: Payer: Self-pay | Admitting: Cardiovascular Disease

## 2016-01-10 ENCOUNTER — Other Ambulatory Visit: Payer: Self-pay | Admitting: Cardiovascular Disease

## 2016-01-10 ENCOUNTER — Telehealth: Payer: Self-pay | Admitting: Cardiovascular Disease

## 2016-01-10 MED ORDER — ISOSORBIDE DINITRATE 20 MG PO TABS: 20.0000 mg | ORAL_TABLET | Freq: Three times a day (TID) | ORAL | 1 refills | Status: DC

## 2016-01-10 NOTE — Telephone Encounter (Signed)
New Message   *STAT* If patient is at the pharmacy, call can be transferred to refill team.   1. Which medications need to be refilled? (please list name of each medication and dose if known) isosorbide dinitrate 20 mg tablet three times daily  2. Which pharmacy/location (including street and city if local pharmacy) is medication to be sent to? [facility] [phone], [phone]  3. Do they need a 30 day or 90 day supply? 90 day

## 2016-01-10 NOTE — Telephone Encounter (Signed)
Rx(s) sent to pharmacy electronically.  

## 2016-01-13 DIAGNOSIS — I10 Essential (primary) hypertension: Secondary | ICD-10-CM | POA: Diagnosis not present

## 2016-01-13 DIAGNOSIS — Z79899 Other long term (current) drug therapy: Secondary | ICD-10-CM | POA: Diagnosis not present

## 2016-01-14 LAB — COMPREHENSIVE METABOLIC PANEL
ALK PHOS: 74 U/L (ref 40–115)
ALT: 56 U/L — AB (ref 9–46)
AST: 75 U/L — AB (ref 10–35)
Albumin: 3.9 g/dL (ref 3.6–5.1)
BILIRUBIN TOTAL: 0.8 mg/dL (ref 0.2–1.2)
BUN: 15 mg/dL (ref 7–25)
CALCIUM: 9.8 mg/dL (ref 8.6–10.3)
CO2: 21 mmol/L (ref 20–31)
Chloride: 102 mmol/L (ref 98–110)
Creat: 1.4 mg/dL — ABNORMAL HIGH (ref 0.70–1.25)
Glucose, Bld: 89 mg/dL (ref 65–99)
POTASSIUM: 4.2 mmol/L (ref 3.5–5.3)
Sodium: 137 mmol/L (ref 135–146)
TOTAL PROTEIN: 7.8 g/dL (ref 6.1–8.1)

## 2016-01-14 LAB — LIPID PANEL
CHOL/HDL RATIO: 2.5 ratio (ref <=5.0)
Cholesterol: 109 mg/dL — ABNORMAL LOW (ref 125–200)
HDL: 43 mg/dL (ref >=40)
LDL Cholesterol: 48 mg/dL (ref <130)
Triglycerides: 89 mg/dL (ref <150)
VLDL: 18 mg/dL (ref <30)

## 2016-01-19 ENCOUNTER — Other Ambulatory Visit: Payer: Self-pay

## 2016-01-19 DIAGNOSIS — R748 Abnormal levels of other serum enzymes: Secondary | ICD-10-CM

## 2016-01-26 ENCOUNTER — Telehealth: Payer: Self-pay | Admitting: Cardiovascular Disease

## 2016-01-26 NOTE — Telephone Encounter (Signed)
New message   Pt verbalized that he is calling for lab work results

## 2016-01-26 NOTE — Telephone Encounter (Signed)
Returned call to patient-wondering why he has to have repeat blood work when he just had it earlier this month.  Advised he needed repeat blood work after medication changes made on 9/14.  Verbalized understanding.

## 2016-02-20 DIAGNOSIS — R748 Abnormal levels of other serum enzymes: Secondary | ICD-10-CM | POA: Diagnosis not present

## 2016-02-21 LAB — HEPATIC FUNCTION PANEL
ALBUMIN: 3.7 g/dL (ref 3.6–5.1)
ALT: 47 U/L — ABNORMAL HIGH (ref 9–46)
AST: 55 U/L — AB (ref 10–35)
Alkaline Phosphatase: 70 U/L (ref 40–115)
BILIRUBIN TOTAL: 0.7 mg/dL (ref 0.2–1.2)
Bilirubin, Direct: 0.2 mg/dL (ref <=0.2)
Indirect Bilirubin: 0.5 mg/dL (ref 0.2–1.2)
Total Protein: 7.4 g/dL (ref 6.1–8.1)

## 2016-02-29 ENCOUNTER — Telehealth: Payer: Self-pay | Admitting: *Deleted

## 2016-02-29 ENCOUNTER — Other Ambulatory Visit: Payer: Self-pay | Admitting: *Deleted

## 2016-02-29 DIAGNOSIS — R945 Abnormal results of liver function studies: Principal | ICD-10-CM

## 2016-02-29 DIAGNOSIS — R7989 Other specified abnormal findings of blood chemistry: Secondary | ICD-10-CM

## 2016-02-29 NOTE — Telephone Encounter (Signed)
Called to inform patient of lab results and recommendations. Dr Tresa Endo was notified that patient is already taking 20 mg of atorvastatin. It was cut back after the last blood draw, per Dr Landry Dyke instructions patient was advised to continue current dose. Recheck hepatic function in 3-4 weeks. Will re-assess at that time. Patient voiced understanding.

## 2016-02-29 NOTE — Patient Instructions (Signed)
Lab order to have hepatic function drawn mailed to patient per V.O from Dr Tresa Endo.

## 2016-02-29 NOTE — Telephone Encounter (Signed)
-----   Message from Lennette Bihari, MD sent at 02/26/2016 11:26 AM EDT ----- LFTs better but slightly increased;  Decrease lipitor back to 20 mg;  Recheck lipids and lfts in 6 -8 weeks

## 2016-03-12 ENCOUNTER — Other Ambulatory Visit: Payer: Self-pay | Admitting: Cardiovascular Disease

## 2016-03-12 NOTE — Telephone Encounter (Signed)
Rx request sent to pharmacy.  

## 2016-03-13 DIAGNOSIS — H532 Diplopia: Secondary | ICD-10-CM | POA: Diagnosis not present

## 2016-03-13 DIAGNOSIS — H401132 Primary open-angle glaucoma, bilateral, moderate stage: Secondary | ICD-10-CM | POA: Diagnosis not present

## 2016-03-13 DIAGNOSIS — H2513 Age-related nuclear cataract, bilateral: Secondary | ICD-10-CM | POA: Diagnosis not present

## 2016-03-19 ENCOUNTER — Telehealth: Payer: Self-pay | Admitting: Cardiovascular Disease

## 2016-03-19 DIAGNOSIS — R7989 Other specified abnormal findings of blood chemistry: Secondary | ICD-10-CM | POA: Diagnosis not present

## 2016-03-19 NOTE — Telephone Encounter (Signed)
Returned call. Pt requesting advise on when to do repeat LFT. Advised OK based on previous note to do so anytime midweek this week to next week. Pt voiced understanding and thanks. No further questions at this time. Aware to call if further concerns.

## 2016-03-19 NOTE — Telephone Encounter (Signed)
New Message  Pt voiced wanting nurse to return his call about his lab work.  Please f/u

## 2016-03-20 LAB — HEPATIC FUNCTION PANEL
ALBUMIN: 3.8 g/dL (ref 3.6–5.1)
ALT: 39 U/L (ref 9–46)
AST: 53 U/L — AB (ref 10–35)
Alkaline Phosphatase: 70 U/L (ref 40–115)
BILIRUBIN TOTAL: 0.7 mg/dL (ref 0.2–1.2)
Bilirubin, Direct: 0.3 mg/dL — ABNORMAL HIGH (ref <=0.2)
Indirect Bilirubin: 0.4 mg/dL (ref 0.2–1.2)
Total Protein: 7.4 g/dL (ref 6.1–8.1)

## 2016-04-11 ENCOUNTER — Telehealth: Payer: Self-pay | Admitting: Cardiovascular Disease

## 2016-04-11 NOTE — Telephone Encounter (Signed)
Please review and follow up w patient.

## 2016-04-11 NOTE — Telephone Encounter (Signed)
From initial LFT and Lipid results in October: Notes Recorded by Gaynelle Cage, CMA on 02/29/2016 at 2:15 PM EDT Patient notified. See 02/29/16 phone note. ------  Notes Recorded by Lennette Bihari, MD on 02/26/2016 at 11:26 AM EDT LFTs better but slightly increased;  Decrease lipitor back to 20 mg;  Recheck lipids and lfts in 6 -8 weeks ------  Patient actually calling about the follow up LFT test, which resulted on 11/13.  This is what he needs information on. He needs to know if he should continue 20mg  (1/2 tablet) or if it's OK to move up to 40mg . I advised to continue 20mg  until Dr. Tresa Endo reviews November results.  Routed to Dr. Tresa Endo to advise.

## 2016-04-11 NOTE — Telephone Encounter (Signed)
See lab comments. Patient has already been notified.

## 2016-04-11 NOTE — Telephone Encounter (Signed)
New Message  Pt call requesting to speak with RN about getting the results from his lab work.  Pt also would like to know if he needs to continue taking lipitor 40mg . Please call back to discuss

## 2016-04-16 ENCOUNTER — Encounter: Payer: Self-pay | Admitting: Family Medicine

## 2016-04-16 ENCOUNTER — Ambulatory Visit: Payer: Medicare Other | Attending: Family Medicine | Admitting: Family Medicine

## 2016-04-16 VITALS — BP 110/65 | HR 82 | Temp 97.3°F | Ht 66.0 in | Wt 179.8 lb

## 2016-04-16 DIAGNOSIS — H532 Diplopia: Secondary | ICD-10-CM

## 2016-04-16 DIAGNOSIS — Z8673 Personal history of transient ischemic attack (TIA), and cerebral infarction without residual deficits: Secondary | ICD-10-CM | POA: Diagnosis not present

## 2016-04-16 DIAGNOSIS — I428 Other cardiomyopathies: Secondary | ICD-10-CM

## 2016-04-16 DIAGNOSIS — K219 Gastro-esophageal reflux disease without esophagitis: Secondary | ICD-10-CM | POA: Diagnosis not present

## 2016-04-16 DIAGNOSIS — I130 Hypertensive heart and chronic kidney disease with heart failure and stage 1 through stage 4 chronic kidney disease, or unspecified chronic kidney disease: Secondary | ICD-10-CM | POA: Insufficient documentation

## 2016-04-16 DIAGNOSIS — I5032 Chronic diastolic (congestive) heart failure: Secondary | ICD-10-CM | POA: Diagnosis not present

## 2016-04-16 DIAGNOSIS — I429 Cardiomyopathy, unspecified: Secondary | ICD-10-CM | POA: Insufficient documentation

## 2016-04-16 DIAGNOSIS — N183 Chronic kidney disease, stage 3 unspecified: Secondary | ICD-10-CM

## 2016-04-16 DIAGNOSIS — G459 Transient cerebral ischemic attack, unspecified: Secondary | ICD-10-CM | POA: Diagnosis not present

## 2016-04-16 DIAGNOSIS — Z9049 Acquired absence of other specified parts of digestive tract: Secondary | ICD-10-CM | POA: Diagnosis not present

## 2016-04-16 DIAGNOSIS — Z7982 Long term (current) use of aspirin: Secondary | ICD-10-CM | POA: Diagnosis not present

## 2016-04-16 DIAGNOSIS — I2510 Atherosclerotic heart disease of native coronary artery without angina pectoris: Secondary | ICD-10-CM | POA: Diagnosis not present

## 2016-04-16 DIAGNOSIS — I10 Essential (primary) hypertension: Secondary | ICD-10-CM | POA: Diagnosis not present

## 2016-04-16 NOTE — Patient Instructions (Signed)
Hypertension Hypertension, commonly called high blood pressure, is when the force of blood pumping through your arteries is too strong. Your arteries are the blood vessels that carry blood from your heart throughout your body. A blood pressure reading consists of a higher number over a lower number, such as 110/72. The higher number (systolic) is the pressure inside your arteries when your heart pumps. The lower number (diastolic) is the pressure inside your arteries when your heart relaxes. Ideally you want your blood pressure below 120/80. Hypertension forces your heart to work harder to pump blood. Your arteries may become narrow or stiff. Having untreated or uncontrolled hypertension can cause heart attack, stroke, kidney disease, and other problems. What increases the risk? Some risk factors for high blood pressure are controllable. Others are not. Risk factors you cannot control include:  Race. You may be at higher risk if you are African American.  Age. Risk increases with age.  Gender. Men are at higher risk than women before age 45 years. After age 65, women are at higher risk than men. Risk factors you can control include:  Not getting enough exercise or physical activity.  Being overweight.  Getting too much fat, sugar, calories, or salt in your diet.  Drinking too much alcohol. What are the signs or symptoms? Hypertension does not usually cause signs or symptoms. Extremely high blood pressure (hypertensive crisis) may cause headache, anxiety, shortness of breath, and nosebleed. How is this diagnosed? To check if you have hypertension, your health care provider will measure your blood pressure while you are seated, with your arm held at the level of your heart. It should be measured at least twice using the same arm. Certain conditions can cause a difference in blood pressure between your right and left arms. A blood pressure reading that is higher than normal on one occasion does  not mean that you need treatment. If it is not clear whether you have high blood pressure, you may be asked to return on a different day to have your blood pressure checked again. Or, you may be asked to monitor your blood pressure at home for 1 or more weeks. How is this treated? Treating high blood pressure includes making lifestyle changes and possibly taking medicine. Living a healthy lifestyle can help lower high blood pressure. You may need to change some of your habits. Lifestyle changes may include:  Following the DASH diet. This diet is high in fruits, vegetables, and whole grains. It is low in salt, red meat, and added sugars.  Keep your sodium intake below 2,300 mg per day.  Getting at least 30-45 minutes of aerobic exercise at least 4 times per week.  Losing weight if necessary.  Not smoking.  Limiting alcoholic beverages.  Learning ways to reduce stress. Your health care provider may prescribe medicine if lifestyle changes are not enough to get your blood pressure under control, and if one of the following is true:  You are 18-59 years of age and your systolic blood pressure is above 140.  You are 60 years of age or older, and your systolic blood pressure is above 150.  Your diastolic blood pressure is above 90.  You have diabetes, and your systolic blood pressure is over 140 or your diastolic blood pressure is over 90.  You have kidney disease and your blood pressure is above 140/90.  You have heart disease and your blood pressure is above 140/90. Your personal target blood pressure may vary depending on your medical   conditions, your age, and other factors. Follow these instructions at home:  Have your blood pressure rechecked as directed by your health care provider.  Take medicines only as directed by your health care provider. Follow the directions carefully. Blood pressure medicines must be taken as prescribed. The medicine does not work as well when you skip  doses. Skipping doses also puts you at risk for problems.  Do not smoke.  Monitor your blood pressure at home as directed by your health care provider. Contact a health care provider if:  You think you are having a reaction to medicines taken.  You have recurrent headaches or feel dizzy.  You have swelling in your ankles.  You have trouble with your vision. Get help right away if:  You develop a severe headache or confusion.  You have unusual weakness, numbness, or feel faint.  You have severe chest or abdominal pain.  You vomit repeatedly.  You have trouble breathing. This information is not intended to replace advice given to you by your health care provider. Make sure you discuss any questions you have with your health care provider. Document Released: 04/23/2005 Document Revised: 09/29/2015 Document Reviewed: 02/13/2013 Elsevier Interactive Patient Education  2017 [employer].  

## 2016-04-16 NOTE — Progress Notes (Signed)
Subjective:    Patient ID: Thomas Bullock, male    DOB: 1950/11/11, 65 y.o.   MRN: [id number]  HPI He is a 65 year old male with a history of hypertension,Nonischemic cardiomyopathy, CHF (EF 55-60%, grade 1 diastolic dysfunction from 09/2015), stage III chronic kidney disease, TIA after he had presented with weakness and diplopia but imaging was negative for stroke (CT head negative for acute process, MRI brain revealing distal right vertebral artery abnormality which may reflect diminished flow from proximal stenosis or occlusion).   His diplopia is intermittent and is being managed by ophthalmology and he reports improvement in symptoms. He remains on Travatan eyedrops at this time. He is also followed closely by cardiology.   At his last office visit his blood pressure was elevated and no regimen changes were made but he was encouraged to continue with low-sodium, DASH diet. Blood pressure is normal today.  Denies weakness of any extremity or numbness; he does have some stuttering which he states he has always had. He last saw his neurologist in June/2017.  He has no acute concerns at this time.  Past Medical History:  Diagnosis Date  . Acute pulmonary edema (HCC) 10/04/2011  . Coronary artery disease   . GERD (gastroesophageal reflux disease) 10/04/2011  . Hypertension   . LVH (left ventricular hypertrophy) 10/04/2011  . Systolic CHF (HCC) 11/07/2011   Hospitalized for flash pulmonary edema in June 2012 due to malignant HTN.   EF was 20-25% with severe global hypokinesis. left heart cath which revealed an LAD with 20% smooth narrowing but otherwise normal coronaries. On ace inhibitor, spironalactone, carvedilol. Diuresed in hospital.  Life Vest was ordered and fitted by [employer] to wear until September.       Past Surgical History:  Procedure Laterality Date  . CHOLECYSTECTOMY    . CHOLECYSTECTOMY, LAPAROSCOPIC  2006  . DENTAL SURGERY    . LEFT HEART CATHETERIZATION WITH CORONARY  ANGIOGRAM N/A 10/05/2011   Procedure: LEFT HEART CATHETERIZATION WITH CORONARY ANGIOGRAM;  Surgeon: Lennette Bihari, MD;  Location: [facility] CATH LAB;  Service: Cardiovascular;  Laterality: N/A;  . RIGHT HEART CATHETERIZATION  10/05/2011   Procedure: RIGHT HEART CATH;  Surgeon: Lennette Bihari, MD;  Location: [facility] CATH LAB;  Service: Cardiovascular;;    No Known Allergies  Current Outpatient Prescriptions on File Prior to Visit  Medication Sig Dispense Refill  . aspirin EC 325 MG EC tablet Take 1 tablet (325 mg total) by mouth daily. 30 tablet 2  . atorvastatin (LIPITOR) 40 MG tablet Take 1/2 tablet ( 20 mg ) daily 30 tablet 6  . carvedilol (COREG) 12.5 MG tablet TAKE ONE TABLET BY MOUTH TWICE DAILY WITH A MEAL 60 tablet 5  . digoxin (LANOXIN) 0.125 MG tablet TAKE ONE-HALF TABLET BY MOUTH ONCE DAILY 15 tablet 11  . furosemide (LASIX) 40 MG tablet TAKE ONE TABLET BY MOUTH ONCE DAILY 30 tablet 5  . hydrALAZINE (APRESOLINE) 25 MG tablet TAKE ONE AND ONE-HALF TABLETS BY MOUTH THREE TIMES DAILY 135 tablet 6  . isosorbide dinitrate (ISORDIL) 20 MG tablet Take 1 tablet (20 mg total) by mouth 3 (three) times daily. 270 tablet 1  . pantoprazole (PROTONIX) 40 MG tablet TAKE ONE TABLET BY MOUTH ONCE DAILY 30 tablet 6  . spironolactone (ALDACTONE) 25 MG tablet TAKE ONE-HALF TABLET BY MOUTH TWICE DAILY 30 tablet 11  . TRAVATAN Z 0.004 % SOLN ophthalmic solution      No current facility-administered medications on file prior to visit.  Review of Systems Constitutional: Negative for activity change and appetite change.  HENT: Negative for sinus pressure and sore throat.   Eyes: Negative for visual disturbance.  Respiratory: Negative for cough, chest tightness and shortness of breath.   Cardiovascular: Negative for chest pain and leg swelling.  Gastrointestinal: Negative for abdominal distention, abdominal pain, constipation and diarrhea.  Endocrine: Negative.   Genitourinary: Negative for dysuria.    Musculoskeletal: Negative for joint swelling and myalgias.  Skin: Negative for rash.       Allergic/Immunologic: Negative.   Neurological: Negative for weakness, light-headedness and numbness.  Psychiatric/Behavioral: Negative for dysphoric mood and suicidal ideas.      Objective: Vitals:   04/16/16 0914  BP: 110/65  Pulse: 82  Temp: 97.3 F (36.3 C)  TempSrc: Oral  SpO2: 98%  Weight: 179 lb 12.8 oz (81.6 kg)  Height: 5\' 6"  (1.676 m)      Physical Exam Constitutional: He is oriented to person, place, and time. He appears well-developed and well-nourished.  Cardiovascular: Normal rate, normal heart sounds and intact distal pulses.   No murmur heard. Pulmonary/Chest: Effort normal and breath sounds normal. He has no wheezes. He has no rales. He exhibits no tenderness.  Abdominal: Soft. Bowel sounds are normal. He exhibits no distension and no mass. There is no tenderness.  Musculoskeletal: Normal range of motion.  Neurological: He is alert and oriented to person, place, and time.  stuttering speech  Psych: Normal mood and affect.      CMP Latest Ref Rng & Units 03/19/2016 02/20/2016 01/13/2016  Glucose 65 - 99 mg/dL - - 89  BUN 7 - 25 mg/dL - - 15  Creatinine 9.620.70 - 1.25 mg/dL - - 9.52(W1.40(H)  Sodium 413135 - 146 mmol/L - - 137  Potassium 3.5 - 5.3 mmol/L - - 4.2  Chloride 98 - 110 mmol/L - - 102  CO2 20 - 31 mmol/L - - 21  Calcium 8.6 - 10.3 mg/dL - - 9.8  Total Protein 6.1 - 8.1 g/dL 7.4 7.4 7.8  Total Bilirubin 0.2 - 1.2 mg/dL 0.7 0.7 0.8  Alkaline Phos 40 - 115 U/L 70 70 74  AST 10 - 35 U/L 53(H) 55(H) 75(H)  ALT 9 - 46 U/L 39 47(H) 56(H)     Assessment & Plan:  1. Essential hypertension Controlled Continue current medications. Low-sodium, DASH diet   2. Nonischemic cardiomyopathy (HCC) Aggressive risk factor modification Currently on a beta blocker, statin ; not on ACE inhibitor due to CKD Follow up with cardiologist- Dr Tresa EndoKelly  3. Chronic renal insufficiency,  stage III (moderate) Stable Avoid nephrotoxic agents We'll monitor renal function, CMET ordered  4. Transient cerebral ischemia, unspecified transient cerebral ischemia type Stable Ad[facility]  5. Diplopia Intermittent Continue Travatan Under the care of ophthalmology  This note has been created with Dragon speech recognition software and Paediatric nursesmart phrase technology. Any transcriptional errors are unintentional.

## 2016-04-17 ENCOUNTER — Ambulatory Visit: Payer: Medicare Other | Attending: Internal Medicine

## 2016-04-17 DIAGNOSIS — N183 Chronic kidney disease, stage 3 unspecified: Secondary | ICD-10-CM

## 2016-04-17 NOTE — Addendum Note (Signed)
Addended by: Paschal Dopp on: 04/17/2016 09:07 AM   Modules accepted: Orders

## 2016-04-17 NOTE — Progress Notes (Signed)
Patient here for lab visit only 

## 2016-04-18 LAB — COMPLETE METABOLIC PANEL WITH GFR
ALT: 37 U/L (ref 9–46)
AST: 49 U/L — AB (ref 10–35)
Albumin: 3.5 g/dL — ABNORMAL LOW (ref 3.6–5.1)
Alkaline Phosphatase: 74 U/L (ref 40–115)
BUN: 15 mg/dL (ref 7–25)
CHLORIDE: 104 mmol/L (ref 98–110)
CO2: 25 mmol/L (ref 20–31)
Calcium: 9.3 mg/dL (ref 8.6–10.3)
Creat: 1.39 mg/dL — ABNORMAL HIGH (ref 0.70–1.25)
GFR, Est African American: 61 mL/min (ref >=60)
GFR, Est Non African American: 53 mL/min — ABNORMAL LOW (ref >=60)
GLUCOSE: 91 mg/dL (ref 65–99)
POTASSIUM: 4.3 mmol/L (ref 3.5–5.3)
SODIUM: 138 mmol/L (ref 135–146)
Total Bilirubin: 0.8 mg/dL (ref 0.2–1.2)
Total Protein: 7 g/dL (ref 6.1–8.1)

## 2016-04-19 ENCOUNTER — Telehealth: Payer: Self-pay

## 2016-04-19 NOTE — Telephone Encounter (Signed)
-----   Message from Jaclyn Shaggy, MD sent at 04/18/2016  1:53 PM EST ----- Labs are stable

## 2016-04-19 NOTE — Telephone Encounter (Signed)
Writer called patient per Dr. Amao to discuss lab results.  Patient stated understanding.  

## 2016-05-31 ENCOUNTER — Ambulatory Visit (INDEPENDENT_AMBULATORY_CARE_PROVIDER_SITE_OTHER): Payer: Medicare Other | Admitting: Cardiovascular Disease

## 2016-05-31 VITALS — BP 140/86 | HR 60 | Ht 66.0 in | Wt 182.4 lb

## 2016-05-31 DIAGNOSIS — N183 Chronic kidney disease, stage 3 unspecified: Secondary | ICD-10-CM

## 2016-05-31 DIAGNOSIS — E785 Hyperlipidemia, unspecified: Secondary | ICD-10-CM

## 2016-05-31 DIAGNOSIS — K219 Gastro-esophageal reflux disease without esophagitis: Secondary | ICD-10-CM

## 2016-05-31 DIAGNOSIS — I428 Other cardiomyopathies: Secondary | ICD-10-CM

## 2016-05-31 DIAGNOSIS — Z79899 Other long term (current) drug therapy: Secondary | ICD-10-CM

## 2016-05-31 DIAGNOSIS — I110 Hypertensive heart disease with heart failure: Secondary | ICD-10-CM

## 2016-05-31 DIAGNOSIS — I10 Essential (primary) hypertension: Secondary | ICD-10-CM | POA: Diagnosis not present

## 2016-05-31 MED ORDER — ASPIRIN EC 81 MG PO TBEC: 81.0000 mg | DELAYED_RELEASE_TABLET | Freq: Every day | ORAL | 3 refills | Status: AC

## 2016-05-31 MED ORDER — FUROSEMIDE 40 MG PO TABS: 20.0000 mg | ORAL_TABLET | Freq: Every day | ORAL | 5 refills | Status: DC

## 2016-05-31 MED ORDER — SPIRONOLACTONE 25 MG PO TABS: 25.0000 mg | ORAL_TABLET | Freq: Two times a day (BID) | ORAL | 11 refills | Status: DC

## 2016-05-31 NOTE — Patient Instructions (Signed)
Your physician has recommended you make the following change in your medication:   1.) the Aspirin has been decreased from 325 mg to 81 mg daily.  2.) the spironolactone has been increased to 1 tablet twice a day. ( 25 mg)  3.) the furosemide has been decreased to 1/2 tablet daily. ( 20 mg)  Your physician recommends that you return for lab work in: 2 weeks.  Your physician recommends that you schedule a follow-up appointment in: 4 months.

## 2016-06-01 ENCOUNTER — Encounter: Payer: Self-pay | Admitting: Cardiovascular Disease

## 2016-06-01 NOTE — Progress Notes (Signed)
Patient ID: ORION MOLE, male   DOB: [birth year], 66 y.o.   MRN: [id number]    Primary MD: Dr. Jarold Song  HPI: VIDUR KNUST is a 66 y.o. male with a history of a nonischemic cardiomyopathy who presents to the office for an 20-monthcardiology evaluation.  Mr. CBeamera long-standing history of hypertension. In May 2013 after being off his medications for some time [facility]. He was found to have a nonischemic cardiomyopathy with an ejection fraction of 20-25% and minimal CAD with 20% narrowing in the LAD noted at catheterization.With aggressive medical therapy and gradual titration of his medications  LV function has improved. An echo Doppler study in September 2013 showed an ejection fraction of 45% and an echo Doppler study on 01/26/2014 revealed complete normalization of LV function with an ejection fraction of 60-65%.  There was grade 1 diastolic dysfunction.  He had a determine LV filling pressure.  PA pressure was normal at 11 mm.  His  ascending aorta was moderately dilated up to 4.7 cm.   He was hospitalized in April 2017 and was felt to have a TIA.  He presented with vertigo and double vision.  An MRI of the brain did not show definitive evidence of an acute stroke.  There was some evidence of low flow.  The right vertebral artery, which was confirmed by carotid Doppler study.  He underwent a 2-D echo Doppler study on 09/06/2015 which showed an EF of 55-60% with grade 1 diastolic dysfunction.  There was mild-to-moderate aortic insufficiency.  There was no evidence for cardiac source of TIA.  He was subtotally seen in neurology follow-up by Dr. WJannifer Franklinin June.  Presently he denies episodes of chest pain.  He has continued to do well the he denies any significant shortness of breath but still notes  mild shortness of breath with activity. He denies   palpitations, presyncope  syncope or arrhythmia.  He admits to occasional episodes of  indigestion and has been taking over-the-counter Tums for this with plus minus benefit.  He denies any edema.  He denies PND, orthopnea.  Since I last saw him, he has felt well.  He denies any significant shortness of breath, chest pain, or palpitations.  I titrated his Lipitor to 40 mg since on a lower dose.  LDL was elevated at 96.  He has been on a medical regimen consisting of low-dose Lanoxin at 0.0625 mg daily, Bidil regimen although he has been taking the medications individually with 20 mg tid of Isordil and 37.5 mg 3 times a day of hydralazine, spironolactone 12.5 mg twice a day, furosemide 40 mg daily, and carvedilol 12.5 mg twice a day.  He presents for reevaluation.   Past Medical History:  Diagnosis Date  . Acute pulmonary edema ([facility] 10/04/2011  . Coronary artery disease   . GERD (gastroesophageal reflux disease) 10/04/2011  . Hypertension   . LVH (left ventricular hypertrophy) 10/04/2011  . Systolic CHF ([facility] 72/12/6379  Hospitalized for flash pulmonary edema in June 2012 due to malignant HTN.   EF was 20-25% with severe global hypokinesis. left heart cath which revealed an LAD with 20% smooth narrowing but otherwise normal coronaries. On ace inhibitor, spironalactone, carvedilol. Diuresed in hospital.  Life Vest was ordered and fitted by [employer] to wear until September.       Past Surgical History:  Procedure Laterality Date  . CHOLECYSTECTOMY    . CHOLECYSTECTOMY, LAPAROSCOPIC  2006  . DENTAL SURGERY    . LEFT HEART CATHETERIZATION WITH CORONARY ANGIOGRAM N/A 10/05/2011   Procedure: LEFT HEART CATHETERIZATION WITH CORONARY ANGIOGRAM;  Surgeon: Troy Sine, MD;  Location: [facility] CATH LAB;  Service: Cardiovascular;  Laterality: N/A;  . RIGHT HEART CATHETERIZATION  10/05/2011   Procedure: RIGHT HEART CATH;  Surgeon: Troy Sine, MD;  Location: [facility] CATH LAB;  Service: Cardiovascular;;    No Known Allergies  Current Outpatient Prescriptions  Medication Sig Dispense Refill  .  aspirin EC 81 MG tablet Take 1 tablet (81 mg total) by mouth daily. 90 tablet 3  . atorvastatin (LIPITOR) 40 MG tablet Take 1/2 tablet ( 20 mg ) daily 30 tablet 6  . carvedilol (COREG) 12.5 MG tablet TAKE ONE TABLET BY MOUTH TWICE DAILY WITH A MEAL 60 tablet 5  . digoxin (LANOXIN) 0.125 MG tablet TAKE ONE-HALF TABLET BY MOUTH ONCE DAILY 15 tablet 11  . furosemide (LASIX) 40 MG tablet Take 0.5 tablets (20 mg total) by mouth daily. 30 tablet 5  . hydrALAZINE (APRESOLINE) 25 MG tablet TAKE ONE AND ONE-HALF TABLETS BY MOUTH THREE TIMES DAILY 135 tablet 6  . isosorbide dinitrate (ISORDIL) 20 MG tablet Take 1 tablet (20 mg total) by mouth 3 (three) times daily. 270 tablet 1  . pantoprazole (PROTONIX) 40 MG tablet TAKE ONE TABLET BY MOUTH ONCE DAILY 30 tablet 6  . spironolactone (ALDACTONE) 25 MG tablet Take 1 tablet (25 mg total) by mouth 2 (two) times daily. 60 tablet 11  . TRAVATAN Z 0.004 % SOLN ophthalmic solution      No current facility-administered medications for this visit.     Socially he is single. He is retired. He works in [employer]. Completed 12th grade education. There is no tobacco history. He does drink occasional alcohol.  ROS General: Negative; No fevers, chills, or night sweats;  HEENT: Negative; No changes in vision or hearing, sinus congestion, difficulty swallowing Pulmonary: Negative; No cough, wheezing, shortness of breath, hemoptysis Cardiovascular: See history of present illness; No chest pain, presyncope, syncope, palpitations GI: Negative; No nausea, vomiting, diarrhea, or abdominal pain GU: Negative; No dysuria, hematuria, or difficulty voiding Musculoskeletal: Negative; no myalgias, joint pain, or weakness Hematologic/Oncology: Negative; no easy bruising, bleeding Endocrine: Negative; no heat/cold intolerance; no diabetes Neuro: Negative; no changes in balance, headaches Skin: Negative; No rashes or skin lesions Psychiatric: Negative; No behavioral  problems, depression Sleep: Negative; No snoring, daytime sleepiness, hypersomnolence, bruxism, restless legs, hypnogognic hallucinations, no cataplexy Other comprehensive 14 point system review is negative.   PE BP 140/86 (BP Location: Right Arm, Patient Position: Sitting)   Pulse 60   Ht '5\' 6"'  (1.676 m)   Wt 182 lb 6.4 oz (82.7 kg)   BMI 29.44 kg/m    Repeat blood pressure by me 140/88  Wt Readings from Last 3 Encounters:  05/31/16 182 lb 6.4 oz (82.7 kg)  04/16/16 179 lb 12.8 oz (81.6 kg)  01/06/16 185 lb (83.9 kg)   General: Alert, oriented, no distress.  Skin: normal turgor, no rashes HEENT: Normocephalic, atraumatic. Pupils round and reactive; sclera anicteric;no lid lag.  Nose without nasal septal hypertrophy Mouth/Parynx benign; Mallinpatti scale 3 Neck: No JVD, soft left carotid briut; normal carotid upstrokes Lungs: clear to ausculatation and percussion; no wheezing or rales Chest: No tenderness to palpation Heart: RRR, s1 s2 normal no S3 gallop. 1/6 systolic murmur. No S3 gallop or S4 gallop; no diastolic murmur rubs thrills or heaves. Abdomen: soft, nontender; no  hepatosplenomehaly, BS+; abdominal aorta nontender and not dilated by palpation. Back: No CVA tenderness Pulses 2+ Extremities: no clubbing cyanosis or edema, Homan's sign negative  Neurologic: grossly nonfocal; cranial nerves grossly normal. Psychological: Normal affect and mood  ECG (independently read by me): Normal sinus rhythm at 60 bpm.  Milliseconds.  No ST segment changes.  July 2017 ECG (independently read by me): Normal sinus rhythm with first-degree AV block.  Mild T wave abnormality in lead 3  October ECG (independently read by me): normal sinus rhythm at 65 bpm..  Probable left posterior hemiblock.  Probably progression V1 through V4.  May 2016 ECG (independently read by me): Normal sinus rhythm at 75 bpm.  First-degree AV block with a PR interval 206 ms.  T-wave inversion in leads 3 and  mildly in aVF.  September 2015 ECG (independently read by me): Normal sinus rhythm with one isolated PVC.  QTc interval 397 ms.  PR interval 190 ms.  Prior January 2015 ECG (independently read by me): Normal sinus rhythm at 60 beats per minute. Normal intervals.  Previous ECG from 12/04/2012 : Sinus bradycardia 53 beats per minute period. PR interval 196; QTc interval 377 ms  LABS: BMP Latest Ref Rng & Units 04/17/2016 01/13/2016 09/04/2015  Glucose 65 - 99 mg/dL 91 89 94  BUN 7 - 25 mg/dL '15 15 11  ' Creatinine 0.70 - 1.25 mg/dL 1.39(H) 1.40(H) 1.56(H)  Sodium 135 - 146 mmol/L 138 137 138  Potassium 3.5 - 5.3 mmol/L 4.3 4.2 3.6  Chloride 98 - 110 mmol/L 104 102 108  CO2 20 - 31 mmol/L '25 21 22  ' Calcium 8.6 - 10.3 mg/dL 9.3 9.8 9.3   Hepatic Function Latest Ref Rng & Units 04/17/2016 03/19/2016 02/20/2016  Total Protein 6.1 - 8.1 g/dL 7.0 7.4 7.4  Albumin 3.6 - 5.1 g/dL 3.5(L) 3.8 3.7  AST 10 - 35 U/L 49(H) 53(H) 55(H)  ALT 9 - 46 U/L 37 39 47(H)  Alk Phosphatase 40 - 115 U/L 74 70 70  Total Bilirubin 0.2 - 1.2 mg/dL 0.8 0.7 0.7  Bilirubin, Direct <=0.2 mg/dL - 0.3(H) 0.2   CBC Latest Ref Rng & Units 09/04/2015 09/03/2015 09/02/2015  WBC 4.0 - 10.5 K/uL 5.5 6.9 7.2  Hemoglobin 13.0 - 17.0 g/dL 13.4 14.5 15.0  Hematocrit 39.0 - 52.0 % 39.6 42.1 43.0  Platelets 150 - 400 K/uL 189 209 241   Lab Results  Component Value Date   TSH 2.339 09/04/2015   Lipid Panel     Component Value Date/Time   CHOL 109 (L) 01/13/2016 1120   TRIG 89 01/13/2016 1120   HDL 43 01/13/2016 1120   CHOLHDL 2.5 01/13/2016 1120   VLDL 18 01/13/2016 1120   LDLCALC 48 01/13/2016 1120   BNP    Component Value Date/Time   PROBNP 1,662.0 (H) 10/06/2011 1610     RADIOLOGY: No results found.  IMPRESSION:  1. Essential hypertension   2. Hyperlipidemia LDL goal <70   3. Chronic renal insufficiency, stage III (moderate)   4. Drug therapy   5. Nonischemic cardiomyopathy ([place])   6. Hypertensive heart  disease with heart failure ([place])   7. Gastroesophageal reflux disease without esophagitis     ASSESSMENT AND PLAN: Mr. Matsuo is a 66 year old African-American male who has a long-standing history of hypertension and a history of a nonischemic cardiomyopathy who presented in pulmonary edema after he had stopped his blood pressure medications for several months in May 2013. Since that time on medical therapy  his ejection fraction has improved from 20% to  60-65% as noted on his echo of 01/26/2014.  He has moderate dilatation of his ascending aorta.  Pulmonary pressures are normal.  He is unaware of any arrhythmia.  Presently, he is well compensated without overt CHF symptomatology.  He developed vertigo and diplopia and was felt to have a TIA leading to his hospitalization. His most recent echo Doppler study reconfirms normal systolic function with grade 1 diastolic dysfunction, mild to moderate AR.  At that time there was no evidence for any vegetation or cardiac source of emboli.  His blood pressure today is mildly increased.  I discussed with him the new hypertensive guidelines.  I am recommending further titration of spironolactone from 12.5 mm twice a day to 25 mg twice a day.  I will decrease furosemide to 20 g daily.  I have recommended he reduce his aspirin from 325 milligrams to 81 mg. He is on Lanoxin at 0.0625 mg as well as carvedilol 12.5 mm twice a day.  He now is on atorvastatin 40 mg daily with target LDL less than 70.  He does have a very soft left carotid bruit on exam which is unchanged.  His GERD is controlled with Protonix.  He will undergo repeat blood work in 2 weeks.  I will see him in the office in 4 months for reevaluation.    Time spent: 25 minutes  Troy Sine, MD, [facility]  06/01/2016 1:30 PM

## 2016-06-18 DIAGNOSIS — E785 Hyperlipidemia, unspecified: Secondary | ICD-10-CM | POA: Diagnosis not present

## 2016-06-18 DIAGNOSIS — Z79899 Other long term (current) drug therapy: Secondary | ICD-10-CM | POA: Diagnosis not present

## 2016-06-18 DIAGNOSIS — I10 Essential (primary) hypertension: Secondary | ICD-10-CM | POA: Diagnosis not present

## 2016-06-18 LAB — COMPREHENSIVE METABOLIC PANEL
ALBUMIN: 3.6 g/dL (ref 3.6–5.1)
ALT: 48 U/L — ABNORMAL HIGH (ref 9–46)
AST: 58 U/L — ABNORMAL HIGH (ref 10–35)
Alkaline Phosphatase: 76 U/L (ref 40–115)
BUN: 17 mg/dL (ref 7–25)
CALCIUM: 9.1 mg/dL (ref 8.6–10.3)
CHLORIDE: 105 mmol/L (ref 98–110)
CO2: 24 mmol/L (ref 20–31)
CREATININE: 1.34 mg/dL — AB (ref 0.70–1.25)
Glucose, Bld: 104 mg/dL — ABNORMAL HIGH (ref 65–99)
POTASSIUM: 4.3 mmol/L (ref 3.5–5.3)
Sodium: 135 mmol/L (ref 135–146)
TOTAL PROTEIN: 7.3 g/dL (ref 6.1–8.1)
Total Bilirubin: 0.7 mg/dL (ref 0.2–1.2)

## 2016-06-18 LAB — LIPID PANEL
CHOL/HDL RATIO: 2.9 ratio (ref <5.0)
CHOLESTEROL: 104 mg/dL (ref <200)
HDL: 36 mg/dL — AB (ref >40)
LDL Cholesterol: 45 mg/dL (ref <100)
TRIGLYCERIDES: 115 mg/dL (ref <150)
VLDL: 23 mg/dL (ref <30)

## 2016-06-18 LAB — CBC
HEMATOCRIT: 42.1 % (ref 38.5–50.0)
HEMOGLOBIN: 14.2 g/dL (ref 13.2–17.1)
MCH: 26.4 pg — ABNORMAL LOW (ref 27.0–33.0)
MCHC: 33.7 g/dL (ref 32.0–36.0)
MCV: 78.3 fL — AB (ref 80.0–100.0)
MPV: 10.3 fL (ref 7.5–12.5)
Platelets: 223 10*3/uL (ref 140–400)
RBC: 5.38 MIL/uL (ref 4.20–5.80)
RDW: 15.3 % — ABNORMAL HIGH (ref 11.0–15.0)
WBC: 6.2 10*3/uL (ref 3.8–10.8)

## 2016-06-18 LAB — TSH: TSH: 4.38 mIU/L (ref 0.40–4.50)

## 2016-06-25 ENCOUNTER — Telehealth: Payer: Self-pay | Admitting: *Deleted

## 2016-06-25 DIAGNOSIS — R7989 Other specified abnormal findings of blood chemistry: Secondary | ICD-10-CM

## 2016-06-25 DIAGNOSIS — R945 Abnormal results of liver function studies: Principal | ICD-10-CM

## 2016-06-25 MED ORDER — ATORVASTATIN CALCIUM 10 MG PO TABS: 10.0000 mg | ORAL_TABLET | Freq: Every day | ORAL | 12 refills | Status: DC

## 2016-06-25 NOTE — Telephone Encounter (Signed)
-----   Message from Lennette Bihari, MD sent at 06/23/2016 10:02 AM EST ----- Lipids better; mild inc in lFTs;  Decrease lipitor by 1/2. $0 to 20 mg;  F/u LFTs in weeks

## 2016-06-25 NOTE — Telephone Encounter (Signed)
Spoke with pt, he voiced understanding and repeated medication change. New script sent to the pharmacy and Lab orders mailed to the pt.

## 2016-07-11 DIAGNOSIS — H35033 Hypertensive retinopathy, bilateral: Secondary | ICD-10-CM | POA: Diagnosis not present

## 2016-07-11 DIAGNOSIS — H2513 Age-related nuclear cataract, bilateral: Secondary | ICD-10-CM | POA: Diagnosis not present

## 2016-07-11 DIAGNOSIS — H532 Diplopia: Secondary | ICD-10-CM | POA: Diagnosis not present

## 2016-07-11 DIAGNOSIS — H401132 Primary open-angle glaucoma, bilateral, moderate stage: Secondary | ICD-10-CM | POA: Diagnosis not present

## 2016-07-13 ENCOUNTER — Telehealth: Payer: Self-pay | Admitting: Cardiovascular Disease

## 2016-07-13 ENCOUNTER — Other Ambulatory Visit: Payer: Self-pay | Admitting: Cardiovascular Disease

## 2016-07-13 NOTE — Telephone Encounter (Signed)
Pt states that he states that he will be in on Monday for lab draw. He states that he has 4 bottles of old dose of Lipitor, informed pt to keep just in case the new dose is not working.

## 2016-07-13 NOTE — Telephone Encounter (Signed)
New Message  Pt c/o medication issue:  1. Name of Medication: Atrovastatin  2. How are you currently taking this medication (dosage and times per day)? 10mg   3. Are you having a reaction (difficulty breathing--STAT)? no  4. What is your medication issue? Pt would like to speak with RN about this medication .please call back to discuss

## 2016-07-16 ENCOUNTER — Other Ambulatory Visit: Payer: Medicare Other

## 2016-07-16 DIAGNOSIS — R7989 Other specified abnormal findings of blood chemistry: Secondary | ICD-10-CM | POA: Diagnosis not present

## 2016-07-17 LAB — HEPATIC FUNCTION PANEL
ALK PHOS: 83 U/L (ref 40–115)
ALT: 53 U/L — AB (ref 9–46)
AST: 61 U/L — AB (ref 10–35)
Albumin: 3.5 g/dL — ABNORMAL LOW (ref 3.6–5.1)
BILIRUBIN INDIRECT: 0.4 mg/dL (ref 0.2–1.2)
Bilirubin, Direct: 0.2 mg/dL (ref <=0.2)
TOTAL PROTEIN: 7.1 g/dL (ref 6.1–8.1)
Total Bilirubin: 0.6 mg/dL (ref 0.2–1.2)

## 2016-07-30 ENCOUNTER — Other Ambulatory Visit: Payer: Self-pay | Admitting: Cardiovascular Disease

## 2016-07-30 NOTE — Telephone Encounter (Signed)
Rx(s) sent to pharmacy electronically.  

## 2016-08-01 ENCOUNTER — Encounter: Payer: Self-pay | Admitting: Family Medicine

## 2016-08-01 ENCOUNTER — Ambulatory Visit: Payer: Medicare Other | Attending: Family Medicine | Admitting: Family Medicine

## 2016-08-01 VITALS — BP 146/82 | HR 88 | Temp 98.0°F | Ht 66.0 in | Wt 185.0 lb

## 2016-08-01 DIAGNOSIS — G459 Transient cerebral ischemic attack, unspecified: Secondary | ICD-10-CM | POA: Diagnosis not present

## 2016-08-01 DIAGNOSIS — N183 Chronic kidney disease, stage 3 unspecified: Secondary | ICD-10-CM

## 2016-08-01 DIAGNOSIS — K219 Gastro-esophageal reflux disease without esophagitis: Secondary | ICD-10-CM | POA: Insufficient documentation

## 2016-08-01 DIAGNOSIS — I130 Hypertensive heart and chronic kidney disease with heart failure and stage 1 through stage 4 chronic kidney disease, or unspecified chronic kidney disease: Secondary | ICD-10-CM | POA: Diagnosis not present

## 2016-08-01 DIAGNOSIS — L918 Other hypertrophic disorders of the skin: Secondary | ICD-10-CM | POA: Insufficient documentation

## 2016-08-01 DIAGNOSIS — Z8673 Personal history of transient ischemic attack (TIA), and cerebral infarction without residual deficits: Secondary | ICD-10-CM | POA: Diagnosis not present

## 2016-08-01 DIAGNOSIS — I429 Cardiomyopathy, unspecified: Secondary | ICD-10-CM | POA: Diagnosis not present

## 2016-08-01 DIAGNOSIS — Z1211 Encounter for screening for malignant neoplasm of colon: Secondary | ICD-10-CM

## 2016-08-01 DIAGNOSIS — I5032 Chronic diastolic (congestive) heart failure: Secondary | ICD-10-CM | POA: Diagnosis not present

## 2016-08-01 DIAGNOSIS — Q828 Other specified congenital malformations of skin: Secondary | ICD-10-CM

## 2016-08-01 DIAGNOSIS — I428 Other cardiomyopathies: Secondary | ICD-10-CM | POA: Diagnosis not present

## 2016-08-01 DIAGNOSIS — R531 Weakness: Secondary | ICD-10-CM | POA: Insufficient documentation

## 2016-08-01 DIAGNOSIS — I2510 Atherosclerotic heart disease of native coronary artery without angina pectoris: Secondary | ICD-10-CM | POA: Diagnosis not present

## 2016-08-01 DIAGNOSIS — H532 Diplopia: Secondary | ICD-10-CM | POA: Insufficient documentation

## 2016-08-01 DIAGNOSIS — I10 Essential (primary) hypertension: Secondary | ICD-10-CM

## 2016-08-01 NOTE — Progress Notes (Signed)
Subjective:    Patient ID: Thomas Bullock, male    DOB: 29-Aug-1950, 66 y.o.   MRN: [id number]  HPI He is a 66 year old male with a history of hypertension,Nonischemic cardiomyopathy, CHF (EF 55-60%, grade 1 diastolic dysfunction from 09/2015), stage III chronic kidney disease, TIA after he had presented with weakness and diplopia but imaging was negative for stroke (CT head negative for acute process, MRI brain revealing distal right vertebral artery abnormality which may reflect diminished flow from proximal stenosis or occlusion).  He previously had diplopia which has now resolved and he is being managed by ophthalmology . He remains on Travatan eyedrops at this time. Denies weakness of any extremity or numbness; he does have some stuttering which he states he has always had. He last saw his neurologist in June/2017.  He is also followed closely by cardiology - Dr Tresa Endo; last visit was in 05/2016 where his spironolactone was increased to 25 mg and aspirin decreased to 81 mg. He denies chest pains, shortness of breath.   He does have a couple of skin tags in his posterior neck which she would like excised due to cosmetic reasons.   Past Medical History:  Diagnosis Date  . Acute pulmonary edema (HCC) 10/04/2011  . Coronary artery disease   . GERD (gastroesophageal reflux disease) 10/04/2011  . Hypertension   . LVH (left ventricular hypertrophy) 10/04/2011  . Systolic CHF (HCC) 11/07/2011   Hospitalized for flash pulmonary edema in June 2012 due to malignant HTN.   EF was 20-25% with severe global hypokinesis. left heart cath which revealed an LAD with 20% smooth narrowing but otherwise normal coronaries. On ace inhibitor, spironalactone, carvedilol. Diuresed in hospital.  Life Vest was ordered and fitted by [employer] to wear until September.       Past Surgical History:  Procedure Laterality Date  . CHOLECYSTECTOMY    . CHOLECYSTECTOMY, LAPAROSCOPIC  2006  . DENTAL SURGERY    . LEFT HEART  CATHETERIZATION WITH CORONARY ANGIOGRAM N/A 10/05/2011   Procedure: LEFT HEART CATHETERIZATION WITH CORONARY ANGIOGRAM;  Surgeon: Lennette Bihari, MD;  Location: [facility] CATH LAB;  Service: Cardiovascular;  Laterality: N/A;  . RIGHT HEART CATHETERIZATION  10/05/2011   Procedure: RIGHT HEART CATH;  Surgeon: Lennette Bihari, MD;  Location: [facility] CATH LAB;  Service: Cardiovascular;;    No Known Allergies    Review of Systems  Constitutional: Negative for activity change and appetite change.  HENT: Negative for sinus pressure and sore throat.   Eyes: Negative for visual disturbance.  Respiratory: Negative for cough, chest tightness and shortness of breath.   Cardiovascular: Negative for chest pain and leg swelling.  Gastrointestinal: Negative for abdominal distention, abdominal pain, constipation and diarrhea.  Endocrine: Negative.   Genitourinary: Negative for dysuria. Frequency: Stuttering speech.  Musculoskeletal: Negative for joint swelling and myalgias.  Skin:       See hpi   Allergic/Immunologic: Negative.   Neurological: Positive for speech difficulty. Negative for weakness, light-headedness and numbness.  Psychiatric/Behavioral: Negative for dysphoric mood and suicidal ideas.       Objective: Vitals:   08/01/16 0936  BP: (!) 146/82  Pulse: 88  Temp: 98 F (36.7 C)  TempSrc: Oral  SpO2: 98%  Weight: 185 lb (83.9 kg)  Height: 5\' 6"  (1.676 m)      Physical Exam Constitutional: He is oriented to person, place, and time. He appears well-developed and well-nourished.  Cardiovascular: Normal rate, normal heart sounds and intact distal pulses.   No  murmur heard. Pulmonary/Chest: Effort normal and breath sounds normal. He has no wheezes. He has no rales. He exhibits no tenderness.  Abdominal: Soft. Bowel sounds are normal. He exhibits no distension and no mass. There is no tenderness.  Musculoskeletal: Normal range of motion.  Neurological: He is alert and oriented to person, place,  and time.  stuttering speech  Skin: Multiple skin tags on posterior neck Psych: Normal mood and affect.   CMP Latest Ref Rng & Units 07/16/2016 06/18/2016 04/17/2016  Glucose 65 - 99 mg/dL - 706(C) 91  BUN 7 - 25 mg/dL - 17 15  Creatinine 3.76 - 1.25 mg/dL - 2.83(T) 5.17(O)  Sodium 135 - 146 mmol/L - 135 138  Potassium 3.5 - 5.3 mmol/L - 4.3 4.3  Chloride 98 - 110 mmol/L - 105 104  CO2 20 - 31 mmol/L - 24 25  Calcium 8.6 - 10.3 mg/dL - 9.1 9.3  Total Protein 6.1 - 8.1 g/dL 7.1 7.3 7.0  Total Bilirubin 0.2 - 1.2 mg/dL 0.6 0.7 0.8  Alkaline Phos 40 - 115 U/L 83 76 74  AST 10 - 35 U/L 61(H) 58(H) 49(H)  ALT 9 - 46 U/L 53(H) 48(H) 37   Lipid Panel     Component Value Date/Time   CHOL 104 06/18/2016 0931   TRIG 115 06/18/2016 0931   HDL 36 (L) 06/18/2016 0931   CHOLHDL 2.9 06/18/2016 0931   VLDL 23 06/18/2016 0931   LDLCALC 45 06/18/2016 0931       Assessment & Plan:  1. Essential hypertension Slightly elevated above goal of less than 130/80 Continue current medications. Low-sodium, DASH diet   2. Nonischemic cardiomyopathy (HCC) 55-60%, grade 1 diastolic dysfunction from 2-D echo 09/2015 Asymptomatic Aggressive risk factor modification Currently on a beta blocker, statin, digoxin ; not on ACE inhibitor due to CKD Follow up with cardiologist- Dr Tresa Endo  3. Chronic renal insufficiency, stage III (moderate) Stable Avoid nephrotoxic agents We'll monitor renal function, CMET ordered  4. Transient cerebral ischemia, unspecified transient cerebral ischemia type Stable Diplopia has resolved No residual deficits  5. Skin tags Referred to dermatology  6. HCM Referred for colonoscopy Declines Pneumovax

## 2016-08-20 ENCOUNTER — Encounter: Payer: Self-pay | Admitting: *Deleted

## 2016-08-20 NOTE — Telephone Encounter (Signed)
Patient's atorvastatin was reduced and labs repeated the last time. Will consult with Dr Tresa Endo for further recommendations.

## 2016-08-22 ENCOUNTER — Encounter: Payer: Self-pay | Admitting: *Deleted

## 2016-08-22 ENCOUNTER — Telehealth: Payer: Self-pay | Admitting: *Deleted

## 2016-08-22 ENCOUNTER — Other Ambulatory Visit: Payer: Self-pay | Admitting: *Deleted

## 2016-08-22 DIAGNOSIS — R7989 Other specified abnormal findings of blood chemistry: Secondary | ICD-10-CM

## 2016-08-22 DIAGNOSIS — E785 Hyperlipidemia, unspecified: Secondary | ICD-10-CM

## 2016-08-22 DIAGNOSIS — R945 Abnormal results of liver function studies: Secondary | ICD-10-CM

## 2016-08-22 NOTE — Telephone Encounter (Signed)
-----   Message from Lennette Bihari, MD sent at 08/12/2016  9:57 PM EDT ----- LFTs mildly elevated; decrease Lipitor to 20 mg from 40 mg.  Recheck lipid panel and LFTs in 4-6 weeks.

## 2016-08-22 NOTE — Telephone Encounter (Signed)
Spoke with Dr Tresa Endo informed him these orders were given after the last blood draw. These are the repeat labs. He then ordered me to have the patient to hold the atorvastatin for 2 weeks then have  Labs repeated again. Patient notified.

## 2016-09-06 LAB — HEPATIC FUNCTION PANEL
ALK PHOS: 74 U/L (ref 40–115)
ALT: 58 U/L — ABNORMAL HIGH (ref 9–46)
AST: 71 U/L — ABNORMAL HIGH (ref 10–35)
Albumin: 3.7 g/dL (ref 3.6–5.1)
BILIRUBIN DIRECT: 0.5 mg/dL — AB (ref <=0.2)
BILIRUBIN INDIRECT: 0.7 mg/dL (ref 0.2–1.2)
Total Bilirubin: 1.2 mg/dL (ref 0.2–1.2)
Total Protein: 7.4 g/dL (ref 6.1–8.1)

## 2016-09-06 LAB — LIPID PANEL
Cholesterol: 140 mg/dL (ref <200)
HDL: 36 mg/dL — ABNORMAL LOW (ref >40)
LDL Cholesterol: 74 mg/dL (ref <100)
Total CHOL/HDL Ratio: 3.9 Ratio (ref <5.0)
Triglycerides: 150 mg/dL — ABNORMAL HIGH (ref <150)
VLDL: 30 mg/dL (ref <30)

## 2016-09-18 ENCOUNTER — Telehealth: Payer: Self-pay | Admitting: *Deleted

## 2016-09-18 ENCOUNTER — Other Ambulatory Visit: Payer: Self-pay | Admitting: *Deleted

## 2016-09-18 DIAGNOSIS — R945 Abnormal results of liver function studies: Principal | ICD-10-CM

## 2016-09-18 DIAGNOSIS — R7989 Other specified abnormal findings of blood chemistry: Secondary | ICD-10-CM

## 2016-09-18 NOTE — Telephone Encounter (Signed)
-----   Message from Lennette Bihari, MD sent at 09/17/2016  6:51 AM EDT ----- Triglycerides are upper normal.  LDL is now 74.  LFTs remain slightly increased.  Would decrease atorvastatin dose by one half of his present dose.  Improve diet.  Recheck LFTs in 4 weeks

## 2016-09-18 NOTE — Telephone Encounter (Signed)
After bring to Dr Landry Dyke attention that we decreased the atorvastatin last time and his LFT's went up, he gave verbal order to have patient to hold  Atorvastatin for 2 weeks and recheck LFT's. Patient notified and voiced understanding.

## 2016-09-20 ENCOUNTER — Other Ambulatory Visit: Payer: Self-pay | Admitting: Cardiovascular Disease

## 2016-09-27 ENCOUNTER — Telehealth: Payer: Self-pay | Admitting: Cardiovascular Disease

## 2016-09-27 NOTE — Telephone Encounter (Signed)
Pt has active lab orders, pt notified

## 2016-09-27 NOTE — Telephone Encounter (Signed)
Pt wants to know if he needs lab work before his appt on 10-09-16?

## 2016-10-01 ENCOUNTER — Other Ambulatory Visit: Payer: Self-pay | Admitting: Cardiovascular Disease

## 2016-10-06 LAB — HEPATIC FUNCTION PANEL
ALBUMIN: 3.6 g/dL (ref 3.6–5.1)
ALK PHOS: 88 U/L (ref 40–115)
ALT: 49 U/L — ABNORMAL HIGH (ref 9–46)
AST: 59 U/L — AB (ref 10–35)
BILIRUBIN INDIRECT: 0.5 mg/dL (ref 0.2–1.2)
BILIRUBIN TOTAL: 0.7 mg/dL (ref 0.2–1.2)
Bilirubin, Direct: 0.2 mg/dL (ref <=0.2)
TOTAL PROTEIN: 7 g/dL (ref 6.1–8.1)

## 2016-10-09 ENCOUNTER — Telehealth: Payer: Self-pay | Admitting: Cardiovascular Disease

## 2016-10-09 ENCOUNTER — Ambulatory Visit (INDEPENDENT_AMBULATORY_CARE_PROVIDER_SITE_OTHER): Payer: Medicare Other | Admitting: Physician Assistant

## 2016-10-09 ENCOUNTER — Encounter: Payer: Self-pay | Admitting: Physician Assistant

## 2016-10-09 VITALS — BP 154/74 | HR 73 | Ht 66.0 in | Wt 182.2 lb

## 2016-10-09 DIAGNOSIS — I10 Essential (primary) hypertension: Secondary | ICD-10-CM

## 2016-10-09 DIAGNOSIS — Z79899 Other long term (current) drug therapy: Secondary | ICD-10-CM | POA: Diagnosis not present

## 2016-10-09 DIAGNOSIS — I5032 Chronic diastolic (congestive) heart failure: Secondary | ICD-10-CM

## 2016-10-09 DIAGNOSIS — I428 Other cardiomyopathies: Secondary | ICD-10-CM | POA: Diagnosis not present

## 2016-10-09 DIAGNOSIS — I2510 Atherosclerotic heart disease of native coronary artery without angina pectoris: Secondary | ICD-10-CM | POA: Diagnosis not present

## 2016-10-09 MED ORDER — CARVEDILOL 25 MG PO TABS: 25.0000 mg | ORAL_TABLET | Freq: Two times a day (BID) | ORAL | 3 refills | Status: DC

## 2016-10-09 NOTE — Telephone Encounter (Signed)
Spoke with pt, his brother and a nurse came over and checked his bp this morning about 30 min after he took his medications. He has an appointment today at 11 am with dr Tresa Endo. He reports he feels fine but is anxious about the readings. Explained to the patient to give his medications a little longer to taker effect and he should keep his appointment to day.

## 2016-10-09 NOTE — Progress Notes (Signed)
Cardiology Office Note    Date:  10/10/2016   ID:  Thomas Bullock, DOB 1950/10/12, MRN [id number]  PCP:  Jaclyn Shaggy, MD  Cardiologist:  Dr. Tresa Endo   Chief Complaint  Patient presents with  . Follow-up    seen for Dr. Tresa Endo.     History of Present Illness:  Thomas Bullock is a 66 y.o. male with PMH of HTN, chronic diastolic HF, NICM and CAD. He was admitted to [facility] in May 2013 with acute pulmonary edema. He was found to have nonischemic cardiomyopathy with ejection fraction of 20-25% and minimal CAD with 20% narrowing in the LAD noted on catheterization. He was placed on heart failure medication. Repeat echocardiogram by September 2013 showed improvement of ejection fraction to 45%. EF completely normalized to 60-65% by September 2015 on repeat echo. He was admitted in April 2017 with TIA. MRI of the brain did not show definitive evidence of acute stroke. Echocardiogram obtained during that admission showed EF 55-60%, grade 1 diastolic dysfunction, mild-to-moderate AR. He was last seen by Dr. Tresa Endo on 05/31/2016, his spironolactone was increased to 25 mg daily, Lasix was decreased to 20 mg daily. Based on recent phone note, apparently his Lipitor was held after LFT went up.  He has been doing well at home, denies any significant chest discomfort or shortness of breath. There is no sign of volume overload on physical exam. To this date, I am not entirely sure what is causing the elevated LFT. We will repeat LFT and month. Looking back, his LFT has been borderline elevated between 40s to 50s since 2013.    Past Medical History:  Diagnosis Date  . Acute pulmonary edema (HCC) 10/04/2011  . Coronary artery disease   . GERD (gastroesophageal reflux disease) 10/04/2011  . Hypertension   . LVH (left ventricular hypertrophy) 10/04/2011  . Systolic CHF (HCC) 11/07/2011   Hospitalized for flash pulmonary edema in June 2012 due to malignant HTN.   EF was 20-25% with severe global  hypokinesis. left heart cath which revealed an LAD with 20% smooth narrowing but otherwise normal coronaries. On ace inhibitor, spironalactone, carvedilol. Diuresed in hospital.  Life Vest was ordered and fitted by [employer] to wear until September.       Past Surgical History:  Procedure Laterality Date  . CHOLECYSTECTOMY    . CHOLECYSTECTOMY, LAPAROSCOPIC  2006  . DENTAL SURGERY    . LEFT HEART CATHETERIZATION WITH CORONARY ANGIOGRAM N/A 10/05/2011   Procedure: LEFT HEART CATHETERIZATION WITH CORONARY ANGIOGRAM;  Surgeon: Lennette Bihari, MD;  Location: [facility] CATH LAB;  Service: Cardiovascular;  Laterality: N/A;  . RIGHT HEART CATHETERIZATION  10/05/2011   Procedure: RIGHT HEART CATH;  Surgeon: Lennette Bihari, MD;  Location: [facility] CATH LAB;  Service: Cardiovascular;;    Current Medications: Outpatient Medications Prior to Visit  Medication Sig Dispense Refill  . aspirin EC 81 MG tablet Take 1 tablet (81 mg total) by mouth daily. 90 tablet 3  . digoxin (LANOXIN) 0.125 MG tablet TAKE ONE-HALF TABLET BY MOUTH ONCE DAILY 15 tablet 11  . furosemide (LASIX) 40 MG tablet Take 0.5 tablets (20 mg total) by mouth daily. 30 tablet 5  . hydrALAZINE (APRESOLINE) 25 MG tablet Take 1.5 tablets (37.5 mg total) by mouth 3 (three) times daily. 405 tablet 3  . isosorbide dinitrate (ISORDIL) 20 MG tablet Take 1 tablet (20 mg total) by mouth 3 (three) times daily. 270 tablet 1  . pantoprazole (PROTONIX) 40 MG tablet TAKE  ONE TABLET BY MOUTH ONCE DAILY 30 tablet 5  . spironolactone (ALDACTONE) 25 MG tablet Take 1 tablet (25 mg total) by mouth 2 (two) times daily. 60 tablet 11  . TRAVATAN Z 0.004 % SOLN ophthalmic solution     . carvedilol (COREG) 12.5 MG tablet TAKE ONE TABLET BY MOUTH TWICE DAILY WITH A  MEAL. 60 tablet 5  . atorvastatin (LIPITOR) 10 MG tablet Take 1 tablet (10 mg total) by mouth daily at 6 PM. (Patient not taking: Reported on 10/09/2016) 30 tablet 12  . spironolactone (ALDACTONE) 25 MG tablet TAKE ONE-HALF  TABLET BY MOUTH TWICE DAILY 30 tablet 8   No facility-administered medications prior to visit.      Allergies:   Patient has no known allergies.   Social History   Social History  . Marital status: Single    Spouse name: N/A  . Number of children: 0  . Years of education: N/A   Social History Main Topics  . Smoking status: Former Smoker    Types: Cigarettes    Quit date: 05/07/1973  . Smokeless tobacco: Never Used     Comment: on 11/07/11 patient states never smoker.   . Alcohol use 1.2 oz/week    2 Cans of beer per week     Comment: weekends  . Drug use: No  . Sexual activity: Yes   Other Topics Concern  . None   Social History Narrative   Patient with finanacial difficulties. Unable to work at [employer] because of him having to wear lifevest. Will have job opportunity once off in September. Attempting [employer].       High school graduate but expresses has some difficulty reading at times if complicated.    Working at [employer] previously.       Cell phone [phone] not ok to leave message   Home phone [phone] ok to leave message.       Lives with girlfriend. No kids. Friend helps with transport.      Family History:  The patient's family history includes Diabetes type II in his mother; Heart attack in his father; Heart disease in his brother and sister; Kidney disease in his mother; Stroke in his mother.   ROS:   Please see the history of present illness.    ROS All other systems reviewed and are negative.   PHYSICAL EXAM:   VS:  BP (!) 154/74   Pulse 73   Ht 5\' 6"  (1.676 m)   Wt 182 lb 3.2 oz (82.6 kg)   SpO2 97%   BMI 29.41 kg/m    GEN: Well nourished, well developed, in no acute distress  HEENT: normal  Neck: no JVD, carotid bruits, or masses Cardiac: RRR; no murmurs, rubs, or gallops,no edema  Respiratory:  clear to auscultation bilaterally, normal work of breathing GI: soft, nontender, nondistended, + BS MS: no deformity or  atrophy  Skin: warm and dry, no rash Neuro:  Alert and Oriented x 3, Strength and sensation are intact Psych: euthymic mood, full affect  Wt Readings from Last 3 Encounters:  10/09/16 182 lb 3.2 oz (82.6 kg)  08/01/16 185 lb (83.9 kg)  05/31/16 182 lb 6.4 oz (82.7 kg)      Studies/Labs Reviewed:   EKG:  EKG is not ordered today.   Recent Labs: 06/18/2016: BUN 17; Creat 1.34; Hemoglobin 14.2; Platelets 223; Potassium 4.3; Sodium 135; TSH 4.38 10/05/2016: ALT 49   Lipid Panel    Component  Value Date/Time   CHOL 140 09/05/2016 1335   TRIG 150 (H) 09/05/2016 1335   HDL 36 (L) 09/05/2016 1335   CHOLHDL 3.9 09/05/2016 1335   VLDL 30 09/05/2016 1335   LDLCALC 74 09/05/2016 1335    Additional studies/ records that were reviewed today include:   Echo 09/06/2015 LV EF: 55% -   60%  Study Conclusions  - Left ventricle: The cavity size was normal. There was severe   hypertrophy of the septum and mild posterior wall hypertrophy.   Systolic function was normal. The estimated ejection fraction was   in the range of 55% to 60%. Wall motion was normal; there were no   regional wall motion abnormalities. Doppler parameters are   consistent with abnormal left ventricular relaxation (grade 1   diastolic dysfunction). Doppler parameters are consistent with   high ventricular filling pressure. - Aortic valve: Transvalvular velocity was within the normal range.   There was no stenosis. There was mild to moderate regurgitation. - Mitral valve: Transvalvular velocity was within the normal range.   There was no evidence for stenosis. There was no regurgitation. - Right ventricle: The cavity size was normal. Wall thickness was   normal. Systolic function was normal. - Atrial septum: No defect or patent foramen ovale was identified   by color flow Doppler. - Tricuspid valve: There was trivial regurgitation. - Inferior vena cava: The vessel was normal in size. The   respirophasic diameter  changes were in the normal range (>= 50%),   consistent with normal central venous pressure.  Impressions:  - There was no evidence of a vegetation.   ASSESSMENT:    1. Chronic diastolic heart failure (HCC)   2. Medication management   3. NICM (nonischemic cardiomyopathy) (HCC)   4. Coronary artery disease involving native coronary artery of native heart without angina pectoris   5. Essential hypertension      PLAN:  In order of problems listed above:  1. Chronic diastolic heart failure, euvolemic on physical exam. Continue on current dose of spironolactone and Lasix. I think it may be appropriate to consider discontinuation of digoxin at some point.  2. NICM: Ejection fraction has normalized since 2015.  3. Coronary artery disease: Minimal disease on previous cardiac catheterization in 2013  4. Hypertension: Blood pressure elevated today 154/74. It has been elevated since January. I have increased his carvedilol to 25 mg twice a day.    Medication Adjustments/Labs and Tests Ordered: Current medicines are reviewed at length with the patient today.  Concerns regarding medicines are outlined above.  Medication changes, Labs and Tests ordered today are listed in the Patient Instructions below. Patient Instructions  Medication Instructions: Increase your Carvedilol to 25 mg twice daily  Labwork: Your physician recommends that you return for lab work in: One month for a Hepatic Panel lab  Follow-Up: Your physician wants you to follow-up in: 5 months with Dr. Tresa Endo. You will receive a reminder letter in the mail two months in advance. If you don't receive a letter, please call our office to schedule the follow-up appointment.   If you need a refill on your cardiac medications before your next appointment, please call your pharmacy.      Ramond Dial, [place]  10/10/2016 2:09 PM    [facility] [address] [place], [place], [place]  [address] Phone: [phone]; Fax: [phone]

## 2016-10-09 NOTE — Telephone Encounter (Signed)
New Message     bp 180/110  Is this too high or should he go to the hospital

## 2016-10-09 NOTE — Patient Instructions (Signed)
Medication Instructions: Increase your Carvedilol to 25 mg twice daily  Labwork: Your physician recommends that you return for lab work in: One month for a Hepatic Panel lab  Follow-Up: Your physician wants you to follow-up in: 5 months with Dr. Tresa Endo. You will receive a reminder letter in the mail two months in advance. If you don't receive a letter, please call our office to schedule the follow-up appointment.   If you need a refill on your cardiac medications before your next appointment, please call your pharmacy.

## 2016-10-10 ENCOUNTER — Encounter: Payer: Self-pay | Admitting: Physician Assistant

## 2016-10-16 ENCOUNTER — Other Ambulatory Visit: Payer: Self-pay | Admitting: Cardiovascular Disease

## 2016-10-23 ENCOUNTER — Other Ambulatory Visit: Payer: Self-pay | Admitting: Gastroenterology

## 2016-10-23 DIAGNOSIS — R945 Abnormal results of liver function studies: Secondary | ICD-10-CM | POA: Diagnosis not present

## 2016-10-23 DIAGNOSIS — N183 Chronic kidney disease, stage 3 (moderate): Secondary | ICD-10-CM | POA: Diagnosis not present

## 2016-10-23 DIAGNOSIS — Z1211 Encounter for screening for malignant neoplasm of colon: Secondary | ICD-10-CM | POA: Diagnosis not present

## 2016-10-23 DIAGNOSIS — Z8673 Personal history of transient ischemic attack (TIA), and cerebral infarction without residual deficits: Secondary | ICD-10-CM | POA: Diagnosis not present

## 2016-10-23 DIAGNOSIS — R7989 Other specified abnormal findings of blood chemistry: Secondary | ICD-10-CM

## 2016-10-23 DIAGNOSIS — Z8679 Personal history of other diseases of the circulatory system: Secondary | ICD-10-CM | POA: Diagnosis not present

## 2016-10-26 ENCOUNTER — Encounter: Payer: Self-pay | Admitting: Physician Assistant

## 2016-10-26 ENCOUNTER — Telehealth: Payer: Self-pay | Admitting: *Deleted

## 2016-10-26 NOTE — Telephone Encounter (Signed)
Clearance letter for colonoscopy generated by hao meng pa faxed to the number provided.

## 2016-10-29 DIAGNOSIS — R768 Other specified abnormal immunological findings in serum: Secondary | ICD-10-CM | POA: Diagnosis not present

## 2016-10-31 ENCOUNTER — Ambulatory Visit
Admission: RE | Admit: 2016-10-31 | Discharge: 2016-10-31 | Disposition: A | Discharge status: Home / Self Care | Payer: Medicare Other | Source: Ambulatory Visit | Attending: Gastroenterology | Admitting: Gastroenterology

## 2016-10-31 DIAGNOSIS — R7989 Other specified abnormal findings of blood chemistry: Secondary | ICD-10-CM

## 2016-10-31 DIAGNOSIS — R945 Abnormal results of liver function studies: Secondary | ICD-10-CM

## 2016-12-10 ENCOUNTER — Other Ambulatory Visit: Payer: Self-pay | Admitting: Cardiovascular Disease

## 2017-01-09 ENCOUNTER — Other Ambulatory Visit: Payer: Self-pay | Admitting: Cardiovascular Disease

## 2017-04-17 IMAGING — MR MR HEAD W/O CM
9 of 10 series · 35 of 48 positions shown · non-contrast
Comparison: Head CT 09/02/2015

CLINICAL DATA: Dizziness since yesterday evening. Assessment for
posterior circulation stroke.

EXAM:
MRI HEAD WITHOUT CONTRAST
TECHNIQUE: Multiplanar, multiecho pulse sequences of the brain and surrounding
structures were obtained without intravenous contrast.

[Series 3: T1 · sagittal · 5.0mm · 0.47mm/px · 1 of 23 slices shown]
[im 1/23]
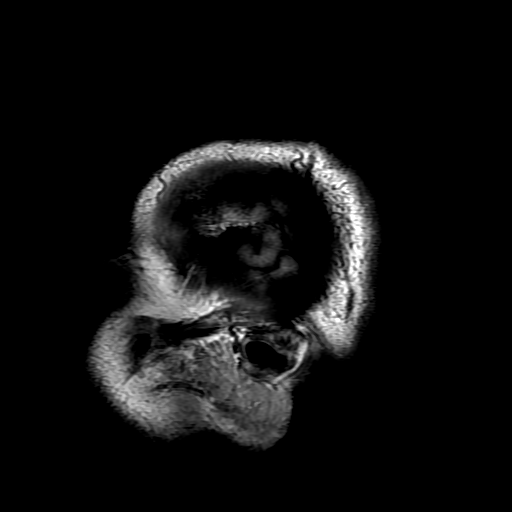

[Series 4: DWI · axial · 3.0mm · 1.09mm/px · z∈[-46,+98]mm · 8 of 98 slices shown (1 of 4)]
[im 1/98]
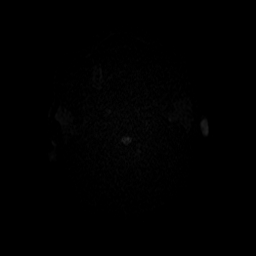
[im 11/98]
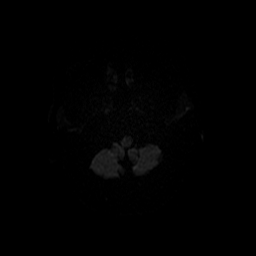
[im 33/98]
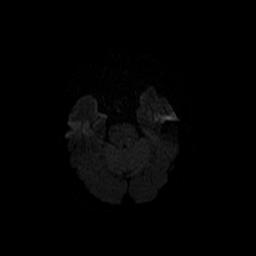
[im 44/98]
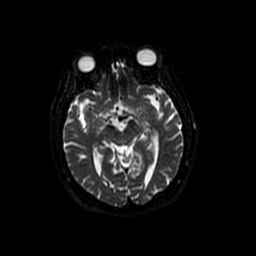
[im 54/98]
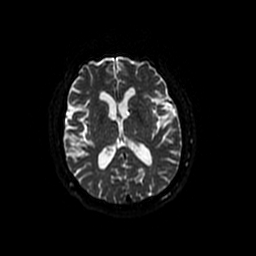
[im 65/98]
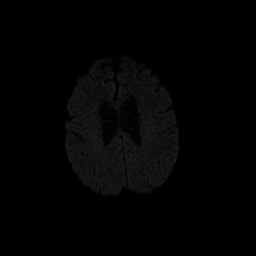
[im 87/98]
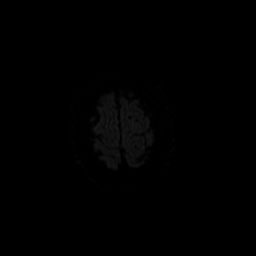
[im 98/98]
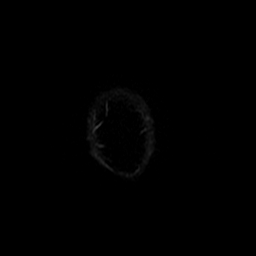

[Series 5: T2 · axial · 5.0mm · 0.43mm/px · z∈[-46,+98]mm · 3 of 25 slices shown (1 of 2)]
[im 1/25]
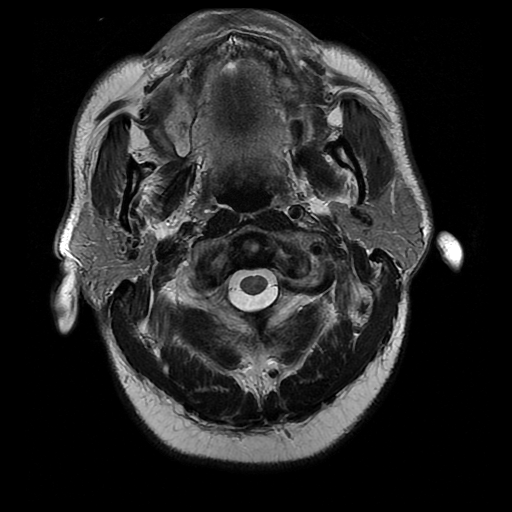
[im 13/25]
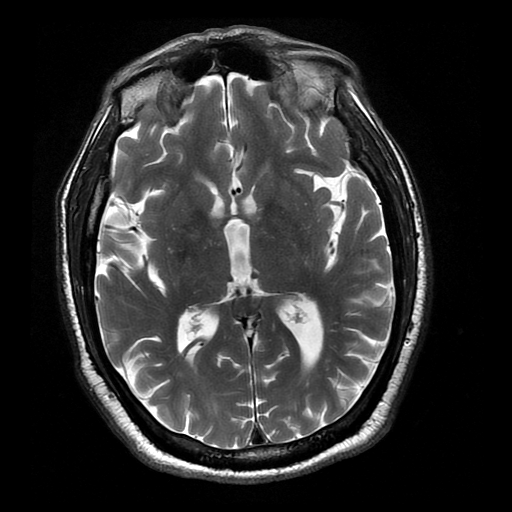
[im 25/25]
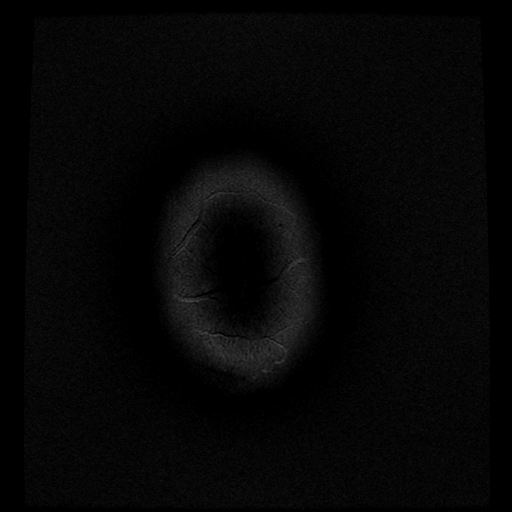

[Series 6: DWI · coronal · 5.0mm · 1.09mm/px · 7 of 66 slices shown (2 of 4)]
[im 1/66]
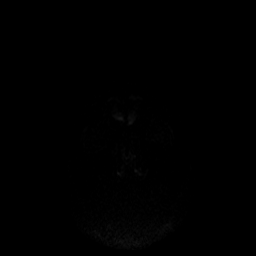
[im 11/66]
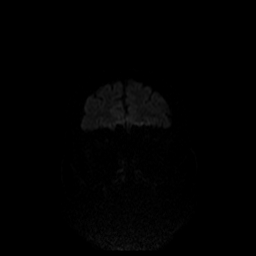
[im 22/66]
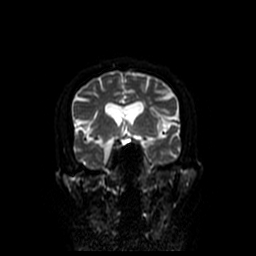
[im 33/66]
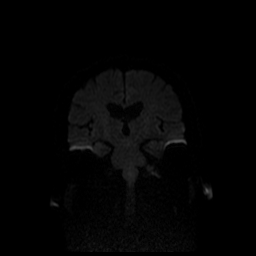
[im 44/66]
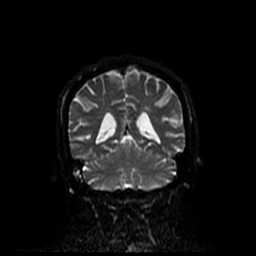
[im 55/66]
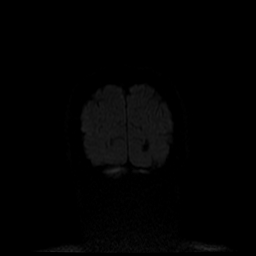
[im 66/66]
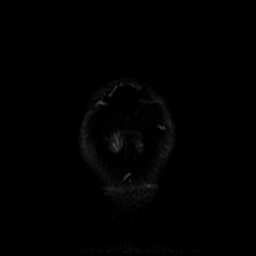

[Series 7: FLAIR · axial · 5.0mm · 0.43mm/px · z∈[-46,+98]mm · 3 of 25 slices shown]
[im 1/25]
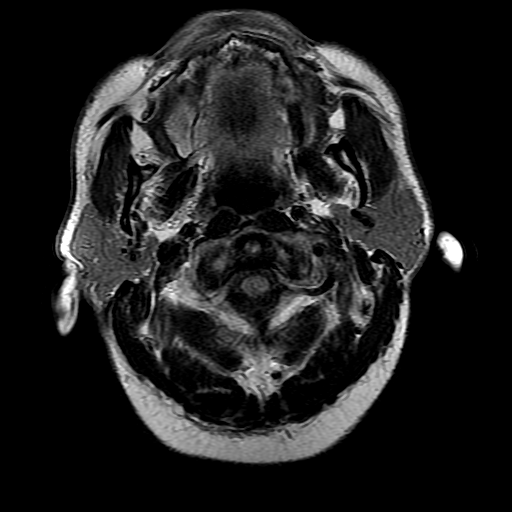
[im 13/25]
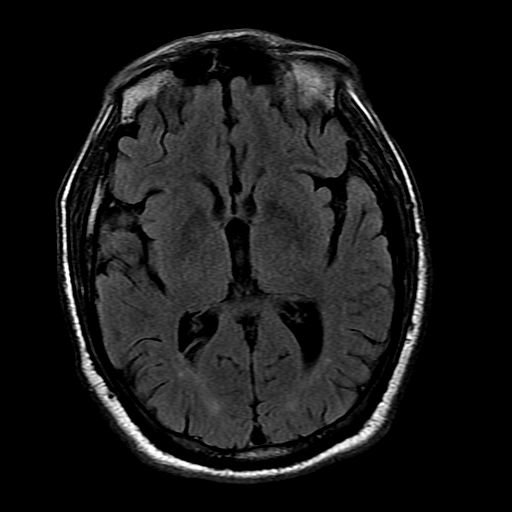
[im 25/25]
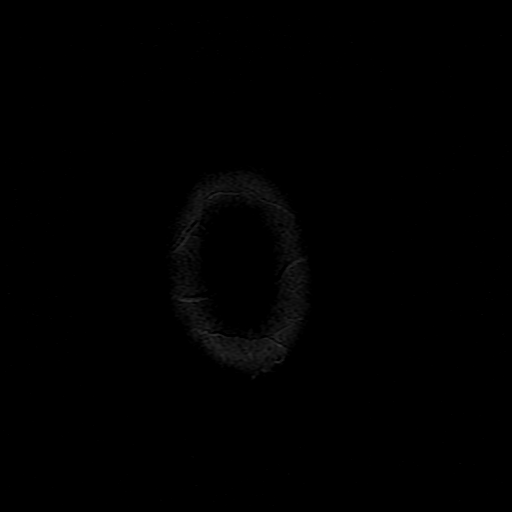

[Series 8: ax mpgr · axial · 5.0mm · 0.43mm/px · z∈[-46,+26]mm · 2 of 25 slices shown]
[im 1/25]
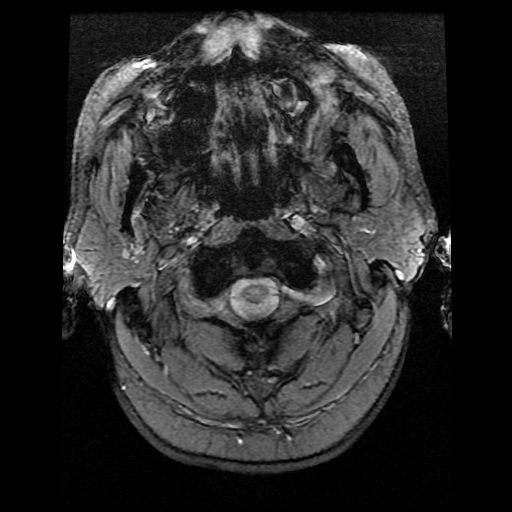
[im 13/25]
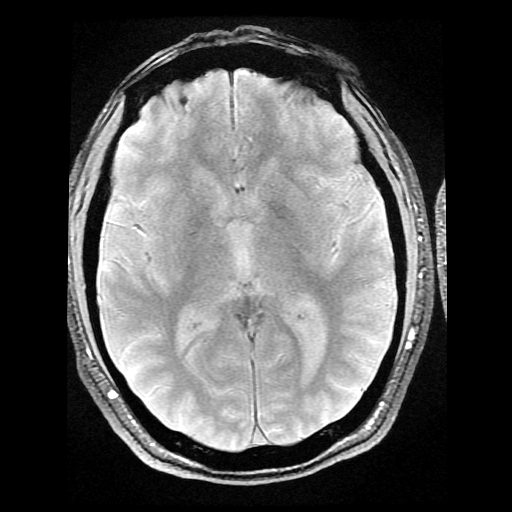

[Series 10: T2 · coronal · 5.0mm · 0.39mm/px · 3 of 25 slices shown (2 of 2)]
[im 1/25]
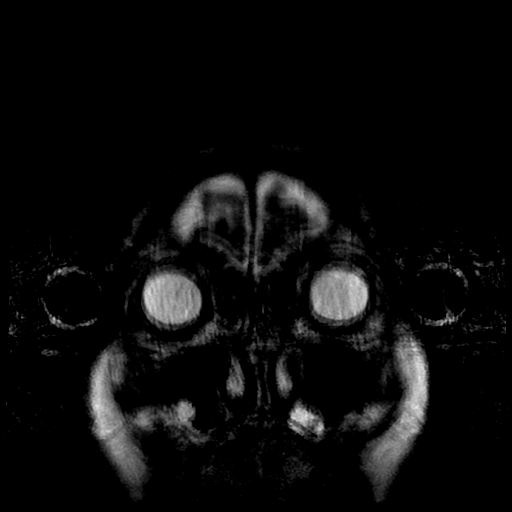
[im 13/25]
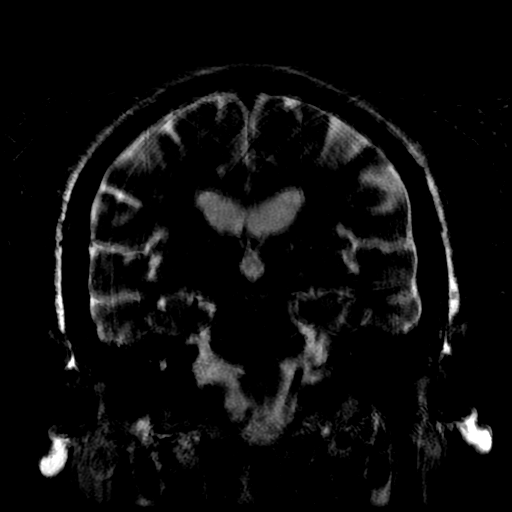
[im 25/25]
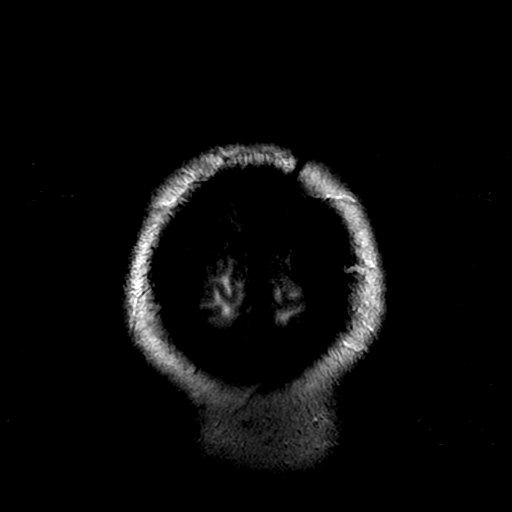

[Series 400: DWI · axial · 3.0mm · 1.09mm/px · z∈[-46,+98]mm · 5 of 49 slices shown (3 of 4)]
[im 1/49]
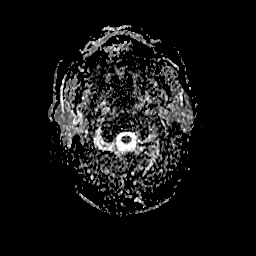
[im 13/49]
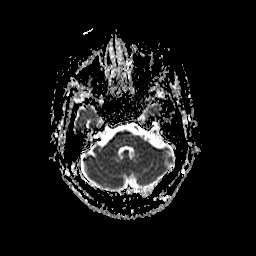
[im 25/49]
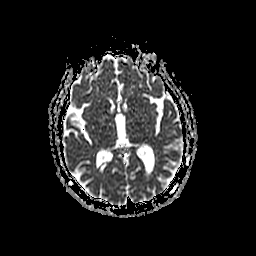
[im 37/49]
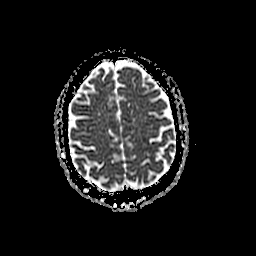
[im 49/49]
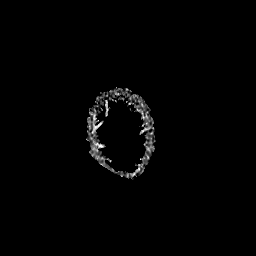

[Series 600: DWI · coronal · 5.0mm · 1.09mm/px · 3 of 33 slices shown (4 of 4)]
[im 1/33]
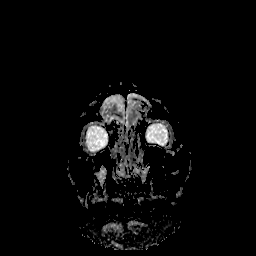
[im 17/33]
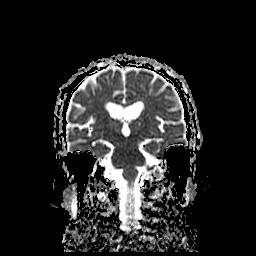
[im 33/33]
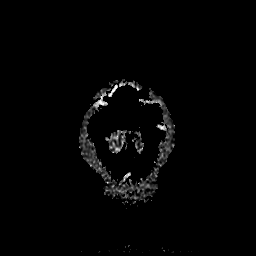

[35 of 48 positions shown; findings below may reference images not displayed]

FINDINGS: Limited visualization of the upper cervical spine demonstrates a
thin band of curvilinear hypointensity centrally in the visualized
cervical spinal cord on the sagittal T1 sequence (series 3, image
12). The axial sequences do not extend through this level, and the
upper cervical spinal cord is only visualized on a single coronal T2
image without abnormality identified. The cerebellar tonsils are
normally positioned.

There is no evidence of acute infarct, mass, midline shift, or
extra-axial fluid collection. A chronic microhemorrhage is noted
along the medial margin of the left caudate head. T2
hyperintensities in the subcortical and periventricular cerebral
white matter bilaterally are nonspecific but compatible with
moderate chronic small vessel ischemic disease. There is mild
cerebral atrophy.

Orbits are unremarkable. Small bilateral maxillary sinus mucous
retention cysts and a small right mastoid effusion are noted. The
distal right vertebral artery flow void is abnormal in appearance.
Other major intracranial vascular flow voids are preserved.
IMPRESSION: 1. No acute infarct.
2. Moderate chronic small vessel ischemic disease.
3. Abnormal distal right vertebral artery which may reflect
diminished flow from a proximal stenosis or occlusion.
4. Small syrinx versus artifact in the upper cervical spinal cord.
Cervical spine MRI suggested for further evaluation.

## 2017-04-17 IMAGING — CT CT HEAD W/O CM
2 series · 15 of 30 positions shown, 17 images · non-contrast
Comparison: 08/29/2007

CLINICAL DATA: Dizzy since waking this morning; history of
hypertension

EXAM:
CT HEAD WITHOUT CONTRAST
TECHNIQUE: Contiguous axial images were obtained from the base of the skull
through the vertex without intravenous contrast.

[Series 2: head without · axial · non-contrast · 0.45mm/px · z∈[-119,+1]mm · 7 of 33 slices shown, 9 images]
[im 5/33  brain]
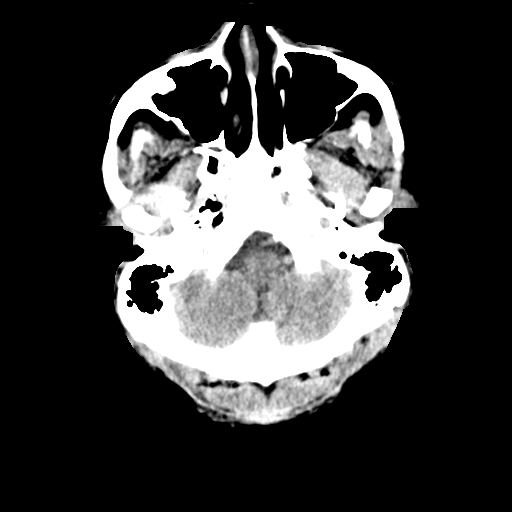
[im 5/33  bone]
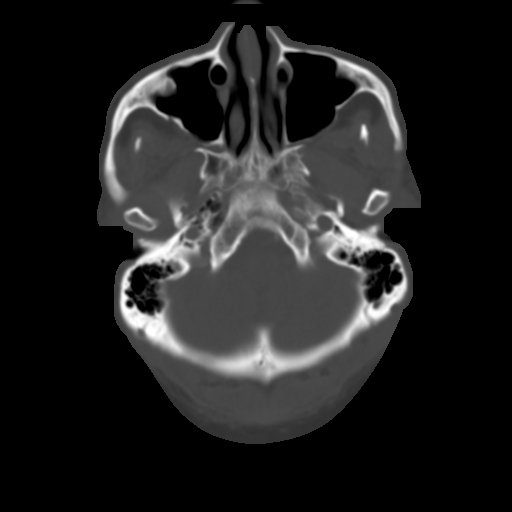
[im 9/33  brain]
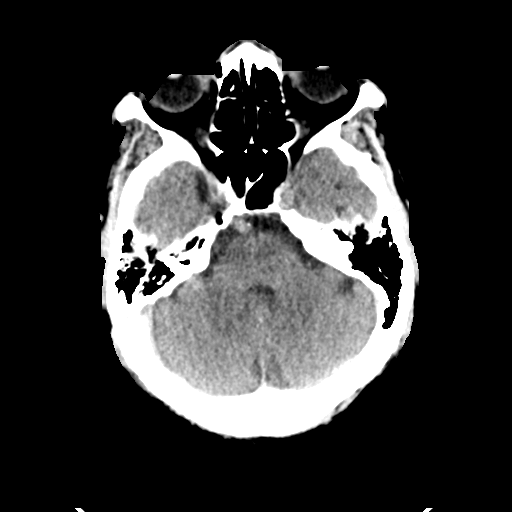
[im 13/33  brain]
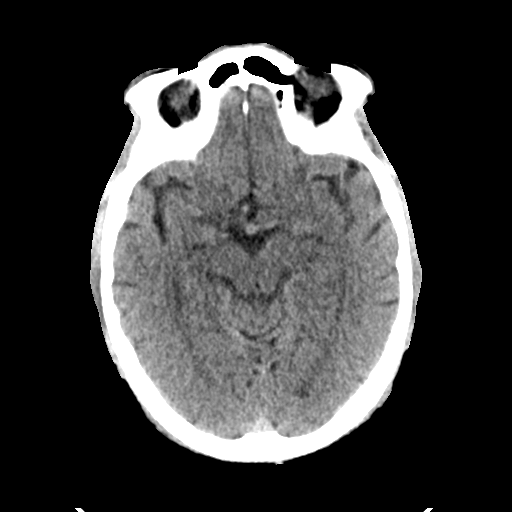
[im 17/33  brain]
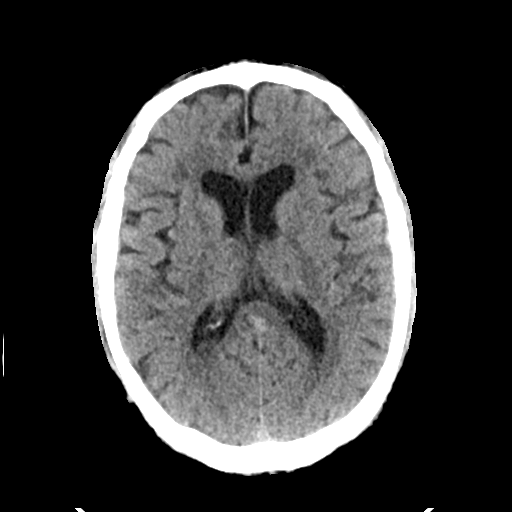
[im 21/33  brain]
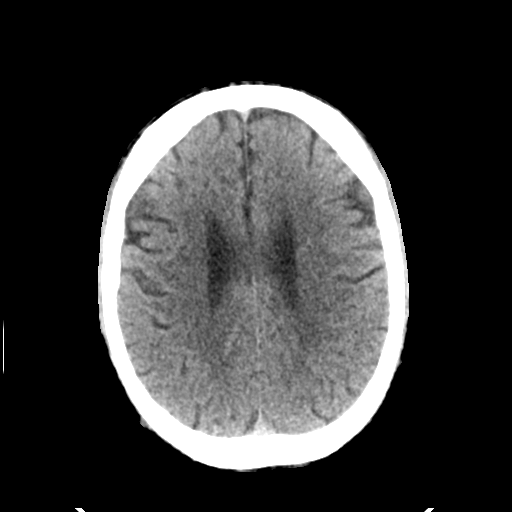
[im 21/33  bone]
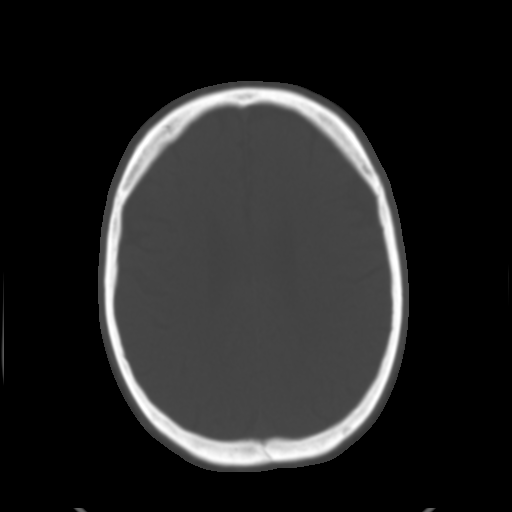
[im 25/33  brain]
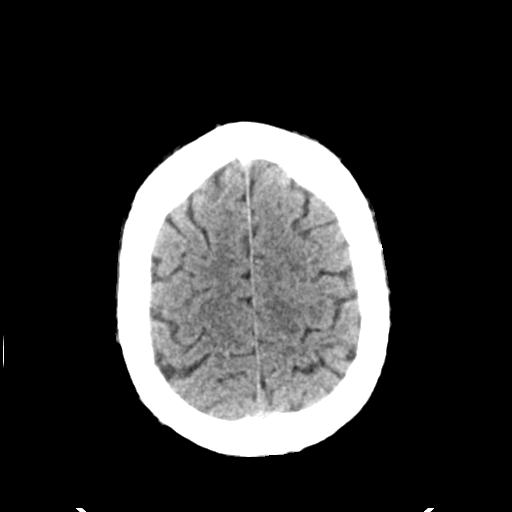
[im 29/33  brain]
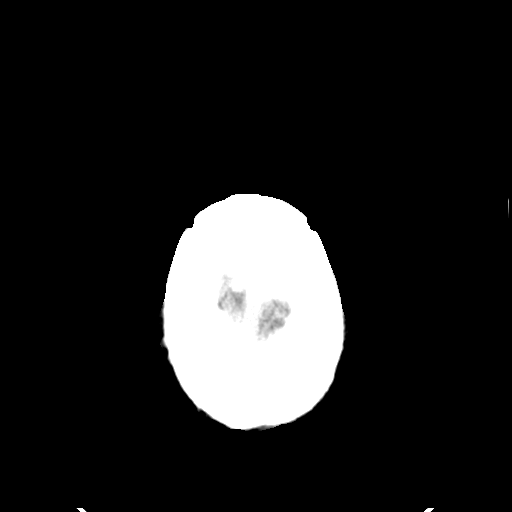

[Series 3: head bone · axial · 0.45mm/px · z∈[-123,+5]mm · 8 of 82 slices shown]
[im 9/82  bone]
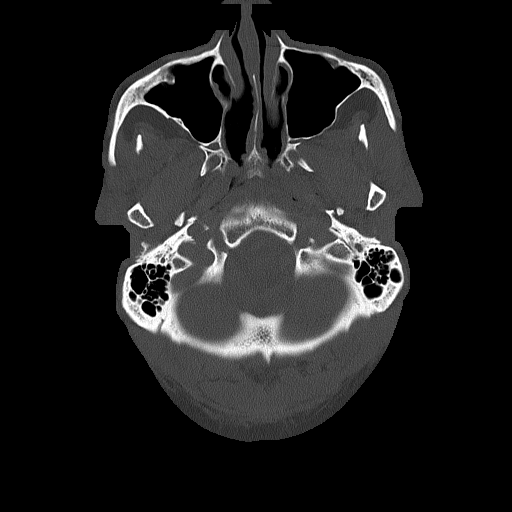
[im 17/82  bone]
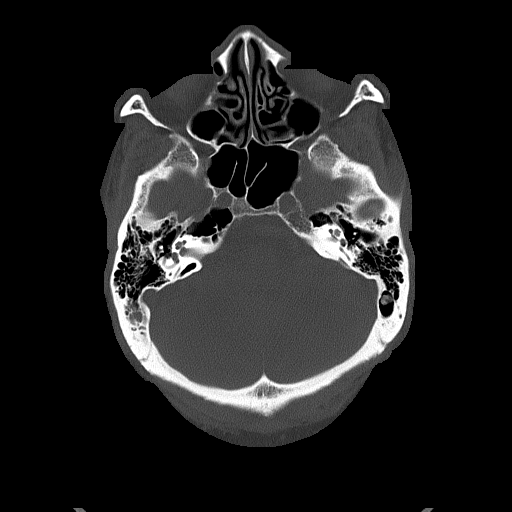
[im 25/82  bone]
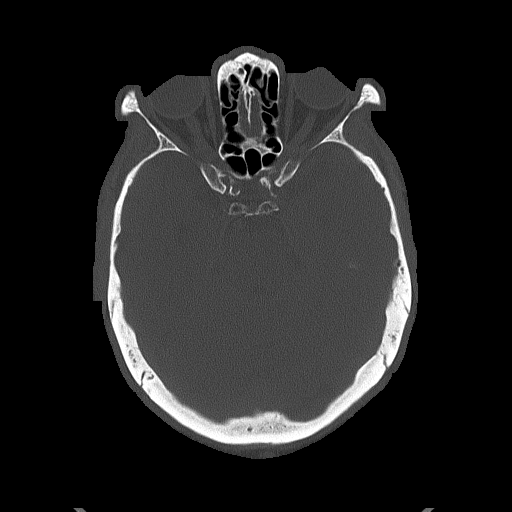
[im 37/82  bone]
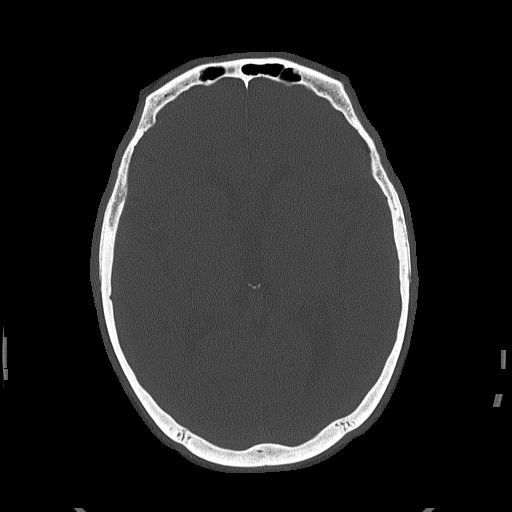
[im 45/82  bone]
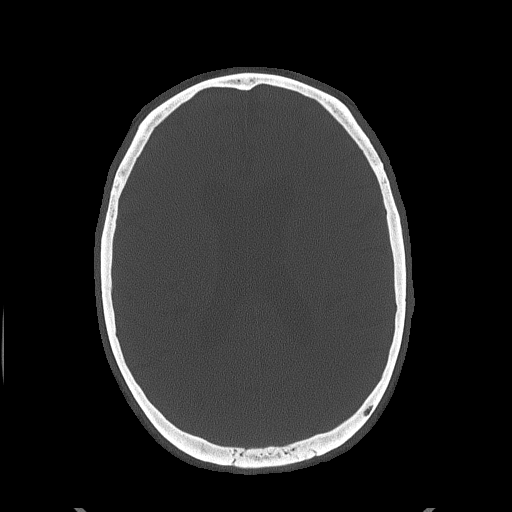
[im 57/82  bone]
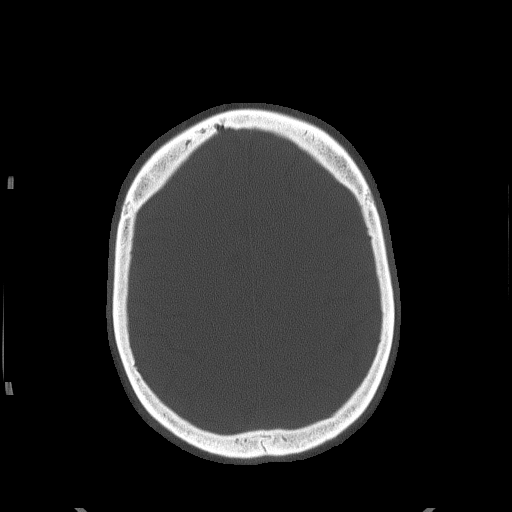
[im 65/82  bone]
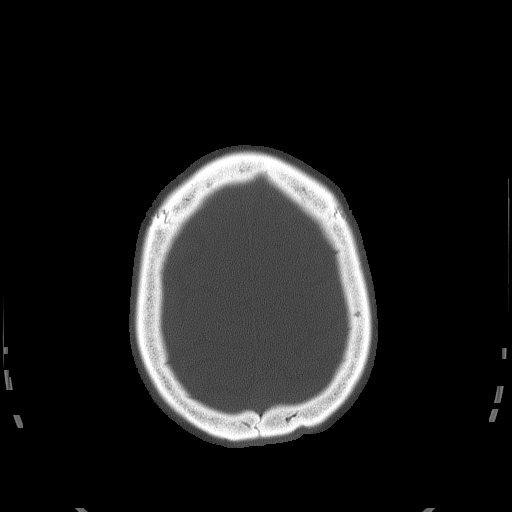
[im 73/82  bone]
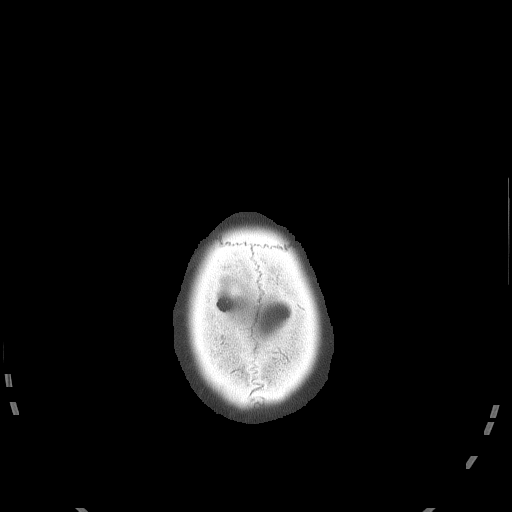

[15 of 30 positions shown; findings below may reference images not displayed]

FINDINGS: Moderate diffuse atrophy as seen on the prior study. Mild low
attenuation in the deep white matter consistent with chronic small
vessel change. No mass or infarct. No hemorrhage or extra-axial
fluid. No hydrocephalus. Small mucous retention cysts or polyps in
both maxillary sinuses. Minimal mucosal thickening ethmoid air cells
bilaterally. Small polyps or retention cysts in the sphenoid
sinuses. Frontal sinuses are clear. Posterior right mastoid air
cells are opacified.
IMPRESSION: Chronic inflammatory change in the sinuses. Age-related involutional
change involving the brain with no acute intracranial abnormality.

## 2017-05-17 ENCOUNTER — Other Ambulatory Visit: Payer: Self-pay | Admitting: Cardiovascular Disease

## 2017-06-05 ENCOUNTER — Other Ambulatory Visit: Payer: Self-pay | Admitting: Cardiovascular Disease

## 2017-09-24 ENCOUNTER — Other Ambulatory Visit (HOSPITAL_COMMUNITY): Payer: Self-pay | Admitting: Gastroenterology

## 2017-09-24 DIAGNOSIS — K760 Fatty (change of) liver, not elsewhere classified: Secondary | ICD-10-CM

## 2017-09-24 DIAGNOSIS — Z8619 Personal history of other infectious and parasitic diseases: Secondary | ICD-10-CM

## 2017-10-01 ENCOUNTER — Ambulatory Visit (HOSPITAL_COMMUNITY)
Admission: RE | Admit: 2017-10-01 | Discharge: 2017-10-01 | Disposition: A | Discharge status: Home / Self Care | Payer: Medicare Other | Source: Ambulatory Visit | Attending: Gastroenterology | Admitting: Gastroenterology

## 2017-10-01 DIAGNOSIS — K760 Fatty (change of) liver, not elsewhere classified: Secondary | ICD-10-CM

## 2017-10-01 DIAGNOSIS — Z8619 Personal history of other infectious and parasitic diseases: Secondary | ICD-10-CM

## 2017-11-13 ENCOUNTER — Other Ambulatory Visit: Payer: Self-pay | Admitting: Cardiovascular Disease

## 2017-11-17 ENCOUNTER — Other Ambulatory Visit: Payer: Self-pay | Admitting: Cardiovascular Disease

## 2017-11-18 NOTE — Telephone Encounter (Signed)
Rx sent to pharmacy   

## 2017-11-24 ENCOUNTER — Other Ambulatory Visit: Payer: Self-pay | Admitting: Cardiovascular Disease

## 2017-11-28 ENCOUNTER — Other Ambulatory Visit: Payer: Self-pay | Admitting: Physician Assistant

## 2017-11-28 NOTE — Telephone Encounter (Signed)
Rx request sent to pharmacy.  

## 2017-12-03 ENCOUNTER — Other Ambulatory Visit: Payer: Self-pay | Admitting: Cardiovascular Disease

## 2017-12-03 NOTE — Telephone Encounter (Signed)
This is a Dr. Tresa Endo patient.  Patient is due for a one year follow up, 10/09/16 LOV.  Pharmacy is requesting refill on this medication.  Is this okay to fill?

## 2018-02-13 ENCOUNTER — Other Ambulatory Visit: Payer: Self-pay | Admitting: Cardiovascular Disease

## 2018-02-17 ENCOUNTER — Ambulatory Visit (INDEPENDENT_AMBULATORY_CARE_PROVIDER_SITE_OTHER): Payer: Medicare Other | Admitting: Cardiovascular Disease

## 2018-02-17 ENCOUNTER — Encounter: Payer: Self-pay | Admitting: Cardiovascular Disease

## 2018-02-17 VITALS — BP 170/118 | HR 55 | Ht 66.0 in | Wt 190.0 lb

## 2018-02-17 DIAGNOSIS — E785 Hyperlipidemia, unspecified: Secondary | ICD-10-CM | POA: Diagnosis not present

## 2018-02-17 DIAGNOSIS — I5032 Chronic diastolic (congestive) heart failure: Secondary | ICD-10-CM | POA: Diagnosis not present

## 2018-02-17 DIAGNOSIS — I10 Essential (primary) hypertension: Secondary | ICD-10-CM | POA: Diagnosis not present

## 2018-02-17 DIAGNOSIS — Z79899 Other long term (current) drug therapy: Secondary | ICD-10-CM

## 2018-02-17 DIAGNOSIS — I428 Other cardiomyopathies: Secondary | ICD-10-CM

## 2018-02-17 DIAGNOSIS — Z8673 Personal history of transient ischemic attack (TIA), and cerebral infarction without residual deficits: Secondary | ICD-10-CM

## 2018-02-17 MED ORDER — SPIRONOLACTONE 25 MG PO TABS: 25.0000 mg | ORAL_TABLET | Freq: Two times a day (BID) | ORAL | 2 refills | Status: DC

## 2018-02-17 MED ORDER — HYDRALAZINE HCL 50 MG PO TABS: 50.0000 mg | ORAL_TABLET | Freq: Three times a day (TID) | ORAL | 3 refills | Status: DC

## 2018-02-17 NOTE — Progress Notes (Signed)
Patient ID: Thomas Bullock, male   DOB: 01-Jun-1950, 67 y.o.   MRN: [id number]    Primary MD: Dr. Jarold Song  HPI: Thomas Bullock is a 67 y.o. male with a history of a nonischemic cardiomyopathy who presents to the office for a 21 month cardiology evaluation.  Thomas Bullock a long-standing history of hypertension. In May 2013 after being off his medications for some time he presented to [facility] in the setting of acute pulmonary edema. He was found to have a nonischemic cardiomyopathy with an ejection fraction of 20-25% and minimal CAD with 20% narrowing in the LAD noted at catheterization.With aggressive medical therapy and gradual titration of his medications  LV function has improved. An echo Doppler study in September 2013 showed an ejection fraction of 45% and an echo Doppler study on 01/26/2014 revealed complete normalization of LV function with an ejection fraction of 60-65%.  There was grade 1 diastolic dysfunction.  He had a determine LV filling pressure.  PA pressure was normal at 11 mm.  His  ascending aorta was moderately dilated up to 4.7 cm.   He was hospitalized in April 2017 and was felt to have a TIA.  He presented with vertigo and double vision.  An MRI of the brain did not show definitive evidence of an acute stroke.  There was some evidence of low flow.  The right vertebral artery, which was confirmed by carotid Doppler study.  He underwent a 2-D echo Doppler study on 09/06/2015 which showed an EF of 55-60% with grade 1 diastolic dysfunction.  There was mild-to-moderate aortic insufficiency.  There was no evidence for cardiac source of TIA.  He was subtotally seen in neurology follow-up by Dr. Jannifer Franklin in June.  Presently he denies episodes of chest pain.  He has continued to do well the he denies any significant shortness of breath but still notes  mild shortness of breath with activity. He denies   palpitations, presyncope  syncope or arrhythmia.  He admits to occasional episodes of  indigestion and has been taking over-the-counter Tums for this with plus minus benefit.  He denies any edema.  He denies PND, orthopnea.  I last saw Thomas Bullock in January 2018 at which time he was doing well without chest pain shortness of breath or palpitations.  At that time I further titrated Spironolactone to 25 mg twice a day, decrease furosemide to 20 mg daily and recommended he reduce his aspirin down to 81 mg.  Over the past several years he has been without chest pain or significant swelling.  He has not had a recent echo.  Most recently he has only been taking the Spironolactone 12.5 mg twice a day and he is continued to be on isosorbide/hydralazine 20/37.5 3 times daily he is continued to be on digoxin 0.0625 mg.  He presents for evaluation.  Past Medical History:  Diagnosis Date  . Acute pulmonary edema ([place]) 10/04/2011  . Coronary artery disease   . GERD (gastroesophageal reflux disease) 10/04/2011  . Hypertension   . LVH (left ventricular hypertrophy) 10/04/2011  . Systolic CHF ([place]) 06/13/2534   Hospitalized for flash pulmonary edema in June 2012 due to malignant HTN.   EF was 20-25% with severe global hypokinesis. left heart cath which revealed an LAD with 20% smooth narrowing but otherwise normal coronaries. On ace inhibitor, spironalactone, carvedilol. Diuresed in hospital.  Life Vest was ordered and fitted by [employer] to wear until September.       Past Surgical History:  Procedure Laterality Date  . CHOLECYSTECTOMY    . CHOLECYSTECTOMY, LAPAROSCOPIC  2006  . DENTAL SURGERY    . LEFT HEART CATHETERIZATION WITH CORONARY ANGIOGRAM N/A 10/05/2011   Procedure: LEFT HEART CATHETERIZATION WITH CORONARY ANGIOGRAM;  Surgeon: Troy Sine, MD;  Location: [facility] CATH LAB;  Service: Cardiovascular;  Laterality: N/A;  . RIGHT HEART CATHETERIZATION  10/05/2011   Procedure: RIGHT HEART CATH;  Surgeon: Troy Sine, MD;  Location: [facility] CATH LAB;  Service: Cardiovascular;;    No Known Allergies  Current  Outpatient Medications  Medication Sig Dispense Refill  . aspirin EC 81 MG tablet Take 1 tablet (81 mg total) by mouth daily. 90 tablet 3  . atorvastatin (LIPITOR) 10 MG tablet Take 1 tablet (10 mg total) by mouth daily at 6 PM. 30 tablet 12  . carvedilol (COREG) 25 MG tablet TAKE 1 TABLET BY MOUTH TWICE DAILY WITH  A  MEAL 180 tablet 3  . digoxin (LANOXIN) 0.125 MG tablet TAKE 1/2 (ONE-HALF) TABLET BY MOUTH ONCE DAILY 45 tablet 0  . furosemide (LASIX) 40 MG tablet Take 0.5 tablets (20 mg total) by mouth daily. (Patient taking differently: Take 20 mg by mouth daily. In afternoons) 30 tablet 5  . furosemide (LASIX) 40 MG tablet TAKE 1 TABLET BY MOUTH ONCE DAILY (Patient taking differently: In mornings.) 90 tablet 1  . hydrALAZINE (APRESOLINE) 50 MG tablet Take 1 tablet (50 mg total) by mouth 3 (three) times daily. 270 tablet 3  . isosorbide dinitrate (ISORDIL) 20 MG tablet TAKE 1 TABLET BY MOUTH THREE TIMES DAILY 270 tablet 1  . pantoprazole (PROTONIX) 40 MG tablet TAKE 1 TABLET BY MOUTH ONCE DAILY 90 tablet 1  . spironolactone (ALDACTONE) 25 MG tablet Take 1 tablet (25 mg total) by mouth 2 (two) times daily. 90 tablet 2  . TRAVATAN Z 0.004 % SOLN ophthalmic solution      No current facility-administered medications for this visit.     Socially he is single. He is retired. He works in [employer]. Completed 12th grade education. There is no tobacco history. He does drink occasional alcohol.  ROS General: Negative; No fevers, chills, or night sweats;  HEENT: Negative; No changes in vision or hearing, sinus congestion, difficulty swallowing Pulmonary: Negative; No cough, wheezing, shortness of breath, hemoptysis Cardiovascular: See history of present illness; No chest pain, presyncope, syncope, palpitations GI: Negative; No nausea, vomiting, diarrhea, or abdominal pain GU: Negative; No dysuria, hematuria, or difficulty voiding Musculoskeletal: Negative; no myalgias, joint pain, or  weakness Hematologic/Oncology: Negative; no easy bruising, bleeding Endocrine: Negative; no heat/cold intolerance; no diabetes Neuro: Negative; no changes in balance, headaches Skin: Negative; No rashes or skin lesions Psychiatric: Negative; No behavioral problems, depression Sleep: Negative; No snoring, daytime sleepiness, hypersomnolence, bruxism, restless legs, hypnogognic hallucinations, no cataplexy Other comprehensive 14 point system review is negative.   PE BP (!) 170/118   Pulse (!) 55   Ht '5\' 6"'  (1.676 m)   Wt 190 lb (86.2 kg)   BMI 30.67 kg/m    Repeat blood pressure by me was 170/100  Wt Readings from Last 3 Encounters:  02/17/18 190 lb (86.2 kg)  10/09/16 182 lb 3.2 oz (82.6 kg)  08/01/16 185 lb (83.9 kg)   General: Alert, oriented, no distress.  Skin: normal turgor, no rashes, warm and dry HEENT: Normocephalic, atraumatic. Pupils equal round and reactive to light; sclera anicteric; extraocular muscles intact; Fundi Nose without nasal septal hypertrophy Mouth/Parynx benign; Mallinpatti scale 3 Neck: No JVD,  no carotid bruits; normal carotid upstroke Lungs: clear to ausculatation and percussion; no wheezing or rales Chest wall: without tenderness to palpitation Heart: PMI not displaced, RRR, s1 s2 normal, 1/6 systolic murmur, no diastolic murmur, no rubs, gallops, thrills, or heaves Abdomen: soft, nontender; no hepatosplenomehaly, BS+; abdominal aorta nontender and not dilated by palpation. Back: no CVA tenderness Pulses 2+ Musculoskeletal: full range of motion, normal strength, no joint deformities Extremities: no clubbing cyanosis or edema, Homan's sign negative  Neurologic: grossly nonfocal; Cranial nerves grossly wnl Psychologic: Normal mood and affect   ECG (independently read by me): Sinus bradycardia 55 bpm.  First-degree AV block with a PR interval 218 ms.  QS complex V1 V2.  January 2018 ECG (independently read by me): Normal sinus rhythm at 60 bpm.   Milliseconds.  No ST segment changes.  July 2017 ECG (independently read by me): Normal sinus rhythm with first-degree AV block.  Mild T wave abnormality in lead 3  October ECG (independently read by me): normal sinus rhythm at 65 bpm..  Probable left posterior hemiblock.  Probably progression V1 through V4.  May 2016 ECG (independently read by me): Normal sinus rhythm at 75 bpm.  First-degree AV block with a PR interval 206 ms.  T-wave inversion in leads 3 and mildly in aVF.  September 2015 ECG (independently read by me): Normal sinus rhythm with one isolated PVC.  QTc interval 397 ms.  PR interval 190 ms.  Prior January 2015 ECG (independently read by me): Normal sinus rhythm at 60 beats per minute. Normal intervals.  Previous ECG from 12/04/2012 : Sinus bradycardia 53 beats per minute period. PR interval 196; QTc interval 377 ms  LABS: BMP Latest Ref Rng & Units 02/17/2018 06/18/2016 04/17/2016  Glucose 65 - 99 mg/dL 79 104(H) 91  BUN 8 - 27 mg/dL '17 17 15  ' Creatinine 0.76 - 1.27 mg/dL 1.28(H) 1.34(H) 1.39(H)  BUN/Creat Ratio 10 - 24 13 - -  Sodium 134 - 144 mmol/L 138 135 138  Potassium 3.5 - 5.2 mmol/L 4.6 4.3 4.3  Chloride 96 - 106 mmol/L 99 105 104  CO2 20 - 29 mmol/L '25 24 25  ' Calcium 8.6 - 10.2 mg/dL 10.0 9.1 9.3   Hepatic Function Latest Ref Rng & Units 02/17/2018 10/05/2016 09/05/2016  Total Protein 6.0 - 8.5 g/dL 8.0 7.0 7.4  Albumin 3.6 - 4.8 g/dL 4.4 3.6 3.7  AST 0 - 40 IU/L 30 59(H) 71(H)  ALT 0 - 44 IU/L 21 49(H) 58(H)  Alk Phosphatase 39 - 117 IU/L 113 88 74  Total Bilirubin 0.0 - 1.2 mg/dL 0.5 0.7 1.2  Bilirubin, Direct <=0.2 mg/dL - 0.2 0.5(H)   CBC Latest Ref Rng & Units 02/17/2018 06/18/2016 09/04/2015  WBC 3.4 - 10.8 x10E3/uL 7.8 6.2 5.5  Hemoglobin 13.0 - 17.7 g/dL 13.8 14.2 13.4  Hematocrit 37.5 - 51.0 % 42.6 42.1 39.6  Platelets 150 - 450 x10E3/uL 247 223 189   Lab Results  Component Value Date   TSH 4.410 02/17/2018   Lipid Panel     Component Value  Date/Time   CHOL 207 (H) 02/17/2018 1231   TRIG 174 (H) 02/17/2018 1231   HDL 36 (L) 02/17/2018 1231   CHOLHDL 5.8 (H) 02/17/2018 1231   CHOLHDL 3.9 09/05/2016 1335   VLDL 30 09/05/2016 1335   LDLCALC 136 (H) 02/17/2018 1231   BNP    Component Value Date/Time   PROBNP 1,662.0 (H) 10/06/2011 7944     RADIOLOGY: No results found.  IMPRESSION:  1. Essential hypertension   2. Chronic diastolic heart failure ([place])   3. NICM (nonischemic cardiomyopathy) ([place])   4. Hyperlipidemia LDL goal <70   5. Medication management   6. History of TIA (transient ischemic attack)     ASSESSMENT AND PLAN: Thomas Bullock is a 67 year old African-American male who has a long-standing history of hypertension and a history of a nonischemic cardiomyopathy who presented in pulmonary edema after he had stopped his blood pressure medications for several months in May 2013. Since that time on medical therapy his ejection fraction improved from 20% to  60-65% as noted on his echo of 01/26/2014.  He has moderate dilatation of his ascending aorta.  Pulmonary pressures are normal.  He is unaware of any arrhythmia.  He developed vertigo and diplopia and was felt to have a TIA leading to his hospitalization. His last  echo Doppler study in 2017 reconfirmed normal systolic function with grade 1 diastolic dysfunction, mild to moderate AR.  At that time there was no evidence for any vegetation or cardiac source of emboli.  On exam today, his blood pressure is elevated.  I am recommending he increase Spironolactone to 25 mg twice a day.  I am also further titrating hydralazine to 50 mg every 8 hours.  I am scheduling Thomas Bullock for follow-up echo Doppler study to reassess his LV function.  We discussed sodium restriction.  He is not having anginal symptomatology.  If LV function remains normal I would discontinue digoxin.  He continues to be on low-dose atorvastatin 10 mg.  He has not had recent laboratory and this will be obtained  with target LDL less than 70.  He will follow-up with Almyra Deforest in 6 weeks and see me in 3 to 4 months.   his blood pressure today is mildly increased.  I discussed with Thomas Bullock the new hypertensive guidelines.  I am recommending further titration of spironolactone from 12.5 mm twice a day to 25 mg twice a day.  I will decrease furosemide to 20 g daily.  I have recommended he reduce his aspirin from 325 milligrams to 81 mg. He is on Lanoxin at 0.0625 mg as well as carvedilol 12.5 mm twice a day.  He now is on atorvastatin 40 mg daily with target LDL less than 70.  He does have a very soft left carotid bruit on exam which is unchanged.  His GERD is controlled with Protonix.  He will undergo repeat blood work in 2 weeks.  I will see Thomas Bullock in the office in 4 months for reevaluation.    Time spent: 25 minutes  Troy Sine, MD, [facility]  02/19/2018 6:52 PM   This

## 2018-02-17 NOTE — Patient Instructions (Addendum)
Medication Instructions:  INCREASE spironolactone to 25 mg (1 tablet) two times daily INCREASE hydralazine to 50 mg (1 tablet)  three times daily (every 8 hours)  If you need a refill on your cardiac medications before your next appointment, please call your pharmacy.   Lab work: Today (CMET, CBC, Lipid, TSH) If you have labs (blood work) drawn today and your tests are completely normal, you will receive your results only by: Marland Kitchen MyChart Message (if you have MyChart) OR . A paper copy in the mail If you have any lab test that is abnormal or we need to change your treatment, we will call you to review the results.  Testing/Procedures: Your physician has requested that you have an echocardiogram. Echocardiography is a painless test that uses sound waves to create images of your heart. It provides your doctor with information about the size and shape of your heart and how well your heart's chambers and valves are working. This procedure takes approximately one hour. There are no restrictions for this procedure.  This will be done at our [facility] location:  [address]  Follow-Up: 6 weeks with Azalee Course PA and 4 months with Dr. Tresa Endo

## 2018-02-18 LAB — COMPREHENSIVE METABOLIC PANEL
A/G RATIO: 1.2 (ref 1.2–2.2)
ALBUMIN: 4.4 g/dL (ref 3.6–4.8)
ALK PHOS: 113 IU/L (ref 39–117)
ALT: 21 IU/L (ref 0–44)
AST: 30 IU/L (ref 0–40)
BILIRUBIN TOTAL: 0.5 mg/dL (ref 0.0–1.2)
BUN / CREAT RATIO: 13 (ref 10–24)
BUN: 17 mg/dL (ref 8–27)
CO2: 25 mmol/L (ref 20–29)
CREATININE: 1.28 mg/dL — AB (ref 0.76–1.27)
Calcium: 10 mg/dL (ref 8.6–10.2)
Chloride: 99 mmol/L (ref 96–106)
GFR calc Af Amer: 67 mL/min/{1.73_m2} (ref >59)
GFR calc non Af Amer: 58 mL/min/{1.73_m2} — ABNORMAL LOW (ref >59)
GLOBULIN, TOTAL: 3.6 g/dL (ref 1.5–4.5)
Glucose: 79 mg/dL (ref 65–99)
Potassium: 4.6 mmol/L (ref 3.5–5.2)
Sodium: 138 mmol/L (ref 134–144)
Total Protein: 8 g/dL (ref 6.0–8.5)

## 2018-02-18 LAB — LIPID PANEL
CHOL/HDL RATIO: 5.8 ratio — AB (ref 0.0–5.0)
Cholesterol, Total: 207 mg/dL — ABNORMAL HIGH (ref 100–199)
HDL: 36 mg/dL — AB (ref >39)
LDL Calculated: 136 mg/dL — ABNORMAL HIGH (ref 0–99)
Triglycerides: 174 mg/dL — ABNORMAL HIGH (ref 0–149)
VLDL CHOLESTEROL CAL: 35 mg/dL (ref 5–40)

## 2018-02-18 LAB — CBC
HEMATOCRIT: 42.6 % (ref 37.5–51.0)
Hemoglobin: 13.8 g/dL (ref 13.0–17.7)
MCH: 24.6 pg — ABNORMAL LOW (ref 26.6–33.0)
MCHC: 32.4 g/dL (ref 31.5–35.7)
MCV: 76 fL — AB (ref 79–97)
Platelets: 247 10*3/uL (ref 150–450)
RBC: 5.61 x10E6/uL (ref 4.14–5.80)
RDW: 15.8 % — ABNORMAL HIGH (ref 12.3–15.4)
WBC: 7.8 10*3/uL (ref 3.4–10.8)

## 2018-02-18 LAB — TSH: TSH: 4.41 u[IU]/mL (ref 0.450–4.500)

## 2018-02-19 ENCOUNTER — Encounter: Payer: Self-pay | Admitting: Cardiovascular Disease

## 2018-02-26 ENCOUNTER — Other Ambulatory Visit: Payer: Self-pay

## 2018-02-26 DIAGNOSIS — E785 Hyperlipidemia, unspecified: Secondary | ICD-10-CM

## 2018-02-26 DIAGNOSIS — Z79899 Other long term (current) drug therapy: Secondary | ICD-10-CM

## 2018-02-26 NOTE — Progress Notes (Signed)
Notes recorded by Lennette Bihari, MD on 02/24/2018 at 7:46 AM EDT Renal function slightly improved; normal hemoglobin. Microcytic indices. Lipids abnormal; increase atorvastatin to 40 mg and follow-up lipid studies in 3 months

## 2018-02-27 ENCOUNTER — Telehealth: Payer: Self-pay | Admitting: Nurse Practitioner

## 2018-02-27 ENCOUNTER — Ambulatory Visit (HOSPITAL_COMMUNITY): Payer: Medicare Other | Attending: Internal Medicine

## 2018-02-27 ENCOUNTER — Other Ambulatory Visit: Payer: Self-pay

## 2018-02-27 DIAGNOSIS — I5032 Chronic diastolic (congestive) heart failure: Secondary | ICD-10-CM | POA: Diagnosis not present

## 2018-02-27 DIAGNOSIS — R7989 Other specified abnormal findings of blood chemistry: Secondary | ICD-10-CM

## 2018-02-27 DIAGNOSIS — I712 Thoracic aortic aneurysm, without rupture, unspecified: Secondary | ICD-10-CM

## 2018-02-27 DIAGNOSIS — I10 Essential (primary) hypertension: Secondary | ICD-10-CM | POA: Diagnosis not present

## 2018-02-27 DIAGNOSIS — I77810 Thoracic aortic ectasia: Secondary | ICD-10-CM

## 2018-02-27 DIAGNOSIS — R945 Abnormal results of liver function studies: Secondary | ICD-10-CM

## 2018-02-27 DIAGNOSIS — I428 Other cardiomyopathies: Secondary | ICD-10-CM | POA: Insufficient documentation

## 2018-02-27 NOTE — Telephone Encounter (Signed)
Patient was having echo at [facility] office today and Tammie, echo sonographer came to DOD pod to discuss elevated BP reading per electric BP cuff with Dr. Elease Hashimoto. Dr. Elease Hashimoto was with a patient so I manually checked patient' BP which was 150/100 mmHg in right arm and 160/98 mmHg in left arm. Patient reports he took his medications as prescribed this morning about 0630. He was seen by Dr. Tresa Endo on 10/14 and BP medications were increased. Patient denies complaints. I reviewed findings with Dr. Elease Hashimoto who advised that I send a message to Dr. Tresa Endo for his awareness and appropriate follow-up. I advised Tammie of Dr. Harvie Bridge advice and asked her to remind patient to take his mid-day dose of Hydralazine when he gets home. She thanked me for our assistance.

## 2018-02-27 NOTE — Telephone Encounter (Signed)
Will arrange for patient to see the pharmacist next week for further blood pressure titration

## 2018-03-05 MED ORDER — ATORVASTATIN CALCIUM 40 MG PO TABS: 40.0000 mg | ORAL_TABLET | Freq: Every day | ORAL | 3 refills | Status: DC

## 2018-03-05 NOTE — Progress Notes (Signed)
03/06/2018 Thomas Bullock 06/23/1950 [id number]   HPI:  Thomas Bullock is a 67 y.o. male patient of Dr Tresa Endo, with a PMH below who presents today for hypertension clinic evaluation.  In addition to hypertension, his medical history is significant for non-ischemic cardiomyopathy, LVH, prior TIA, hyperlipidemia, CKD (stage 3), and GERD.  He saw Dr. Tresa Endo on Oct 14 and was found to have a BP of 170/118 (HR 55).  His spironolactone was increased from 12.5 to 25 mg daily.  Ten days later he was having an echocardiogram and was noted to have a BP of 150/100 (R) and 160/98 (L).   The [facility] office DOD was notified and it was suggested he move his appointment with CV[facility] to sooner than mid November.    Today the patient is here for follow up.  He admits to missing evening meds (isosorbide and hydralazine), probably once or twice weekly, if he is with friends or eating at another home.  Otherwise he thinks he does a good job at taking medications.  He does feel that he takes "a lot" of medications, and admits to feeling sleepy mid-morning, but I don't think that is related to taking his medications.   He has some trouble with the names of medications, and follows directions on the bottles, as he doesn't always know the purpose of each medication.   Blood Pressure Goal:  130/80  Current Medications:  Spironolactone 25 mg bid  Carvedilol 25 mg bid  hydralazine 50 mg tid  Isosorbide dinitrate 20 mg tid  Family Hx:  Mother died from kidney failure  Father died from MI in his 41's  Siblings with no know heart disease  Social Hx:  No tobacco; only social alcohol, about 1-2 times per month; likes pepsi, but tries not to drink daily;   Diet:  Mostly eats at home; (lives with brother); likes fruit with breakfast; some vegetables  Exercise:  Walks regularly, mostly in daily life, not for exercise  Home BP readings:  No home meter   Intolerances:   NKDA  Labs:  02/17/2018:  Na  138, K 4.6, Glu 79, BUN 17, SCr 1.28 (before dose increase on spironolactone)   Wt Readings from Last 3 Encounters:  02/17/18 190 lb (86.2 kg)  10/09/16 182 lb 3.2 oz (82.6 kg)  08/01/16 185 lb (83.9 kg)   BP Readings from Last 3 Encounters:  03/06/18 (!) 142/100  02/17/18 (!) 170/118  10/09/16 (!) 154/74   Pulse Readings from Last 3 Encounters:  03/06/18 66  02/17/18 (!) 55  10/09/16 73    Current Outpatient Medications  Medication Sig Dispense Refill  . amLODipine (NORVASC) 5 MG tablet Take 1 tablet (5 mg total) by mouth daily. 30 tablet 5  . aspirin EC 81 MG tablet Take 1 tablet (81 mg total) by mouth daily. 90 tablet 3  . atorvastatin (LIPITOR) 40 MG tablet Take 1 tablet (40 mg total) by mouth daily at 6 PM. 90 tablet 3  . carvedilol (COREG) 25 MG tablet TAKE 1 TABLET BY MOUTH TWICE DAILY WITH  A  MEAL 180 tablet 3  . digoxin (LANOXIN) 0.125 MG tablet TAKE 1/2 (ONE-HALF) TABLET BY MOUTH ONCE DAILY 45 tablet 0  . furosemide (LASIX) 40 MG tablet Take 0.5 tablets (20 mg total) by mouth daily. (Patient taking differently: Take 20 mg by mouth daily. In afternoons) 30 tablet 5  . furosemide (LASIX) 40 MG tablet TAKE 1 TABLET BY MOUTH ONCE DAILY (Patient  taking differently: In mornings.) 90 tablet 1  . hydrALAZINE (APRESOLINE) 50 MG tablet Take 1 tablet (50 mg total) by mouth 3 (three) times daily. 270 tablet 3  . isosorbide dinitrate (ISORDIL) 20 MG tablet TAKE 1 TABLET BY MOUTH THREE TIMES DAILY 270 tablet 1  . pantoprazole (PROTONIX) 40 MG tablet TAKE 1 TABLET BY MOUTH ONCE DAILY 90 tablet 1  . spironolactone (ALDACTONE) 25 MG tablet Take 1 tablet (25 mg total) by mouth 2 (two) times daily. 90 tablet 2  . TRAVATAN Z 0.004 % SOLN ophthalmic solution      No current facility-administered medications for this visit.     No Known Allergies  Past Medical History:  Diagnosis Date  . Acute pulmonary edema ([facility]) 10/04/2011  . Coronary artery disease   . GERD (gastroesophageal reflux  disease) 10/04/2011  . Hypertension   . LVH (left ventricular hypertrophy) 10/04/2011  . Systolic CHF ([facility]) 11/07/2011   Hospitalized for flash pulmonary edema in June 2012 due to malignant HTN.   EF was 20-25% with severe global hypokinesis. left heart cath which revealed an LAD with 20% smooth narrowing but otherwise normal coronaries. On ace inhibitor, spironalactone, carvedilol. Diuresed in hospital.  Life Vest was ordered and fitted by [employer] to wear until September.       Blood pressure (!) 142/100, pulse 66. R 152/100, L 142/102 HR 66  Hypertension Patient with hypertension, more diastolic than systolic, although both tend to run high.  Will start him on amlodipine 5 mg once daily.  Suggested that he take this every night with bedtime medications.  He also will need to have a BMET due to recent increase in sprionolactone, but states he will come in next week for that.  He is scheduled to see [place] in about 2 weeks, but will cancel that, as he is also scheduled to see Azalee Course in about 4 weeks.  This should give the amlodipine time to work.     Phillips Hay PharmD CPP [facility] [address] [place], [place] [address] [phone]

## 2018-03-05 NOTE — Telephone Encounter (Signed)
Spoke to patient, he does not take BP at home, does not have BP cuff.  Patient rescheduled to see pharmD tomorrow 10/31 for BP follow up.    Echo results reviewed with patient, he is aware of recommendations and verbalized understanding.    Order placed for CT-will schedule tomorrow after appt.     Also patient states he needs new rx for Lipitor-he is out and was suppose to increase this does to 40 mg.  New rx sent to pharmacy.

## 2018-03-06 ENCOUNTER — Ambulatory Visit (INDEPENDENT_AMBULATORY_CARE_PROVIDER_SITE_OTHER): Payer: Medicare Other | Admitting: Pharmacist Clinician (PhC)/ Clinical Pharmacy Specialist

## 2018-03-06 VITALS — BP 142/100 | HR 66

## 2018-03-06 DIAGNOSIS — I10 Essential (primary) hypertension: Secondary | ICD-10-CM | POA: Diagnosis not present

## 2018-03-06 MED ORDER — AMLODIPINE BESYLATE 5 MG PO TABS: 5.0000 mg | ORAL_TABLET | Freq: Every day | ORAL | 5 refills | Status: DC

## 2018-03-06 NOTE — Patient Instructions (Signed)
Return for a a follow up appointment in 1 month  Pick up the atorvastatin 40 mg and amlodipine 5 mg at [facility]  Go to the lab today for blood work  Your blood pressure today is 142/102    Take your BP meds as follows:  AM:  Spironolactone 25 mg; hydralazine 50 mg, isosorbide 20 mg, carvedilol 25 mg  Dinner:  hydralazine 50 mg, isosorbide 20 mg  Bedtime:  Spironolactone 25 mg; hydralazine 50 mg, isosorbide 20 mg, carvedilol 25 mg, amlodipine 5 mg  Exercise as you're able, try to walk approximately 30 minutes per day.  Keep salt intake to a minimum, especially watch canned and prepared boxed foods.  Eat more fresh fruits and vegetables and fewer canned items.  Avoid eating in fast food restaurants.

## 2018-03-06 NOTE — Assessment & Plan Note (Signed)
Patient with hypertension, more diastolic than systolic, although both tend to run high.  Will start him on amlodipine 5 mg once daily.  Suggested that he take this every night with bedtime medications.  He also will need to have a BMET due to recent increase in sprionolactone, but states he will come in next week for that.  He is scheduled to see [place] in about 2 weeks, but will cancel that, as he is also scheduled to see Azalee Course in about 4 weeks.  This should give the amlodipine time to work.

## 2018-03-10 LAB — BASIC METABOLIC PANEL
BUN / CREAT RATIO: 10 (ref 10–24)
BUN: 15 mg/dL (ref 8–27)
CHLORIDE: 96 mmol/L (ref 96–106)
CO2: 24 mmol/L (ref 20–29)
CREATININE: 1.56 mg/dL — AB (ref 0.76–1.27)
Calcium: 10.1 mg/dL (ref 8.6–10.2)
GFR calc Af Amer: 52 mL/min/{1.73_m2} — ABNORMAL LOW (ref >59)
GFR calc non Af Amer: 45 mL/min/{1.73_m2} — ABNORMAL LOW (ref >59)
GLUCOSE: 83 mg/dL (ref 65–99)
POTASSIUM: 5.2 mmol/L (ref 3.5–5.2)
SODIUM: 135 mmol/L (ref 134–144)

## 2018-03-13 ENCOUNTER — Telehealth: Payer: Self-pay | Admitting: *Deleted

## 2018-03-13 ENCOUNTER — Telehealth: Payer: Self-pay | Admitting: Cardiovascular Disease

## 2018-03-13 ENCOUNTER — Encounter: Payer: Self-pay | Admitting: *Deleted

## 2018-03-13 NOTE — Telephone Encounter (Signed)
New message ° ° °Patient is returning call. °

## 2018-03-13 NOTE — Telephone Encounter (Signed)
Pt aware of lab results with verbal understanding.  Notes recorded by Lennette Bihari, MD on 03/11/2018 at 6:25 PM EST Chronic kidney disease. Creatinine is slightly worse than 3 weeks ago but similar to 2 years ago

## 2018-03-13 NOTE — Telephone Encounter (Signed)
-----   Message from Lennette Bihari, MD sent at 03/11/2018  6:25 PM EST ----- Chronic kidney disease.  Creatinine is slightly worse than 3 weeks ago but similar to 2 years ago

## 2018-03-13 NOTE — Telephone Encounter (Signed)
Called  Home # patient not available. Cell # disconnected. Will mail letter.

## 2018-03-17 ENCOUNTER — Other Ambulatory Visit: Payer: Self-pay | Admitting: Cardiovascular Disease

## 2018-03-18 ENCOUNTER — Ambulatory Visit: Payer: Medicare Other

## 2018-03-18 ENCOUNTER — Encounter

## 2018-03-18 ENCOUNTER — Other Ambulatory Visit: Payer: Self-pay | Admitting: Cardiovascular Disease

## 2018-03-19 ENCOUNTER — Ambulatory Visit (INDEPENDENT_AMBULATORY_CARE_PROVIDER_SITE_OTHER)
Admission: RE | Admit: 2018-03-19 | Discharge: 2018-03-19 | Disposition: A | Discharge status: Home / Self Care | Payer: Medicare Other | Source: Ambulatory Visit | Attending: Cardiovascular Disease | Admitting: Cardiovascular Disease

## 2018-03-19 DIAGNOSIS — I77810 Thoracic aortic ectasia: Secondary | ICD-10-CM | POA: Diagnosis not present

## 2018-03-19 MED ORDER — IOPAMIDOL (ISOVUE-370) INJECTION 76%
100.0000 mL | Freq: Once | INTRAVENOUS | Status: AC | PRN
  Administered 2018-03-19: 100 mL via INTRAVENOUS

## 2018-04-01 ENCOUNTER — Ambulatory Visit (INDEPENDENT_AMBULATORY_CARE_PROVIDER_SITE_OTHER): Payer: Medicare Other | Admitting: Physician Assistant

## 2018-04-01 ENCOUNTER — Encounter: Payer: Self-pay | Admitting: Physician Assistant

## 2018-04-01 VITALS — BP 142/88 | HR 67 | Ht 66.0 in | Wt 192.0 lb

## 2018-04-01 DIAGNOSIS — I2510 Atherosclerotic heart disease of native coronary artery without angina pectoris: Secondary | ICD-10-CM

## 2018-04-01 DIAGNOSIS — I712 Thoracic aortic aneurysm, without rupture, unspecified: Secondary | ICD-10-CM

## 2018-04-01 DIAGNOSIS — I10 Essential (primary) hypertension: Secondary | ICD-10-CM | POA: Diagnosis not present

## 2018-04-01 DIAGNOSIS — N183 Chronic kidney disease, stage 3 unspecified: Secondary | ICD-10-CM

## 2018-04-01 DIAGNOSIS — E785 Hyperlipidemia, unspecified: Secondary | ICD-10-CM | POA: Diagnosis not present

## 2018-04-01 DIAGNOSIS — I420 Dilated cardiomyopathy: Secondary | ICD-10-CM | POA: Diagnosis not present

## 2018-04-01 MED ORDER — ATORVASTATIN CALCIUM 80 MG PO TABS: 80.0000 mg | ORAL_TABLET | Freq: Every day | ORAL | 3 refills | Status: DC

## 2018-04-01 MED ORDER — HYDRALAZINE HCL 50 MG PO TABS: ORAL_TABLET | ORAL | 3 refills | Status: DC

## 2018-04-01 NOTE — Progress Notes (Signed)
Cardiology Office Note    Date:  04/01/2018   ID:  Thomas Bullock, DOB 1950/07/04, MRN [id number]  PCP:  Thomas Register, MD  Cardiologist:  Dr. Tresa Endo  Chief Complaint  Patient presents with  . Follow-up    seen for Dr. Tresa Endo.     History of Present Illness:  Thomas Bullock is a 67 y.o. male with PMH of HTN, chronic diastolic HF, NICM and CAD. Previous renal doppler on 03/19/2003 showed no sign of stenosis. He was admitted to [facility] in May 2013 with acute pulmonary edema. He was found to have nonischemic cardiomyopathy with ejection fraction of 20-25% and minimal CAD with 20% narrowing in the LAD noted on catheterization. He was placed on heart failure medication. Repeat echocardiogram by September 2013 showed improvement of ejection fraction to 45%. EF completely normalized to 60-65% by September 2015 on repeat echo. He was admitted in April 2017 with TIA. MRI of the brain did not show definitive evidence of acute stroke. Echocardiogram obtained during that admission showed EF 55-60%, grade 1 diastolic dysfunction, mild-to-moderate AR. He was last seen by Dr. Tresa Endo on 05/31/2016, his spironolactone was increased to 25 mg daily, Lasix was decreased to 20 mg daily. Based on recent phone note, apparently his Lipitor was held after LFT went up.  Patient was last seen by Dr. Tresa Endo on 02/17/2018, his blood pressure was quite elevated.  His spironolactone was increased to 25 mg twice daily and hydralazine increased to 50 mg 3 times daily.  Amlodipine was added to his medical regimen once he was seen by hypertension clinic on 03/06/2018.  Patient presents today for cardiology office visit.  His blood pressure remain elevated despite the fact that he took his blood pressure medication early this morning.  Manual recheck by myself showed a blood pressure of 138/92.  I increased his hydralazine to 75 mg 3 times daily.  His recent cholesterol lab work also showed severely elevated LDL,  however patient says he has been compliant with the statin therapy.  I will increase Lipitor to 80 mg daily.  He will need a basic metabolic panel to follow up on his renal function as his recent lab work shows elevated creatinine.  He will also need a 74-month fasting lipid panel and LFT.   Past Medical History:  Diagnosis Date  . Acute pulmonary edema (HCC) 10/04/2011  . Coronary artery disease   . GERD (gastroesophageal reflux disease) 10/04/2011  . Hypertension   . LVH (left ventricular hypertrophy) 10/04/2011  . Systolic CHF (HCC) 11/07/2011   Hospitalized for flash pulmonary edema in June 2012 due to malignant HTN.   EF was 20-25% with severe global hypokinesis. left heart cath which revealed an LAD with 20% smooth narrowing but otherwise normal coronaries. On ace inhibitor, spironalactone, carvedilol. Diuresed in hospital.  Life Vest was ordered and fitted by [employer] to wear until September.       Past Surgical History:  Procedure Laterality Date  . CHOLECYSTECTOMY    . CHOLECYSTECTOMY, LAPAROSCOPIC  2006  . DENTAL SURGERY    . LEFT HEART CATHETERIZATION WITH CORONARY ANGIOGRAM N/A 10/05/2011   Procedure: LEFT HEART CATHETERIZATION WITH CORONARY ANGIOGRAM;  Surgeon: Lennette Bihari, MD;  Location: [facility] CATH LAB;  Service: Cardiovascular;  Laterality: N/A;  . RIGHT HEART CATHETERIZATION  10/05/2011   Procedure: RIGHT HEART CATH;  Surgeon: Lennette Bihari, MD;  Location: [facility] CATH LAB;  Service: Cardiovascular;;    Current Medications: Outpatient Medications Prior to  Visit  Medication Sig Dispense Refill  . amLODipine (NORVASC) 5 MG tablet Take 1 tablet (5 mg total) by mouth daily. 30 tablet 5  . aspirin EC 81 MG tablet Take 1 tablet (81 mg total) by mouth daily. 90 tablet 3  . carvedilol (COREG) 25 MG tablet TAKE 1 TABLET BY MOUTH TWICE DAILY WITH  A  MEAL 180 tablet 3  . digoxin (LANOXIN) 0.125 MG tablet TAKE 1/2 (ONE-HALF) TABLET BY MOUTH ONCE DAILY 45 tablet 3  . furosemide (LASIX) 40 MG  tablet Take 0.5 tablets (20 mg total) by mouth daily. (Patient taking differently: Take 20 mg by mouth daily. In afternoons) 30 tablet 5  . furosemide (LASIX) 40 MG tablet TAKE 1 TABLET BY MOUTH ONCE DAILY (Patient taking differently: In mornings.) 90 tablet 1  . isosorbide dinitrate (ISORDIL) 20 MG tablet TAKE 1 TABLET BY MOUTH THREE TIMES DAILY 270 tablet 1  . pantoprazole (PROTONIX) 40 MG tablet TAKE 1 TABLET BY MOUTH ONCE DAILY 90 tablet 1  . spironolactone (ALDACTONE) 25 MG tablet Take 1 tablet (25 mg total) by mouth 2 (two) times daily. 90 tablet 2  . spironolactone (ALDACTONE) 25 MG tablet TAKE 1/2 (ONE-HALF) TABLET BY MOUTH TWICE DAILY 90 tablet 2  . TRAVATAN Z 0.004 % SOLN ophthalmic solution     . atorvastatin (LIPITOR) 40 MG tablet Take 1 tablet (40 mg total) by mouth daily at 6 PM. 90 tablet 3  . hydrALAZINE (APRESOLINE) 50 MG tablet Take 1 tablet (50 mg total) by mouth 3 (three) times daily. 270 tablet 3   No facility-administered medications prior to visit.      Allergies:   Patient has no known allergies.   Social History   Socioeconomic History  . Marital status: Single    Spouse name: Not on file  . Number of children: 0  . Years of education: Not on file  . Highest education level: Not on file  Occupational History  . Not on file  Social Needs  . Financial resource strain: Not on file  . Food insecurity:    Worry: Not on file    Inability: Not on file  . Transportation needs:    Medical: Not on file    Non-medical: Not on file  Tobacco Use  . Smoking status: Former Smoker    Types: Cigarettes    Last attempt to quit: 05/07/1973    Years since quitting: 44.9  . Smokeless tobacco: Never Used  . Tobacco comment: on 11/07/11 patient states never smoker.   Substance and Sexual Activity  . Alcohol use: Yes    Alcohol/week: 2.0 standard drinks    Types: 2 Cans of beer per week    Comment: weekends  . Drug use: No  . Sexual activity: Yes  Lifestyle  . Physical  activity:    Days per week: Not on file    Minutes per session: Not on file  . Stress: Not on file  Relationships  . Social connections:    Talks on phone: Not on file    Gets together: Not on file    Attends religious service: Not on file    Active member of club or organization: Not on file    Attends meetings of clubs or organizations: Not on file    Relationship status: Not on file  Other Topics Concern  . Not on file  Social History Narrative   Patient with finanacial difficulties. Unable to work at [employer] because of him having to  wear lifevest. Will have job opportunity once off in September. Attempting orange card.       High school graduate but expresses has some difficulty reading at times if complicated.    Working at [employer] previously.       Cell phone [phone] not ok to leave message   Home phone [phone] ok to leave message.       Lives with girlfriend. No kids. Friend helps with transport.      Family History:  The patient's family history includes Diabetes type II in his mother; Heart attack in his father; Heart disease in his brother and sister; Kidney disease in his mother; Stroke in his mother.   ROS:   Please see the history of present illness.    ROS All other systems reviewed and are negative.   PHYSICAL EXAM:   VS:  BP (!) 142/88   Pulse 67   Ht 5\' 6"  (1.676 m)   Wt 192 lb (87.1 kg)   SpO2 99%   BMI 30.99 kg/m    GEN: Well nourished, well developed, in no acute distress  HEENT: normal  Neck: no JVD, carotid bruits, or masses Cardiac: RRR; no murmurs, rubs, or gallops,no edema  Respiratory:  clear to auscultation bilaterally, normal work of breathing GI: soft, nontender, nondistended, + BS MS: no deformity or atrophy  Skin: warm and dry, no rash Neuro:  Alert and Oriented x 3, Strength and sensation are intact Psych: euthymic mood, full affect  Wt Readings from Last 3 Encounters:  04/01/18 192 lb (87.1 kg)  02/17/18  190 lb (86.2 kg)  10/09/16 182 lb 3.2 oz (82.6 kg)      Studies/Labs Reviewed:   EKG:  EKG is not ordered today.   Recent Labs: 02/17/2018: ALT 21; Hemoglobin 13.8; Platelets 247; TSH 4.410 03/10/2018: BUN 15; Creatinine, Ser 1.56; Potassium 5.2; Sodium 135   Lipid Panel    Component Value Date/Time   CHOL 207 (H) 02/17/2018 1231   TRIG 174 (H) 02/17/2018 1231   HDL 36 (L) 02/17/2018 1231   CHOLHDL 5.8 (H) 02/17/2018 1231   CHOLHDL 3.9 09/05/2016 1335   VLDL 30 09/05/2016 1335   LDLCALC 136 (H) 02/17/2018 1231    Additional studies/ records that were reviewed today include:   Echo 02/27/2018 LV EF: 50% -   55% Study Conclusions  - Left ventricle: The cavity size was normal. Severe hypertrophy of   the septum (approximately 18 mm) with mild posterior wall   hypertrophy. Systolic function was normal. The estimated ejection   fraction was in the range of 50% to 55%. Wall motion was normal;   there were no regional wall motion abnormalities. Doppler   parameters are consistent with abnormal left ventricular   relaxation (grade 1 diastolic dysfunction). Doppler parameters   are consistent with high ventricular filling pressure (E/e&'   medial = 21). - Aortic valve: Trileaflet; normal thickness leaflets.   Transvalvular velocity was within the normal range. There was no   stenosis. There was trivial regurgitation. - Aortic root: The aortic root was normal in size when indexed to   BSA and age. - Ascending aorta: The ascending aorta was dilated at 47 mm at the   proximal to mid level. See Recommendations. - Mitral valve: Transvalvular velocity was within the normal range.   There was no evidence for stenosis. There was trivial   regurgitation. - Left atrium: The atrium was normal in size. - Right ventricle: The  cavity size was normal. Wall thickness was   normal. Systolic function was normal. RV systolic pressure (S,   est): 13 mm Hg. - Right atrium: The atrium was  normal in size. - Tricuspid valve: There was trivial regurgitation. - Pulmonic valve: There was trivial regurgitation. - Inferior vena cava: The vessel was normal in size. The   respirophasic diameter changes were in the normal range (= 50%),   consistent with normal central venous pressure. - Pericardium, extracardiac: There was no pericardial effusion.  Recommendations:  Consider ECG gated CT angiography of the aorta for complete assessment of the dilation of the thoracic aorta.   CTA of chest aorta 03/19/2018 IMPRESSION: 1. Mild dilatation of the aortic root measuring 4.1-4.2 cm at the level of the sinuses of Valsalva. 2. Aneurysmal disease of the ascending thoracic aorta measuring 4.6 cm in greatest diameter. Ascending thoracic aortic aneurysm. Recommend semi-annual imaging followup by CTA or MRA and referral to cardiothoracic surgery if not already obtained. This recommendation follows 2010 ACCF/AHA/AATS/ACR/ASA/SCA/SCAI/SIR/STS/SVM Guidelines for the Diagnosis and Management of Patients With Thoracic Aortic Disease. Circulation. 2010; 121: A263-F354 3. Coronary atherosclerosis with mild amount of calcified plaque in the proximal LAD and potentially minimal calcified plaque in the proximal left circumflex.   ASSESSMENT:    1. Essential hypertension   2. Congestive dilated cardiomyopathy (HCC)   3. Coronary artery disease involving native coronary artery of native heart without angina pectoris   4. Hyperlipidemia LDL goal <70   5. Chronic renal insufficiency, stage III (moderate) (HCC)   6. Thoracic aortic aneurysm without rupture (HCC)      PLAN:  In order of problems listed above:  1. Hypertension: Increase hydralazine to 75 mg 3 times daily.  Patient had a renal artery Doppler in 2004 that showed patent renal artery without stenosis.  However he is on a lot of blood pressure medications, may need to consider repeat renal artery Doppler  2. Nonischemic  cardiomyopathy: With improved EF.  Last echocardiogram obtained recently October 2019 showed EF 50 to 55%  3. CAD: Mild degree of coronary artery disease on previous cardiac catheterization  4. Hyperlipidemia: Cholesterol uncontrolled, increase Lipitor to 80 mg daily.  A month fasting lipid panel and LFT  5. CKD stage III: Recent lab work showed worsening renal function after increasing spironolactone, will repeat basic metabolic panel  6. Thoracic aortic aneurysm: Seen on recent CT of the chest on 03/19/2018, aneurysmal ascending aorta measuring 4.6 cm.  Will need repeat imaging in 6 months.    Medication Adjustments/Labs and Tests Ordered: Current medicines are reviewed at length with the patient today.  Concerns regarding medicines are outlined above.  Medication changes, Labs and Tests ordered today are listed in the Patient Instructions below. Patient Instructions  Medication Instructions:  Increase Hydralazine to 75 mg ( 1&1/2 tablets ) three times a day Increase Lipitor to 80 mg daily  If you need a refill on your cardiac medications before your next appointment, please call your pharmacy.   Lab work: Bmet today   Fasting Lipid and Hepatic panels in 3 months If you have labs (blood work) drawn today and your tests are completely normal, you will receive your results only by: Marland Kitchen MyChart Message (if you have MyChart) OR . A paper copy in the mail If you have any lab test that is abnormal or we need to change your treatment, we will call you to review the results.  Testing/Procedures: None ordered  Follow-Up: At [facility], you and  your health needs are our priority.  As part of our continuing mission to provide you with exceptional heart care, we have created designated Provider Care Teams.  These Care Teams include your primary Cardiologist (physician) and Advanced Practice Providers (APPs -  Physician Assistants and Nurse Practitioners) who all work together to provide you  with the care you need, when you need it. . Schedule appointment with Dr.Kelly in 3 months . Fasting lipid and hepatic panel 1 week before Dr.Kelly's appointment       Signed, Azalee Course, PA  04/01/2018 1:40 PM    [facility] [address] [place], [place], [place]  [address] Phone: [phone]; Fax: [phone]

## 2018-04-01 NOTE — Patient Instructions (Signed)
Medication Instructions:  Increase Hydralazine to 75 mg ( 1&1/2 tablets ) three times a day Increase Lipitor to 80 mg daily  If you need a refill on your cardiac medications before your next appointment, please call your pharmacy.   Lab work: Bmet today   Fasting Lipid and Hepatic panels in 3 months If you have labs (blood work) drawn today and your tests are completely normal, you will receive your results only by: Marland Kitchen MyChart Message (if you have MyChart) OR . A paper copy in the mail If you have any lab test that is abnormal or we need to change your treatment, we will call you to review the results.  Testing/Procedures: None ordered  Follow-Up: At [facility], you and your health needs are our priority.  As part of our continuing mission to provide you with exceptional heart care, we have created designated Provider Care Teams.  These Care Teams include your primary Cardiologist (physician) and Advanced Practice Providers (APPs -  Physician Assistants and Nurse Practitioners) who all work together to provide you with the care you need, when you need it. . Schedule appointment with Dr.Kelly in 3 months . Fasting lipid and hepatic panel 1 week before Dr.Kelly's appointment

## 2018-04-02 LAB — BASIC METABOLIC PANEL
BUN/Creatinine Ratio: 11 (ref 10–24)
BUN: 14 mg/dL (ref 8–27)
CO2: 21 mmol/L (ref 20–29)
CREATININE: 1.23 mg/dL (ref 0.76–1.27)
Calcium: 9.9 mg/dL (ref 8.6–10.2)
Chloride: 98 mmol/L (ref 96–106)
GFR calc Af Amer: 70 mL/min/{1.73_m2} (ref >59)
GFR calc non Af Amer: 60 mL/min/{1.73_m2} (ref >59)
GLUCOSE: 84 mg/dL (ref 65–99)
Potassium: 4.7 mmol/L (ref 3.5–5.2)
Sodium: 135 mmol/L (ref 134–144)

## 2018-06-09 ENCOUNTER — Other Ambulatory Visit: Payer: Self-pay | Admitting: Cardiovascular Disease

## 2018-06-23 LAB — HEPATIC FUNCTION PANEL
ALT: 14 IU/L (ref 0–44)
AST: 22 IU/L (ref 0–40)
Albumin: 4 g/dL (ref 3.8–4.8)
Alkaline Phosphatase: 94 IU/L (ref 39–117)
BILIRUBIN, DIRECT: 0.11 mg/dL (ref 0.00–0.40)
Bilirubin Total: 0.3 mg/dL (ref 0.0–1.2)
TOTAL PROTEIN: 7.2 g/dL (ref 6.0–8.5)

## 2018-06-23 LAB — LIPID PANEL
CHOL/HDL RATIO: 3.1 ratio (ref 0.0–5.0)
Cholesterol, Total: 109 mg/dL (ref 100–199)
HDL: 35 mg/dL — AB (ref >39)
LDL CALC: 57 mg/dL (ref 0–99)
Triglycerides: 85 mg/dL (ref 0–149)
VLDL Cholesterol Cal: 17 mg/dL (ref 5–40)

## 2018-06-25 NOTE — Progress Notes (Signed)
The patient has been notified of the result and verbalized understanding.  All questions (if any) were answered. Dorris Fetch, CMA 06/25/2018 1:17 PM

## 2018-06-30 ENCOUNTER — Encounter: Payer: Self-pay | Admitting: Cardiovascular Disease

## 2018-06-30 ENCOUNTER — Ambulatory Visit (INDEPENDENT_AMBULATORY_CARE_PROVIDER_SITE_OTHER): Payer: Medicare Other | Admitting: Cardiovascular Disease

## 2018-06-30 VITALS — BP 116/76 | HR 57 | Ht 66.0 in | Wt 191.6 lb

## 2018-06-30 DIAGNOSIS — Z8673 Personal history of transient ischemic attack (TIA), and cerebral infarction without residual deficits: Secondary | ICD-10-CM

## 2018-06-30 DIAGNOSIS — I428 Other cardiomyopathies: Secondary | ICD-10-CM

## 2018-06-30 DIAGNOSIS — I712 Thoracic aortic aneurysm, without rupture, unspecified: Secondary | ICD-10-CM

## 2018-06-30 DIAGNOSIS — E785 Hyperlipidemia, unspecified: Secondary | ICD-10-CM | POA: Diagnosis not present

## 2018-06-30 DIAGNOSIS — I10 Essential (primary) hypertension: Secondary | ICD-10-CM | POA: Diagnosis not present

## 2018-06-30 NOTE — Progress Notes (Signed)
Patient ID: Thomas Bullock, male   DOB: May 28, 1950, 68 y.o.   MRN: [id number]    Primary MD: Dr. Jarold Song  HPI: Thomas Bullock is a 68 y.o. male with a history of a nonischemic cardiomyopathy who presents to the office for a 4 month cardiology evaluation.  Thomas Bullock a long-standing history of hypertension. In May 2013 after being off his medications for some time he presented to [facility] in the setting of acute pulmonary edema. He was found to have a nonischemic cardiomyopathy with an ejection fraction of 20-25% and minimal CAD with 20% narrowing in the LAD noted at catheterization.With aggressive medical therapy and gradual titration of his medications  LV function has improved. An echo Doppler study in September 2013 showed an ejection fraction of 45% and an echo Doppler study on 01/26/2014 revealed complete normalization of LV function with an ejection fraction of 60-65%.  There was grade 1 diastolic dysfunction.  He had a determine LV filling pressure.  PA pressure was normal at 11 mm.  His  ascending aorta was moderately dilated up to 4.7 cm.   He was hospitalized in April 2017 and was felt to have a TIA.  He presented with vertigo and double vision.  An MRI of the brain did not show definitive evidence of an acute stroke.  There was some evidence of low flow.  The right vertebral artery, which was confirmed by carotid Doppler study.  He underwent a 2-D echo Doppler study on 09/06/2015 which showed an EF of 55-60% with grade 1 diastolic dysfunction.  There was mild-to-moderate aortic insufficiency.  There was no evidence for cardiac source of TIA.  He was subtotally seen in neurology follow-up by Dr. Jannifer Franklin in June.  Presently he denies episodes of chest pain.  He has continued to do well the he denies any significant shortness of breath but still notes  mild shortness of breath with activity. He denies   palpitations, presyncope  syncope or arrhythmia.  He admits to occasional episodes of  indigestion and has been taking over-the-counter Tums for this with plus minus benefit.  He denies any edema.  He denies PND, orthopnea.  I last saw him in January 2018 at which time he was doing well without chest pain shortness of breath or palpitations. At that time I further titrated Spironolactone to 25 mg twice a day, decrease furosemide to 20 mg daily and recommended he reduce his aspirin down to 81 mg.  Over the past several years he has been without chest pain or significant swelling.  He has not had a recent echo.  Most recently he has only been taking the Spironolactone 12.5 mg twice a day and he is continued to be on isosorbide/hydralazine 20/37.5 3 times daily he is continued to be on digoxin 0.0625 mg.   When I last saw him in October 2019 his blood pressure was significantly elevated at 170/100.  I further titrated hydralazine to 50 mg every 8 hours.  I increase spironolactone to 25 mg twice a day.  6 arrange follow-up visit in over the past reassessments with Thomas Bullock, PharmD  And Thomas Bullock, [facility], his medications have been further titrated.  He is now on amlodipine 5 mg daily, carvedilol 25 mg twice a day, furosemide 20 mg daily, hydralazine 75 mg 3 times a day isosorbide dinitrate 20 mg 3 times a day in addition to his pantoprazole and atorvastatin 80 mg and aspirin.  He underwent follow-up echo Doppler study which showed  EF at 50 to 55% in October 2019.  His ascending aorta was dilated at 47 mm.  He subsequently underwent CT angiography of his aorta March 19, 2018 which showed mild dilation of the aortic root measuring 4.1 x 4.2 cm at the level of the sinus of Valsalva.  There was aneurysmal disease of the ascending thoracic aorta measuring 4.6 cm in greatest diameter.  He was also noted of coronary atherosclerosis with mild amount of calcified plaque in the proximal LAD and minimal calcified plaque in the proximal circumflex.  He has felt well on his increased medical regimen.  He  presents for evaluation.   Past Medical History:  Diagnosis Date  . Acute pulmonary edema ([place]) 10/04/2011  . Coronary artery disease   . GERD (gastroesophageal reflux disease) 10/04/2011  . Hypertension   . LVH (left ventricular hypertrophy) 10/04/2011  . Systolic CHF ([place]) 09/07/6268   Hospitalized for flash pulmonary edema in June 2012 due to malignant HTN.   EF was 20-25% with severe global hypokinesis. left heart cath which revealed an LAD with 20% smooth narrowing but otherwise normal coronaries. On ace inhibitor, spironalactone, carvedilol. Diuresed in hospital.  Life Vest was ordered and fitted by [facility] to wear until September.       Past Surgical History:  Procedure Laterality Date  . CHOLECYSTECTOMY    . CHOLECYSTECTOMY, LAPAROSCOPIC  2006  . DENTAL SURGERY    . LEFT HEART CATHETERIZATION WITH CORONARY ANGIOGRAM N/A 10/05/2011   Procedure: LEFT HEART CATHETERIZATION WITH CORONARY ANGIOGRAM;  Surgeon: Troy Sine, MD;  Location: [facility] CATH LAB;  Service: Cardiovascular;  Laterality: N/A;  . RIGHT HEART CATHETERIZATION  10/05/2011   Procedure: RIGHT HEART CATH;  Surgeon: Troy Sine, MD;  Location[facility] C[facility] LAB;  Service: Cardiovascular;;    No Known Allergies  Current Outpatient Medications  Medication Sig Dispense Refill  . amLODipine (NORVASC) 5 MG tablet Take 1 tablet (5 mg total) by mouth daily. 30 tablet 5  . aspirin EC 81 MG tablet Take 1 tablet (81 mg total) by mouth daily. 90 tablet 3  . atorvastatin (LIPITOR) 80 MG tablet Take 1 tablet (80 mg total) by mouth daily. 90 tablet 3  . carvedilol (COREG) 25 MG tablet TAKE 1 TABLET BY MOUTH TWICE DAILY WITH  A  MEAL 180 tablet 3  . furosemide (LASIX) 40 MG tablet Take 0.5 tablets (20 mg total) by mouth daily. (Patient taking differently: Take 20 mg by mouth daily. In afternoons) 30 tablet 5  . hydrALAZINE (APRESOLINE) 50 MG tablet Take 1&1/2 tablets three times a day 315 tablet 3  . isosorbide dinitrate (ISORDIL) 20 MG tablet  TAKE 1 TABLET BY MOUTH THREE TIMES DAILY 270 tablet 1  . pantoprazole (PROTONIX) 40 MG tablet TAKE 1 TABLET BY MOUTH ONCE DAILY 90 tablet 1  . spironolactone (ALDACTONE) 25 MG tablet Take 1 tablet (25 mg total) by mouth 2 (two) times daily. 90 tablet 2  . spironolactone (ALDACTONE) 25 MG tablet TAKE 1/2 (ONE-HALF) TABLET BY MOUTH TWICE DAILY 90 tablet 2  . TRAVATAN Z 0.004 % SOLN ophthalmic solution      No current facility-administered medications for this visit.     Socially he is single. He is retired. He works in [employer]. Completed 12th grade education. There is no tobacco history. He does drink occasional alcohol.  ROS General: Negative; No fevers, chills, or night sweats;  HEENT: Negative; No changes in vision or hearing, sinus congestion, difficulty swallowing Pulmonary: Negative; No  cough, wheezing, shortness of breath, hemoptysis Cardiovascular: See history of present illness; No chest pain, presyncope, syncope, palpitations GI: Negative; No nausea, vomiting, diarrhea, or abdominal pain GU: Negative; No dysuria, hematuria, or difficulty voiding Musculoskeletal: Negative; no myalgias, joint pain, or weakness Hematologic/Oncology: Negative; no easy bruising, bleeding Endocrine: Negative; no heat/cold intolerance; no diabetes Neuro: Negative; no changes in balance, headaches Skin: Negative; No rashes or skin lesions Psychiatric: Negative; No behavioral problems, depression Sleep: Negative; No snoring, daytime sleepiness, hypersomnolence, bruxism, restless legs, hypnogognic hallucinations, no cataplexy Other comprehensive 14 point system review is negative.   PE BP 116/76   Pulse (!) 57   Ht '5\' 6"'  (1.676 m)   Wt 191 lb 9.6 oz (86.9 kg)   BMI 30.93 kg/m    Repeat blood pressure by me was 170/100  Wt Readings from Last 3 Encounters:  06/30/18 191 lb 9.6 oz (86.9 kg)  04/01/18 192 lb (87.1 kg)  02/17/18 190 lb (86.2 kg)   General: Alert, oriented, no  distress.  Skin: normal turgor, no rashes, warm and dry HEENT: Normocephalic, atraumatic. Pupils equal round and reactive to light; sclera anicteric; extraocular muscles intact;  Nose without nasal septal hypertrophy Mouth/Parynx benign; Mallinpatti scale 3 Neck: No JVD, no carotid bruits; normal carotid upstroke Lungs: clear to ausculatation and percussion; no wheezing or rales Chest wall: without tenderness to palpitation Heart: PMI not displaced, RRR, s1 s2 normal, 1/6 systolic murmur, no diastolic murmur, no rubs, gallops, thrills, or heaves Abdomen: soft, nontender; no hepatosplenomehaly, BS+; abdominal aorta nontender and not dilated by palpation. Back: no CVA tenderness Pulses 2+ Musculoskeletal: full range of motion, normal strength, no joint deformities Extremities: no clubbing cyanosis or edema, Homan's sign negative  Neurologic: grossly nonfocal; Cranial nerves grossly wnl Psychologic: Normal mood and affect   ECG (independently read by me): Sinus Bradycardia 57, First degree AV Block; PR 226 msec  October 2019 ECG (independently read by me): Sinus bradycardia 55 bpm.  First-degree AV block with a PR interval 218 ms.  QS complex V1 V2.  January 2018 ECG (independently read by me): Normal sinus rhythm at 60 bpm.  Milliseconds.  No ST segment changes.  July 2017 ECG (independently read by me): Normal sinus rhythm with first-degree AV block.  Mild T wave abnormality in lead 3  October ECG (independently read by me): normal sinus rhythm at 65 bpm..  Probable left posterior hemiblock.  Probably progression V1 through V4.  May 2016 ECG (independently read by me): Normal sinus rhythm at 75 bpm.  First-degree AV block with a PR interval 206 ms.  T-wave inversion in leads 3 and mildly in aVF.  September 2015 ECG (independently read by me): Normal sinus rhythm with one isolated PVC.  QTc interval 397 ms.  PR interval 190 ms.  Prior January 2015 ECG (independently read by me): Normal  sinus rhythm at 60 beats per minute. Normal intervals.  Previous ECG from 12/04/2012 : Sinus bradycardia 53 beats per minute period. PR interval 196; QTc interval 377 ms  LABS: BMP Latest Ref Rng & Units 04/01/2018 03/10/2018 02/17/2018  Glucose 65 - 99 mg/dL 84 83 79  BUN 8 - 27 mg/dL '14 15 17  ' Creatinine 0.76 - 1.27 mg/dL 1.23 1.56(H) 1.28(H)  BUN/Creat Ratio 10 - '24 11 10 13  ' Sodium 134 - 144 mmol/L 135 135 138  Potassium 3.5 - 5.2 mmol/L 4.7 5.2 4.6  Chloride 96 - 106 mmol/L 98 96 99  CO2 20 - 29 mmol/L 21 24 25  Calcium 8.6 - 10.2 mg/dL 9.9 10.1 10.0   Hepatic Function Latest Ref Rng & Units 06/23/2018 02/17/2018 10/05/2016  Total Protein 6.0 - 8.5 g/dL 7.2 8.0 7.0  Albumin 3.8 - 4.8 g/dL 4.0 4.4 3.6  AST 0 - 40 IU/L 22 30 59(H)  ALT 0 - 44 IU/L 14 21 49(H)  Alk Phosphatase 39 - 117 IU/L 94 113 88  Total Bilirubin 0.0 - 1.2 mg/dL 0.3 0.5 0.7  Bilirubin, Direct 0.00 - 0.40 mg/dL 0.11 - 0.2   CBC Latest Ref Rng & Units 02/17/2018 06/18/2016 09/04/2015  WBC 3.4 - 10.8 x10E3/uL 7.8 6.2 5.5  Hemoglobin 13.0 - 17.7 g/dL 13.8 14.2 13.4  Hematocrit 37.5 - 51.0 % 42.6 42.1 39.6  Platelets 150 - 450 x10E3/uL 247 223 189   Lab Results  Component Value Date   TSH 4.410 02/17/2018   Lipid Panel     Component Value Date/Time   CHOL 109 06/23/2018 1155   TRIG 85 06/23/2018 1155   HDL 35 (L) 06/23/2018 1155   CHOLHDL 3.1 06/23/2018 1155   CHOLHDL 3.9 09/05/2016 1335   VLDL 30 09/05/2016 1335   LDLCALC 57 06/23/2018 1155   BNP    Component Value Date/Time   PROBNP 1,662.0 (H) 10/06/2011 1586     RADIOLOGY: No results found.  IMPRESSION:  1. Essential hypertension   2. Thoracic aortic aneurysm without rupture ([place])   3. Hyperlipidemia LDL goal <70   4. History of TIA (transient ischemic attack)   5. NICM (nonischemic cardiomyopathy) [facility])     ASSESSMENT AND PLAN: Mr. Antunes is a 68 year old African-American male who has a long-standing history of hypertension and a  history of a nonischemic cardiomyopathy who presented in pulmonary edema after he had stopped his blood pressure medications for several months in May 2013. Since that time on medical therapy his ejection fraction improved from 20% to 60-65% as noted on his echo of 01/26/2014.  He has moderate dilatation of his ascending aorta.  Pulmonary pressures are normal.  When I last saw him in October 2019 he had stage II hypertension since that time he is undergone several dose titrations of his medical management.  His blood pressure today is now excellent on his regimen consisting of amlodipine 5 mg, carvedilol 25 mg twice a day, hydralazine 75 mg 3 times a day, isosorbide dinitrate 20 mg 3 times a day, furosemide 20 mg, and spironolactone 25 mg twice a day.  I reviewed his most recent echo Doppler study.  With his EF at 50 to 55% I have recommended he discontinue digoxin.  Continues to be on atorvastatin 80 mg for hyperlipidemia.  June 23, 2018 lipid studies show total cholesterol 109, HDL 35, LDL 57 with triglycerides 85.  Creatinine was 1.2 09 March 2018.  I reviewed his most recent echo Doppler study as well as his CT scan of his chest which confirms a sending aortic aneurysm of 4.6 cm.  70 annual imaging follow-up is recommended.  If this further increases referral for cardiothoracic surgery will be necessary for continued close monitoring.  I will see him in 6 months for reevaluation or sooner if problems arise.   Time spent: 25 minutes  Troy Sine, MD, [facility]  06/30/2018 6:40 PM

## 2018-06-30 NOTE — Patient Instructions (Signed)
Medication Instructions:  Stop Digoxin If you need a refill on your cardiac medications before your next appointment, please call your pharmacy.   Follow-Up: At [facility], you and your health needs are our priority.  As part of our continuing mission to provide you with exceptional heart care, we have created designated Provider Care Teams.  These Care Teams include your primary Cardiologist (physician) and Advanced Practice Providers (APPs -  Physician Assistants and Nurse Practitioners) who all work together to provide you with the care you need, when you need it. You will need a follow up appointment in 6 months.  Please call our office 2 months in advance to schedule this appointment.  You may see Dr.Kelly or one of the following Advanced Practice Providers on your designated Care Team: Azalee Course, [place] . Micah Flesher, PA-C

## 2018-08-31 ENCOUNTER — Other Ambulatory Visit: Payer: Self-pay | Admitting: Cardiovascular Disease

## 2018-09-01 NOTE — Telephone Encounter (Signed)
Spironolactone refilled.

## 2018-09-21 ENCOUNTER — Other Ambulatory Visit: Payer: Self-pay | Admitting: Cardiovascular Disease

## 2018-10-14 ENCOUNTER — Other Ambulatory Visit: Payer: Self-pay | Admitting: Cardiovascular Disease

## 2018-10-15 ENCOUNTER — Other Ambulatory Visit: Payer: Self-pay

## 2018-10-15 MED ORDER — ISOSORBIDE DINITRATE 20 MG PO TABS: 20.0000 mg | ORAL_TABLET | Freq: Three times a day (TID) | ORAL | 1 refills | Status: DC

## 2018-10-31 ENCOUNTER — Other Ambulatory Visit: Payer: Self-pay | Admitting: Cardiovascular Disease

## 2018-11-16 ENCOUNTER — Other Ambulatory Visit: Payer: Self-pay | Admitting: Cardiovascular Disease

## 2018-12-26 ENCOUNTER — Other Ambulatory Visit: Payer: Self-pay | Admitting: Physician Assistant

## 2018-12-26 ENCOUNTER — Other Ambulatory Visit: Payer: Self-pay | Admitting: Cardiovascular Disease

## 2019-02-03 ENCOUNTER — Other Ambulatory Visit: Payer: Self-pay | Admitting: Cardiovascular Disease

## 2019-02-12 ENCOUNTER — Other Ambulatory Visit: Payer: Self-pay

## 2019-02-12 ENCOUNTER — Ambulatory Visit (INDEPENDENT_AMBULATORY_CARE_PROVIDER_SITE_OTHER): Payer: Medicare Other | Admitting: Cardiovascular Disease

## 2019-02-12 ENCOUNTER — Encounter: Payer: Self-pay | Admitting: Cardiovascular Disease

## 2019-02-12 DIAGNOSIS — I10 Essential (primary) hypertension: Secondary | ICD-10-CM | POA: Diagnosis not present

## 2019-02-12 DIAGNOSIS — Z79899 Other long term (current) drug therapy: Secondary | ICD-10-CM

## 2019-02-12 DIAGNOSIS — I517 Cardiomegaly: Secondary | ICD-10-CM

## 2019-02-12 DIAGNOSIS — E785 Hyperlipidemia, unspecified: Secondary | ICD-10-CM | POA: Diagnosis not present

## 2019-02-12 DIAGNOSIS — I428 Other cardiomyopathies: Secondary | ICD-10-CM

## 2019-02-12 DIAGNOSIS — I77810 Thoracic aortic ectasia: Secondary | ICD-10-CM

## 2019-02-12 MED ORDER — ATORVASTATIN CALCIUM 80 MG PO TABS: 80.0000 mg | ORAL_TABLET | Freq: Every day | ORAL | 3 refills | Status: DC

## 2019-02-12 MED ORDER — FUROSEMIDE 40 MG PO TABS: 20.0000 mg | ORAL_TABLET | Freq: Every day | ORAL | 0 refills | Status: DC

## 2019-02-12 MED ORDER — PANTOPRAZOLE SODIUM 40 MG PO TBEC: 40.0000 mg | DELAYED_RELEASE_TABLET | Freq: Every day | ORAL | 3 refills | Status: DC

## 2019-02-12 MED ORDER — CARVEDILOL 25 MG PO TABS: ORAL_TABLET | ORAL | 3 refills | Status: DC

## 2019-02-12 MED ORDER — HYDRALAZINE HCL 50 MG PO TABS: ORAL_TABLET | ORAL | 3 refills | Status: DC

## 2019-02-12 NOTE — Patient Instructions (Signed)
Medication Instructions:  The current medical regimen is effective;  continue present plan and medications as directed. Please refer to the Current Medication list given to you today. If you need a refill on your cardiac medications before your next appointment, please call your pharmacy.  Labwork: CMET TODAY HERE IN OUR OFFICE AT [facility]   You will need to fast. DO NOT EAT OR DRINK PAST MIDNIGHT.     You will NOT need to fast   Take the provided lab slips with you to the lab for your blood draw.   When you have your labs (blood work) drawn today and your tests are completely normal, you will receive your results only by MyChart Message (if you have MyChart) -OR-  A paper copy in the mail.  If you have any lab test that is abnormal or we need to change your treatment, we will call you to review these results.  Follow-Up: You will need a follow up appointment in 12 months.  Please call our office 2 months in advance, AUGUST 2021 to schedule this, October 2021 appointment.  You may see Shelva Majestic, MD or one of the following Advanced Practice Providers on your designated Care Team:  Almyra Deforest, PA-C   Fabian Sharp, PA-C     At [facility], you and your health needs are our priority.  As part of our continuing mission to provide you with exceptional heart care, we have created designated Provider Care Teams.  These Care Teams include your primary Cardiologist (physician) and Advanced Practice Providers (APPs -  Physician Assistants and Nurse Practitioners) who all work together to provide you with the care you need, when you need it.  Thank you for choosing [facility] at [facility]!!

## 2019-02-12 NOTE — Progress Notes (Signed)
Patient ID: Thomas Bullock, male   DOB: 1950-09-08, 68 y.o.   MRN: [id number]    Primary MD: Dr. Jarold Song  HPI: Thomas Bullock is a 68 y.o. male with a history of a nonischemic cardiomyopathy who presents to the office for an 8 month cardiology evaluation.  Thomas Bullock a long-standing history of hypertension. In May 2013 after being off his medications for some time he presented to [facility] in the setting of acute pulmonary edema. He was found to have a nonischemic cardiomyopathy with an ejection fraction of 20-25% and minimal CAD with 20% narrowing in the LAD noted at catheterization.With aggressive medical therapy and gradual titration of his medications  LV function has improved. An echo Doppler study in September 2013 showed an ejection fraction of 45% and an echo Doppler study on 01/26/2014 revealed complete normalization of LV function with an ejection fraction of 60-65%.  There was grade 1 diastolic dysfunction.  He had a determine LV filling pressure.  PA pressure was normal at 11 mm.  His  ascending aorta was moderately dilated up to 4.7 cm.   He was hospitalized in April 2017 and was felt to have a TIA.  He presented with vertigo and double vision.  An MRI of the brain did not show definitive evidence of an acute stroke.  There was some evidence of low flow.  The right vertebral artery, which was confirmed by carotid Doppler study.  He underwent a 2-D echo Doppler study on 09/06/2015 which showed an EF of 55-60% with grade 1 diastolic dysfunction.  There was mild-to-moderate aortic insufficiency.  There was no evidence for cardiac source of TIA.  He was subtotally seen in neurology follow-up by Dr. Jannifer Franklin in June.  Presently he denies episodes of chest pain.  He has continued to do well the he denies any significant shortness of breath but still notes  mild shortness of breath with activity. He denies   palpitations, presyncope  syncope or arrhythmia.  He admits to occasional episodes of  indigestion and has been taking over-the-counter Tums for this with plus minus benefit.  He denies any edema.  He denies PND, orthopnea.  I  saw him in January 2018 at which time he was doing well without chest pain shortness of breath or palpitations. At that time I further titrated Spironolactone to 25 mg twice a day, decrease furosemide to 20 mg daily and recommended he reduce his aspirin down to 81 mg.  Over the past several years he has been without chest pain or significant swelling.  He has not had a recent echo.  Most recently he has only been taking the Spironolactone 12.5 mg twice a day and he is continued to be on isosorbide/hydralazine 20/37.5 3 times daily he is continued to be on digoxin 0.0625 mg.   When I  saw him in October 2019 his blood pressure was significantly elevated at 170/100.  I further titrated hydralazine to 50 mg every 8 hours.  I increase spironolactone to 25 mg twice a day.  6 arrange follow-up visit in over the past reassessments with Joslyn Hy, PharmD  And Almyra Deforest, [facility], his medications have been further titrated.  He is now on amlodipine 5 mg daily, carvedilol 25 mg twice a day, furosemide 20 mg daily, hydralazine 75 mg 3 times a day isosorbide dinitrate 20 mg 3 times a day in addition to his pantoprazole and atorvastatin 80 mg and aspirin.  He underwent follow-up echo Doppler study which showed  EF at 50 to 55% in October 2019.  His ascending aorta was dilated at 47 mm.  He subsequently underwent CT angiography of his aorta March 19, 2018 which showed mild dilation of the aortic root measuring 4.1 x 4.2 cm at the level of the sinus of Valsalva.  There was aneurysmal disease of the ascending thoracic aorta measuring 4.6 cm in greatest diameter.  He was also noted of coronary atherosclerosis with mild amount of calcified plaque in the proximal LAD and minimal calcified plaque in the proximal circumflex.  He felt well on his increased medical regimen.    I last saw him  in February 2020 and prior to that evaluation he had undergone several dose titrations of his medical management.  His blood pressure was significantly improved on a regimen consisting of amlodipine 5 mg, carvedilol 25 mg twice a day, hydralazine 75 mg 3 times a day, isosorbide dinitrate 20 mg 3 times a day, furosemide 20 mg daily, and spironolactone 25 mg twice a day.  Since his echo Doppler study showed an EF of 50 to 55%, I recommended discontinuance of digoxin.  He has continued to be on atorvastatin 80 mg for hyperlipidemia.  Over the past several months, he has continued to feel well.  He presents for follow-up evaluation.   Past Medical History:  Diagnosis Date  . Acute pulmonary edema ([place]) 10/04/2011  . Coronary artery disease   . GERD (gastroesophageal reflux disease) 10/04/2011  . Hypertension   . LVH (left ventricular hypertrophy) 10/04/2011  . Systolic CHF ([place]) 0/12/6576   Hospitalized for flash pulmonary edema in June 2012 due to malignant HTN.   EF was 20-25% with severe global hypokinesis. left heart cath which revealed an LAD with 20% smooth narrowing but otherwise normal coronaries. On ace inhibitor, spironalactone, carvedilol. Diuresed in hospital.  Life Vest was ordered and fitted by [employer] to wear until September.       Past Surgical History:  Procedure Laterality Date  . CHOLECYSTECTOMY    . CHOLECYSTECTOMY, LAPAROSCOPIC  2006  . DENTAL SURGERY    . LEFT HEART CATHETERIZATION WITH CORONARY ANGIOGRAM N/A 10/05/2011   Procedure: LEFT HEART CATHETERIZATION WITH CORONARY ANGIOGRAM;  Surgeon: Troy Sine, MD;  Location: [facility] C[facility] LAB;  Service: Cardiovascular;  Laterality: N/A;  . RIGHT HEART CATHETERIZATION  10/05/2011   Procedure: RIGHT HEART CATH;  Surgeon: Troy Sine, MD;  Location: [facility] CATH LAB;  Service: Cardiovascular;;    No Known Allergies  Current Outpatient Medications  Medication Sig Dispense Refill  . amLODipine (NORVASC) 5 MG tablet Take 1 tablet by mouth once  daily 30 tablet 0  . aspirin EC 81 MG tablet Take 1 tablet (81 mg total) by mouth daily. 90 tablet 3  . carvedilol (COREG) 25 MG tablet TAKE 1 TABLET BY MOUTH TWICE DAILY WITH  A  MEAL 180 tablet 3  . furosemide (LASIX) 40 MG tablet Take 0.5 tablets (20 mg total) by mouth daily. In afternoons 45 tablet 0  . hydrALAZINE (APRESOLINE) 50 MG tablet Take 1&1/2 tablets three times a day 315 tablet 3  . isosorbide dinitrate (ISORDIL) 20 MG tablet TAKE 1 TABLET BY MOUTH THREE TIMES DAILY 270 tablet 0  . isosorbide dinitrate (ISORDIL) 20 MG tablet Take 1 tablet (20 mg total) by mouth 3 (three) times daily. 270 tablet 1  . pantoprazole (PROTONIX) 40 MG tablet Take 1 tablet (40 mg total) by mouth daily. 90 tablet 3  . spironolactone (ALDACTONE) 25 MG tablet Take  1 tablet by mouth twice daily 90 tablet 0  . TRAVATAN Z 0.004 % SOLN ophthalmic solution     . atorvastatin (LIPITOR) 80 MG tablet Take 1 tablet (80 mg total) by mouth daily. 90 tablet 3   No current facility-administered medications for this visit.     Socially he is single. He is retired. He works in [employer]. Completed 12th grade education. There is no tobacco history. He does drink occasional alcohol.  ROS General: Negative; No fevers, chills, or night sweats;  HEENT: Negative; No changes in vision or hearing, sinus congestion, difficulty swallowing Pulmonary: Negative; No cough, wheezing, shortness of breath, hemoptysis Cardiovascular: See history of present illness; No chest pain, presyncope, syncope, palpitations GI: Negative; No nausea, vomiting, diarrhea, or abdominal pain GU: Negative; No dysuria, hematuria, or difficulty voiding Musculoskeletal: Negative; no myalgias, joint pain, or weakness Hematologic/Oncology: Negative; no easy bruising, bleeding Endocrine: Negative; no heat/cold intolerance; no diabetes Neuro: Negative; no changes in balance, headaches Skin: Negative; No rashes or skin lesions Psychiatric:  Negative; No behavioral problems, depression Sleep: Negative; No snoring, daytime sleepiness, hypersomnolence, bruxism, restless legs, hypnogognic hallucinations, no cataplexy Other comprehensive 14 point system review is negative.   PE BP (!) 137/91   Pulse (!) 56   Ht '5\' 6"'  (1.676 m)   Wt 191 lb (86.6 kg)   SpO2 97%   BMI 30.83 kg/m    Repeat blood pressure by me 130/84  Wt Readings from Last 3 Encounters:  02/12/19 191 lb (86.6 kg)  06/30/18 191 lb 9.6 oz (86.9 kg)  04/01/18 192 lb (87.1 kg)   General: Alert, oriented, no distress.  Skin: normal turgor, no rashes, warm and dry HEENT: Normocephalic, atraumatic. Pupils equal round and reactive to light; sclera anicteric; extraocular muscles intact;  Nose without nasal septal hypertrophy Mouth/Parynx benign; Mallinpatti scale 3 Neck: No JVD, no carotid bruits; normal carotid upstroke Lungs: clear to ausculatation and percussion; no wheezing or rales Chest wall: without tenderness to palpitation Heart: PMI not displaced, RRR, s1 s2 normal, 1/6 systolic murmur, no diastolic murmur, no rubs, gallops, thrills, or heaves Abdomen: soft, nontender; no hepatosplenomehaly, BS+; abdominal aorta nontender and not dilated by palpation. Back: no CVA tenderness Pulses 2+ Musculoskeletal: full range of motion, normal strength, no joint deformities Extremities: no clubbing cyanosis or edema, Homan's sign negative  Neurologic: grossly nonfocal; Cranial nerves grossly wnl Psychologic: Normal mood and affect    ECG (independently read by me): Sinus Bradycardia at 56, First degree AV block; PR 224 msec  February 2020 ECG (independently read by me): Sinus Bradycardia 57, First degree AV Block; PR 226 msec  October 2019 ECG (independently read by me): Sinus bradycardia 55 bpm.  First-degree AV block with a PR interval 218 ms.  QS complex V1 V2.  January 2018 ECG (independently read by me): Normal sinus rhythm at 60 bpm.  Milliseconds.  No ST  segment changes.  July 2017 ECG (independently read by me): Normal sinus rhythm with first-degree AV block.  Mild T wave abnormality in lead 3  October ECG (independently read by me): normal sinus rhythm at 65 bpm..  Probable left posterior hemiblock.  Probably progression V1 through V4.  May 2016 ECG (independently read by me): Normal sinus rhythm at 75 bpm.  First-degree AV block with a PR interval 206 ms.  T-wave inversion in leads 3 and mildly in aVF.  September 2015 ECG (independently read by me): Normal sinus rhythm with one isolated PVC.  QTc interval 397 ms.  PR interval 190 ms.  Prior January 2015 ECG (independently read by me): Normal sinus rhythm at 60 beats per minute. Normal intervals.  Previous ECG from 12/04/2012 : Sinus bradycardia 53 beats per minute period. PR interval 196; QTc interval 377 ms  LABS: BMP Latest Ref Rng & Units 02/12/2019 04/01/2018 03/10/2018  Glucose 65 - 99 mg/dL 75 84 83  BUN 8 - 27 mg/dL '13 14 15  ' Creatinine 0.76 - 1.27 mg/dL 1.36(H) 1.23 1.56(H)  BUN/Creat Ratio 10 - '24 10 11 10  ' Sodium 134 - 144 mmol/L 137 135 135  Potassium 3.5 - 5.2 mmol/L 4.6 4.7 5.2  Chloride 96 - 106 mmol/L 100 98 96  CO2 20 - 29 mmol/L '24 21 24  ' Calcium 8.6 - 10.2 mg/dL 10.6(H) 9.9 10.1   Hepatic Function Latest Ref Rng & Units 02/12/2019 06/23/2018 02/17/2018  Total Protein 6.0 - 8.5 g/dL 8.2 7.2 8.0  Albumin 3.8 - 4.8 g/dL 4.4 4.0 4.4  AST 0 - 40 IU/L '29 22 30  ' ALT 0 - 44 IU/L '17 14 21  ' Alk Phosphatase 39 - 117 IU/L 116 94 113  Total Bilirubin 0.0 - 1.2 mg/dL 0.6 0.3 0.5  Bilirubin, Direct 0.00 - 0.40 mg/dL - 0.11 -   CBC Latest Ref Rng & Units 02/17/2018 06/18/2016 09/04/2015  WBC 3.4 - 10.8 x10E3/uL 7.8 6.2 5.5  Hemoglobin 13.0 - 17.7 g/dL 13.8 14.2 13.4  Hematocrit 37.5 - 51.0 % 42.6 42.1 39.6  Platelets 150 - 450 x10E3/uL 247 223 189   Lab Results  Component Value Date   TSH 4.410 02/17/2018   Lipid Panel     Component Value Date/Time   CHOL 109 06/23/2018  1155   TRIG 85 06/23/2018 1155   HDL 35 (L) 06/23/2018 1155   CHOLHDL 3.1 06/23/2018 1155   CHOLHDL 3.9 09/05/2016 1335   VLDL 30 09/05/2016 1335   LDLCALC 57 06/23/2018 1155   BNP    Component Value Date/Time   PROBNP 1,662.0 (H) 10/06/2011 8592     RADIOLOGY: No results found.  IMPRESSION:  1. Essential hypertension   2. LVH (left ventricular hypertrophy)   3. Nonischemic cardiomyopathy ([place])   4. Hyperlipidemia LDL goal <70   5. Ascending aorta dilation ([facility])   6. Medication management     ASSESSMENT AND PLAN: Thomas Bullock is a 68 year-old African-American male who has a long-standing history of hypertension and a history of a nonischemic cardiomyopathy who presented in pulmonary edema after he had stopped his blood pressure medications for several months in May 2013. Since that time on medical therapy his ejection fraction improved from 20% to 60-65% as noted on his echo of 01/26/2014.  He has moderate dilatation of his ascending aorta.  Pulmonary pressures were normal.  When I  saw him in October 2019 he had stage II hypertension since that time he is undergone several dose titrations of his medical management.  His blood pressure today is now significantly improved and 1 repeat by me was 130/84 on his regimen consisting of amlodipine 5 mg, carvedilol 25 mg twice a day, furosemide 20 mg daily, hydralazine 75 mg 3 times daily with isosorbide dinitrate 20 mg 3 times daily (BiDil dosing) in addition to spironolactone 25 mg twice a day.  Laboratory show stable renal function with a creatinine of 1.23.  He continues to be on atorvastatin 80 mg daily and lipid studies earlier this year showed LDL cholesterol at 57.  His ECG today shows sinus bradycardia with  first-degree block.  He has a documented ascending aortic aneurysm which measured 4.6 cm on CT imaging.  I would recommend follow-up CT imaging next year with follow-up echo Doppler assessment.  He continues to be followed by Dr.  Margarita Rana.  I will see him in 6 to 12 months for follow-up evaluation or sooner problems arise.  Time spent: 25 minutes  Troy Sine, MD, [facility]  02/14/2019 11:38 AM

## 2019-02-13 LAB — COMPREHENSIVE METABOLIC PANEL
ALT: 17 IU/L (ref 0–44)
AST: 29 IU/L (ref 0–40)
Albumin/Globulin Ratio: 1.2 (ref 1.2–2.2)
Albumin: 4.4 g/dL (ref 3.8–4.8)
Alkaline Phosphatase: 116 IU/L (ref 39–117)
BUN/Creatinine Ratio: 10 (ref 10–24)
BUN: 13 mg/dL (ref 8–27)
Bilirubin Total: 0.6 mg/dL (ref 0.0–1.2)
CO2: 24 mmol/L (ref 20–29)
Calcium: 10.6 mg/dL — ABNORMAL HIGH (ref 8.6–10.2)
Chloride: 100 mmol/L (ref 96–106)
Creatinine, Ser: 1.36 mg/dL — ABNORMAL HIGH (ref 0.76–1.27)
GFR calc Af Amer: 61 mL/min/{1.73_m2} (ref >59)
GFR calc non Af Amer: 53 mL/min/{1.73_m2} — ABNORMAL LOW (ref >59)
Globulin, Total: 3.8 g/dL (ref 1.5–4.5)
Glucose: 75 mg/dL (ref 65–99)
Potassium: 4.6 mmol/L (ref 3.5–5.2)
Sodium: 137 mmol/L (ref 134–144)
Total Protein: 8.2 g/dL (ref 6.0–8.5)

## 2019-02-14 ENCOUNTER — Encounter: Payer: Self-pay | Admitting: Cardiovascular Disease

## 2019-02-24 ENCOUNTER — Other Ambulatory Visit: Payer: Self-pay | Admitting: Cardiovascular Disease

## 2019-03-07 ENCOUNTER — Other Ambulatory Visit: Payer: Self-pay | Admitting: Cardiovascular Disease

## 2019-03-18 ENCOUNTER — Other Ambulatory Visit: Payer: Self-pay | Admitting: Cardiovascular Disease

## 2019-03-20 ENCOUNTER — Other Ambulatory Visit: Payer: Self-pay

## 2019-05-04 ENCOUNTER — Other Ambulatory Visit: Payer: Self-pay | Admitting: Physician Assistant

## 2019-07-16 ENCOUNTER — Other Ambulatory Visit: Payer: Self-pay | Admitting: Cardiovascular Disease

## 2019-11-30 ENCOUNTER — Other Ambulatory Visit: Payer: Self-pay | Admitting: Cardiovascular Disease

## 2019-11-30 ENCOUNTER — Other Ambulatory Visit: Payer: Self-pay | Admitting: Physician Assistant

## 2020-02-24 ENCOUNTER — Other Ambulatory Visit: Payer: Self-pay | Admitting: Cardiovascular Disease

## 2020-03-14 ENCOUNTER — Other Ambulatory Visit: Payer: Self-pay | Admitting: Cardiovascular Disease

## 2020-03-29 ENCOUNTER — Other Ambulatory Visit: Payer: Self-pay | Admitting: Gastroenterology

## 2020-03-29 DIAGNOSIS — K7400 Hepatic fibrosis, unspecified: Secondary | ICD-10-CM

## 2020-04-04 ENCOUNTER — Other Ambulatory Visit: Payer: Self-pay | Admitting: Cardiovascular Disease

## 2020-04-07 ENCOUNTER — Other Ambulatory Visit: Payer: Medicare Other

## 2020-05-10 ENCOUNTER — Other Ambulatory Visit: Payer: Self-pay

## 2020-05-10 ENCOUNTER — Encounter: Payer: Self-pay | Admitting: Cardiovascular Disease

## 2020-05-10 ENCOUNTER — Other Ambulatory Visit: Payer: Self-pay | Admitting: Cardiovascular Disease

## 2020-05-10 ENCOUNTER — Ambulatory Visit (INDEPENDENT_AMBULATORY_CARE_PROVIDER_SITE_OTHER): Payer: Medicare Other | Admitting: Cardiovascular Disease

## 2020-05-10 DIAGNOSIS — I10 Essential (primary) hypertension: Secondary | ICD-10-CM | POA: Diagnosis not present

## 2020-05-10 DIAGNOSIS — I517 Cardiomegaly: Secondary | ICD-10-CM | POA: Diagnosis not present

## 2020-05-10 DIAGNOSIS — E785 Hyperlipidemia, unspecified: Secondary | ICD-10-CM

## 2020-05-10 DIAGNOSIS — I77810 Thoracic aortic ectasia: Secondary | ICD-10-CM

## 2020-05-10 DIAGNOSIS — Z79899 Other long term (current) drug therapy: Secondary | ICD-10-CM

## 2020-05-10 DIAGNOSIS — I428 Other cardiomyopathies: Secondary | ICD-10-CM | POA: Diagnosis not present

## 2020-05-10 NOTE — Patient Instructions (Signed)
Medication Instructions:  No changes *If you need a refill on your cardiac medications before your next appointment, please call your pharmacy*   Lab Work: CBC, CMET, TSH, Lipid profile - FASTING If you have labs (blood work) drawn today and your tests are completely normal, you will receive your results only by: Marland Kitchen MyChart Message (if you have MyChart) OR . A paper copy in the mail If you have any lab test that is abnormal or we need to change your treatment, we will call you to review the results.   Testing/Procedures: None ordered   Follow-Up: At [facility], you and your health needs are our priority.  As part of our continuing mission to provide you with exceptional heart care, we have created designated Provider Care Teams.  These Care Teams include your primary Cardiologist (physician) and Advanced Practice Providers (APPs -  Physician Assistants and Nurse Practitioners) who all work together to provide you with the care you need, when you need it.  We recommend signing up for the patient portal called "MyChart".  Sign up information is provided on this After Visit Summary.  MyChart is used to connect with patients for Virtual Visits (Telemedicine).  Patients are able to view lab/test results, encounter notes, upcoming appointments, etc.  Non-urgent messages can be sent to your provider as well.   To learn more about what you can do with MyChart, go to [email or web address].    Your next appointment:   12 month(s)  The format for your next appointment:   In Person  Provider:   Nicki Guadalajara, MD  Your physician wants you to follow-up in: 12 months. You will receive a reminder letter in the mail two months in advance. If you don't receive a letter, please call our office to schedule the follow-up appointment.  Other Instructions None

## 2020-05-10 NOTE — Progress Notes (Signed)
Patient ID: Thomas Bullock, male   DOB: October 13, 1950, 70 y.o.   MRN: [id number]    Primary MD: Dr. Jarold Song  HPI: Thomas Bullock is a 70 y.o. male with a history of a nonischemic cardiomyopathy who presents to the office for a follow-up cardiology evaluation.  I last saw him in October 2020  Thomas Bullock a long-standing history of hypertension. In May 2013 after being off his medications for some time he presented to [facility] in the setting of acute pulmonary edema. He was found to have a nonischemic cardiomyopathy with an ejection fraction of 20-25% and minimal CAD with 20% narrowing in the LAD noted at catheterization.With aggressive medical therapy and gradual titration of his medications  LV function has improved. An echo Doppler study in September 2013 showed an ejection fraction of 45% and an echo Doppler study on 01/26/2014 revealed complete normalization of LV function with an ejection fraction of 60-65%.  There was grade 1 diastolic dysfunction.  He had a determine LV filling pressure.  PA pressure was normal at 11 mm.  His  ascending aorta was moderately dilated up to 4.7 cm.   He was hospitalized in April 2017 and was felt to have a TIA.  He presented with vertigo and double vision.  An MRI of the brain did not show definitive evidence of an acute stroke.  There was some evidence of low flow.  The right vertebral artery, which was confirmed by carotid Doppler study.  He underwent a 2-D echo Doppler study on 09/06/2015 which showed an EF of 55-60% with grade 1 diastolic dysfunction.  There was mild-to-moderate aortic insufficiency.  There was no evidence for cardiac source of TIA.  He was subtotally seen in neurology follow-up by Dr. Jannifer Franklin in June.  Presently he denies episodes of chest pain.  He has continued to do well the he denies any significant shortness of breath but still notes  mild shortness of breath with activity. He denies   palpitations, presyncope  syncope or arrhythmia.  He  admits to occasional episodes of indigestion and has been taking over-the-counter Tums for this with plus minus benefit.  He denies any edema.  He denies PND, orthopnea.  I saw him in January 2018 at which time he was doing well without chest pain shortness of breath or palpitations. At that time I further titrated Spironolactone to 25 mg twice a day, decrease furosemide to 20 mg daily and recommended he reduce his aspirin down to 81 mg.  Over the past several years he has been without chest pain or significant swelling.  He has not had a recent echo.  Most recently he has only been taking the Spironolactone 12.5 mg twice a day and he is continued to be on isosorbide/hydralazine 20/37.5 3 times daily he is continued to be on digoxin 0.0625 mg.   When I saw him in October 2019 his blood pressure was significantly elevated at 170/100.  I further titrated hydralazine to 50 mg every 8 hours.  I increase spironolactone to 25 mg twice a day.  6 arrange follow-up visit in over the past reassessments with Joslyn Hy, PharmD  And Almyra Deforest, [facility], his medications have been further titrated.  He is now on amlodipine 5 mg daily, carvedilol 25 mg twice a day, furosemide 20 mg daily, hydralazine 75 mg 3 times a day isosorbide dinitrate 20 mg 3 times a day in addition to his pantoprazole and atorvastatin 80 mg and aspirin.  He underwent follow-up  echo Doppler study which showed EF at 50 to 55% in October 2019.  His ascending aorta was dilated at 47 mm.  He subsequently underwent CT angiography of his aorta March 19, 2018 which showed mild dilation of the aortic root measuring 4.1 x 4.2 cm at the level of the sinus of Valsalva.  There was aneurysmal disease of the ascending thoracic aorta measuring 4.6 cm in greatest diameter.  He was also noted of coronary atherosclerosis with mild amount of calcified plaque in the proximal LAD and minimal calcified plaque in the proximal circumflex.  He felt well on his increased  medical regimen.    I saw him in February 2020 and prior to that evaluation he had undergone several dose titrations of his medical management.  His blood pressure was significantly improved on a regimen consisting of amlodipine 5 mg, carvedilol 25 mg twice a day, hydralazine 75 mg 3 times a day, isosorbide dinitrate 20 mg 3 times a day, furosemide 20 mg daily, and spironolactone 25 mg twice a day.  Since his echo Doppler study showed an EF of 50 to 55%, I recommended discontinuance of digoxin.  He has continued to be on atorvastatin 80 mg for hyperlipidemia.    I last saw him in October 2020.  At that time he continued to feel well.  His blood pressure was well controlled and he continued to be on amlodipine 5 mg, carvedilol 25 mg twice a day, furosemide 20 mg daily, hydralazine 75 mg 3 times a day with isosorbide dinitrate 20 mg 3 times per day (by double dosing) in addition to spironolactone 25 mg daily.  He continued to be on atorvastatin 80 mg with LDL cholesterol at 57.    Since I last saw him, he has remained stable.  He specifically denies any chest pain or shortness of breath.  He denies any leg swelling.  He occasionally walks and denies symptoms.  He tells me he will be undergoing a colonoscopy with [facility] in several weeks.  He denies PND orthopnea or palpitations.  He has not had recent laboratory.  He presents for evaluation.  Past Medical History:  Diagnosis Date  . Acute pulmonary edema ([place]) 10/04/2011  . Coronary artery disease   . GERD (gastroesophageal reflux disease) 10/04/2011  . Hypertension   . LVH (left ventricular hypertrophy) 10/04/2011  . Systolic CHF ([place]) 06/13/621   Hospitalized for flash pulmonary edema in June 2012 due to malignant HTN.   EF was 20-25% with severe global hypokinesis. left heart cath which revealed an LAD with 20% smooth narrowing but otherwise normal coronaries. On ace inhibitor, spironalactone, carvedilol. Diuresed in hospital.  Life Vest was ordered  and fitted by [employer] to wear until September.       Past Surgical History:  Procedure Laterality Date  . CHOLECYSTECTOMY    . CHOLECYSTECTOMY, LAPAROSCOPIC  2006  . DENTAL SURGERY    . LEFT HEART CATHETERIZATION WITH CORONARY ANGIOGRAM N/A 10/05/2011   Procedure: LEFT HEART CATHETERIZATION WITH CORONARY ANGIOGRAM;  Surgeon: Troy Sine, MD;  Location: [facility] CATH LAB;  Service: Cardiovascular;  Laterality: N/A;  . RIGHT HEART CATHETERIZATION  10/05/2011   Procedure: RIGHT HEART CATH;  Surgeon: Troy Sine, MD;  Location: [facility] CATH LAB;  Service: Cardiovascular;;    No Known Allergies  Current Outpatient Medications  Medication Sig Dispense Refill  . aspirin EC 81 MG tablet Take 1 tablet (81 mg total) by mouth daily. 90 tablet 3  . atorvastatin (LIPITOR) 80  MG tablet Take 1 tablet by mouth once daily 90 tablet 0  . carvedilol (COREG) 25 MG tablet TAKE 1 TABLET BY MOUTH TWICE DAILY WITH  A  MEAL 180 tablet 3  . furosemide (LASIX) 40 MG tablet TAKE 1/2 (ONE-HALF) TABLET BY MOUTH ONCE DAILY IN  THE  AFTERNOONS 45 tablet 1  . hydrALAZINE (APRESOLINE) 50 MG tablet TAKE 1 & 1/2 (ONE & ONE-HALF) TABLETS BY MOUTH THREE TIMES DAILY 315 tablet 1  . isosorbide dinitrate (ISORDIL) 20 MG tablet TAKE 1 TABLET BY MOUTH THREE TIMES DAILY 270 tablet 0  . isosorbide dinitrate (ISORDIL) 20 MG tablet TAKE 1 TABLET BY MOUTH THREE TIMES DAILY 270 tablet 1  . pantoprazole (PROTONIX) 40 MG tablet Take 1 tablet (40 mg total) by mouth daily. Pt needs to keep appt in Jan, 2022 for further refills 90 tablet 0  . spironolactone (ALDACTONE) 25 MG tablet Take 1 tablet by mouth twice daily 180 tablet 1  . TRAVATAN Z 0.004 % SOLN ophthalmic solution     . amLODipine (NORVASC) 5 MG tablet Take 1 tablet by mouth once daily 90 tablet 0   No current facility-administered medications for this visit.    Socially he is single. He is retired. He works in [employer]. Completed 12th grade education. There is no tobacco  history. He does drink occasional alcohol.  ROS General: Negative; No fevers, chills, or night sweats;  HEENT: Negative; No changes in vision or hearing, sinus congestion, difficulty swallowing Pulmonary: Negative; No cough, wheezing, shortness of breath, hemoptysis Cardiovascular: See history of present illness; No chest pain, presyncope, syncope, palpitations GI: Negative; No nausea, vomiting, diarrhea, or abdominal pain GU: Negative; No dysuria, hematuria, or difficulty voiding Musculoskeletal: Negative; no myalgias, joint pain, or weakness Hematologic/Oncology: Negative; no easy bruising, bleeding Endocrine: Negative; no heat/cold intolerance; no diabetes Neuro: Negative; no changes in balance, headaches Skin: Negative; No rashes or skin lesions Psychiatric: Negative; No behavioral problems, depression Sleep: Negative; No snoring, daytime sleepiness, hypersomnolence, bruxism, restless legs, hypnogognic hallucinations, no cataplexy Other comprehensive 14 point system review is negative.   PE BP 138/90   Pulse (!) 58   Ht '5\' 6"'  (1.676 m)   Wt 193 lb 6.4 oz (87.7 kg)   BMI 31.22 kg/m    Repeat blood pressure by me was 124/72  Wt Readings from Last 3 Encounters:  05/10/20 193 lb 6.4 oz (87.7 kg)  02/12/19 191 lb (86.6 kg)  06/30/18 191 lb 9.6 oz (86.9 kg)   General: Alert, oriented, no distress.  Skin: normal turgor, no rashes, warm and dry HEENT: Normocephalic, atraumatic. Pupils equal round and reactive to light; sclera anicteric; extraocular muscles intact;  Nose without nasal septal hypertrophy  3 Neck: No JVD, no carotid bruits; normal carotid upstroke Lungs: clear to ausculatation and percussion; no wheezing or rales Chest wall: without tenderness to palpitation Heart: PMI not displaced, RRR, s1 s2 normal, 1/6 systolic murmur, no diastolic murmur, no rubs, gallops, thrills, or heaves Abdomen: soft, nontender; no hepatosplenomehaly, BS+; abdominal aorta nontender and  not dilated by palpation. Back: no CVA tenderness Pulses 2+ Musculoskeletal: full range of motion, normal strength, no joint deformities Extremities: no clubbing cyanosis or edema, Homan's sign negative  Neurologic: grossly nonfocal; Cranial nerves grossly wnl Psychologic: Normal mood and affect   ECG (independently read by me): Sinus bradycardia at 58 bpm.  First-degree AV block with a PR interval of 224 ms.  T wave abnormality in lead III and aVF.  Poor R wave  progression V1 through V3.  QTc interval 392 ms.  October 2020 ECG (independently read by me): Sinus Bradycardia at 56, First degree AV block; PR 224 msec  February 2020 ECG (independently read by me): Sinus Bradycardia 57, First degree AV Block; PR 226 msec  October 2019 ECG (independently read by me): Sinus bradycardia 55 bpm.  First-degree AV block with a PR interval 218 ms.  QS complex V1 V2.  January 2018 ECG (independently read by me): Normal sinus rhythm at 60 bpm.  Milliseconds.  No ST segment changes.  July 2017 ECG (independently read by me): Normal sinus rhythm with first-degree AV block.  Mild T wave abnormality in lead 3  October ECG (independently read by me): normal sinus rhythm at 65 bpm..  Probable left posterior hemiblock.  Probably progression V1 through V4.  May 2016 ECG (independently read by me): Normal sinus rhythm at 75 bpm.  First-degree AV block with a PR interval 206 ms.  T-wave inversion in leads 3 and mildly in aVF.  September 2015 ECG (independently read by me): Normal sinus rhythm with one isolated PVC.  QTc interval 397 ms.  PR interval 190 ms.  January 2015 ECG (independently read by me): Normal sinus rhythm at 60 beats per minute. Normal intervals.  Previous ECG from 12/04/2012 : Sinus bradycardia 53 beats per minute period. PR interval 196; QTc interval 377 ms  LABS: BMP Latest Ref Rng & Units 02/12/2019 04/01/2018 03/10/2018  Glucose 65 - 99 mg/dL 75 84 83  BUN 8 - 27 mg/dL '13 14 15   ' Creatinine 0.76 - 1.27 mg/dL 1.36(H) 1.23 1.56(H)  BUN/Creat Ratio 10 - '24 10 11 10  ' Sodium 134 - 144 mmol/L 137 135 135  Potassium 3.5 - 5.2 mmol/L 4.6 4.7 5.2  Chloride 96 - 106 mmol/L 100 98 96  CO2 20 - 29 mmol/L '24 21 24  ' Calcium 8.6 - 10.2 mg/dL 10.6(H) 9.9 10.1   Hepatic Function Latest Ref Rng & Units 02/12/2019 06/23/2018 02/17/2018  Total Protein 6.0 - 8.5 g/dL 8.2 7.2 8.0  Albumin 3.8 - 4.8 g/dL 4.4 4.0 4.4  AST 0 - 40 IU/L '29 22 30  ' ALT 0 - 44 IU/L '17 14 21  ' Alk Phosphatase 39 - 117 IU/L 116 94 113  Total Bilirubin 0.0 - 1.2 mg/dL 0.6 0.3 0.5  Bilirubin, Direct 0.00 - 0.40 mg/dL - 0.11 -   CBC Latest Ref Rng & Units 02/17/2018 06/18/2016 09/04/2015  WBC 3.4 - 10.8 x10E3/uL 7.8 6.2 5.5  Hemoglobin 13.0 - 17.7 g/dL 13.8 14.2 13.4  Hematocrit 37.5 - 51.0 % 42.6 42.1 39.6  Platelets 150 - 450 x10E3/uL 247 223 189   Lab Results  Component Value Date   TSH 4.410 02/17/2018   Lipid Panel     Component Value Date/Time   CHOL 109 06/23/2018 1155   TRIG 85 06/23/2018 1155   HDL 35 (L) 06/23/2018 1155   CHOLHDL 3.1 06/23/2018 1155   CHOLHDL 3.9 09/05/2016 1335   VLDL 30 09/05/2016 1335   LDLCALC 57 06/23/2018 1155   BNP    Component Value Date/Time   PROBNP 1,662.0 (H) 10/06/2011 8677     RADIOLOGY: No results found.  IMPRESSION:  1. Essential hypertension   2. LVH (left ventricular hypertrophy)   3. h/o Nonischemic cardiomyopathy ([place]): resolved   4. Hyperlipidemia LDL goal <70   5. Ascending aorta dilation ([facility])   6. Medication management     ASSESSMENT AND PLAN: Mr. Thomas Bullock is a 70  year-old African-American male who has a long-standing history of hypertension and a history of a nonischemic cardiomyopathy who presented in pulmonary edema after he had stopped his blood pressure medications for several months in May 2013. Since that time on medical therapy his ejection fraction improved from 20% to 60-65% as noted on his echo of 01/26/2014.  He has moderate  dilatation of his ascending aorta.  Pulmonary pressures were normal.  When I  saw him in October 2019 he had stage II hypertension since that time he is undergone several dose titrations of his medical management.  Presently, his blood pressure remains stable and repeat by me today was 124/72 although it was mildly increased on presentation.  He continues to be on amlodipine 5 mg, carvedilol 25 mg twice a day, furosemide 20 mg daily, spironolactone 25 mg twice a day, in addition to Bidi dosing of hydralazine 75 mg 3 times a day and isosorbide dinitrate 20 mg 3 times a day.  He also continues to be on atorvastatin 80 mg daily.  He has not had recent laboratory and I have recommended he undergo a comprehensive metabolic panel, CBC, TSH, and fasting lipid studies.  When I had last seen him I had recommended he undergo a follow-up echo Doppler study and CT imaging to assess his ascending aortic aneurysm which had measured 4.6 cm.  It does not appear that these have been done.  He tells me he will be undergoing a colonoscopy in several weeks with [facility].  I will contact him regarding his laboratory the medication adjustment will be made if necessary.  We will try to schedule him for follow-up imaging studies prior to his  next evaluation with me. He continues to be followed by Dr. Evie Lacks.  Troy Sine, MD, [facility]  05/12/2020 11:21 AM

## 2020-05-12 ENCOUNTER — Encounter: Payer: Self-pay | Admitting: Cardiovascular Disease

## 2020-05-17 LAB — COMPREHENSIVE METABOLIC PANEL
ALT: 12 IU/L (ref 0–44)
AST: 23 IU/L (ref 0–40)
Albumin/Globulin Ratio: 1.2 (ref 1.2–2.2)
Albumin: 4.4 g/dL (ref 3.8–4.8)
Alkaline Phosphatase: 86 IU/L (ref 44–121)
BUN/Creatinine Ratio: 14 (ref 10–24)
BUN: 16 mg/dL (ref 8–27)
Bilirubin Total: 0.5 mg/dL (ref 0.0–1.2)
CO2: 20 mmol/L (ref 20–29)
Calcium: 9.9 mg/dL (ref 8.6–10.2)
Chloride: 101 mmol/L (ref 96–106)
Creatinine, Ser: 1.13 mg/dL (ref 0.76–1.27)
GFR calc Af Amer: 76 mL/min/{1.73_m2} (ref >59)
GFR calc non Af Amer: 66 mL/min/{1.73_m2} (ref >59)
Globulin, Total: 3.6 g/dL (ref 1.5–4.5)
Glucose: 86 mg/dL (ref 65–99)
Potassium: 4.4 mmol/L (ref 3.5–5.2)
Sodium: 137 mmol/L (ref 134–144)
Total Protein: 8 g/dL (ref 6.0–8.5)

## 2020-05-17 LAB — CBC
Hematocrit: 39.8 % (ref 37.5–51.0)
Hemoglobin: 13.2 g/dL (ref 13.0–17.7)
MCH: 25.7 pg — ABNORMAL LOW (ref 26.6–33.0)
MCHC: 33.2 g/dL (ref 31.5–35.7)
MCV: 78 fL — ABNORMAL LOW (ref 79–97)
Platelets: 205 10*3/uL (ref 150–450)
RBC: 5.13 x10E6/uL (ref 4.14–5.80)
RDW: 14 % (ref 11.6–15.4)
WBC: 9.7 10*3/uL (ref 3.4–10.8)

## 2020-05-17 LAB — LIPID PANEL
Chol/HDL Ratio: 3.5 ratio (ref 0.0–5.0)
Cholesterol, Total: 137 mg/dL (ref 100–199)
HDL: 39 mg/dL — ABNORMAL LOW (ref >39)
LDL Chol Calc (NIH): 79 mg/dL (ref 0–99)
Triglycerides: 100 mg/dL (ref 0–149)
VLDL Cholesterol Cal: 19 mg/dL (ref 5–40)

## 2020-05-17 LAB — TSH: TSH: 4.79 u[IU]/mL — ABNORMAL HIGH (ref 0.450–4.500)

## 2020-05-30 ENCOUNTER — Ambulatory Visit
Admission: RE | Admit: 2020-05-30 | Discharge: 2020-05-30 | Disposition: A | Discharge status: Home / Self Care | Payer: Medicare Other | Source: Ambulatory Visit | Attending: Gastroenterology | Admitting: Gastroenterology

## 2020-05-30 DIAGNOSIS — K7400 Hepatic fibrosis, unspecified: Secondary | ICD-10-CM

## 2020-06-08 ENCOUNTER — Other Ambulatory Visit: Payer: Self-pay | Admitting: Cardiovascular Disease

## 2020-07-12 ENCOUNTER — Other Ambulatory Visit: Payer: Self-pay | Admitting: Cardiovascular Disease

## 2020-08-23 ENCOUNTER — Other Ambulatory Visit: Payer: Self-pay | Admitting: Cardiovascular Disease

## 2020-09-20 ENCOUNTER — Other Ambulatory Visit: Payer: Self-pay | Admitting: Cardiovascular Disease

## 2020-10-17 ENCOUNTER — Other Ambulatory Visit: Payer: Self-pay

## 2020-10-17 MED ORDER — ATORVASTATIN CALCIUM 80 MG PO TABS: 80.0000 mg | ORAL_TABLET | Freq: Every day | ORAL | 3 refills | Status: DC

## 2020-11-09 ENCOUNTER — Other Ambulatory Visit: Payer: Self-pay | Admitting: Cardiovascular Disease

## 2021-01-22 ENCOUNTER — Other Ambulatory Visit: Payer: Self-pay | Admitting: Cardiovascular Disease

## 2021-03-09 ENCOUNTER — Other Ambulatory Visit: Payer: Self-pay | Admitting: Cardiovascular Disease

## 2021-03-23 ENCOUNTER — Other Ambulatory Visit: Payer: Self-pay | Admitting: Cardiovascular Disease

## 2021-06-11 ENCOUNTER — Other Ambulatory Visit: Payer: Self-pay | Admitting: Cardiovascular Disease

## 2021-06-13 ENCOUNTER — Ambulatory Visit: Payer: Medicare Other | Admitting: Cardiovascular Disease

## 2021-06-30 ENCOUNTER — Other Ambulatory Visit: Payer: Self-pay | Admitting: Cardiovascular Disease

## 2021-08-13 ENCOUNTER — Other Ambulatory Visit: Payer: Self-pay | Admitting: Cardiovascular Disease

## 2021-08-23 ENCOUNTER — Encounter: Payer: Self-pay | Admitting: Cardiovascular Disease

## 2021-08-23 ENCOUNTER — Ambulatory Visit (INDEPENDENT_AMBULATORY_CARE_PROVIDER_SITE_OTHER): Payer: Medicare Other | Admitting: Cardiovascular Disease

## 2021-08-23 DIAGNOSIS — I77810 Thoracic aortic ectasia: Secondary | ICD-10-CM

## 2021-08-23 DIAGNOSIS — I428 Other cardiomyopathies: Secondary | ICD-10-CM | POA: Diagnosis not present

## 2021-08-23 DIAGNOSIS — I10 Essential (primary) hypertension: Secondary | ICD-10-CM

## 2021-08-23 DIAGNOSIS — E785 Hyperlipidemia, unspecified: Secondary | ICD-10-CM

## 2021-08-23 DIAGNOSIS — Z79899 Other long term (current) drug therapy: Secondary | ICD-10-CM

## 2021-08-23 DIAGNOSIS — N529 Male erectile dysfunction, unspecified: Secondary | ICD-10-CM

## 2021-08-23 NOTE — Patient Instructions (Signed)
Medication Instructions:  ?The current medical regimen is effective;  continue present plan and medications as directed. Please refer to the Current Medication list given to you today.  ? ?*If you need a refill on your cardiac medications before your next appointment, please call your pharmacy* ? ?Lab Work:    ?FASTING LIPID, CBC, TSH AND CMET     ?If you have labs (blood work) drawn today and your tests are completely normal, you will receive your results only by: ?MyChart Message (if you have MyChart) OR  A paper copy in the mail ?If you have any lab test that is abnormal or we need to change your treatment, we will call you to review the results. ? ?Follow-Up: ?Your next appointment:  12 month(s) In Person with Nicki Guadalajara, MD    ? ?Please call our office 2 months in advance to schedule this appointment  ? ?At [facility], you and your health needs are our priority.  As part of our continuing mission to provide you with exceptional heart care, we have created designated Provider Care Teams.  These Care Teams include your primary Cardiologist (physician) and Advanced Practice Providers (APPs -  Physician Assistants and Nurse Practitioners) who all work together to provide you with the care you need, when you need it. ? ?We recommend signing up for the patient portal called "MyChart".  Sign up information is provided on this After Visit Summary.  MyChart is used to connect with patients for Virtual Visits (Telemedicine).  Patients are able to view lab/test results, encounter notes, upcoming appointments, etc.  Non-urgent messages can be sent to your provider as well.   ?To learn more about what you can do with MyChart, go to [email or web address].   ? ? ? ?Important Information About Sugar ? ? ? ? ? ? ? ?  ? ?

## 2021-08-23 NOTE — Progress Notes (Signed)
Patient ID: Thomas Bullock, male   DOB: 1951/03/01, 71 y.o.   MRN: [id number] ? ? ? ?Primary MD: Dr. Jarold Song ? ?HPI: Thomas Bullock is a 71 y.o. male with a history of a nonischemic cardiomyopathy who presents to the office for a 15 month follow-up cardiology evaluation.  ? ?Thomas Bullock a long-standing history of hypertension. In May 2013 after being off his medications for some time he presented to [facility] in the setting of acute pulmonary edema. He was found to have a nonischemic cardiomyopathy with an ejection fraction of 20-25% and minimal CAD with 20% narrowing in the LAD noted at catheterization.With aggressive medical therapy and gradual titration of his medications  LV function has improved. An echo Doppler study in September 2013 showed an ejection fraction of 45% and an echo Doppler study on 01/26/2014 revealed complete normalization of LV function with an ejection fraction of 60-65%.  There was grade 1 diastolic dysfunction.  He had a determine LV filling pressure.  PA pressure was normal at 11 mm.  His  ascending aorta was moderately dilated up to 4.7 cm. ?  ?He was hospitalized in April 2017 and was felt to have a TIA.  He presented with vertigo and double vision.  An MRI of the brain did not show definitive evidence of an acute stroke.  There was some evidence of low flow.  The right vertebral artery, which was confirmed by carotid Doppler study.  He underwent a 2-D echo Doppler study on 09/06/2015 which showed an EF of 55-60% with grade 1 diastolic dysfunction.  There was mild-to-moderate aortic insufficiency.  There was no evidence for cardiac source of TIA.  He was subtotally seen in neurology follow-up by Dr. Jannifer Bullock in June.  Presently he denies episodes of chest pain.  He has continued to do well the he denies any significant shortness of breath but still notes  mild shortness of breath with activity. He denies   palpitations, presyncope  syncope or arrhythmia.  He admits to occasional  episodes of indigestion and has been taking over-the-counter Tums for this with plus minus benefit.  He denies any edema.  He denies PND, orthopnea. ? ?I saw him in January 2018 at which time he was doing well without chest pain shortness of breath or palpitations. At that time I further titrated Spironolactone to 25 mg twice a day, decrease furosemide to 20 mg daily and recommended he reduce his aspirin down to 81 mg.  Over the past several years he has been without chest pain or significant swelling.  He has not had a recent echo.  Most recently he has only been taking the Spironolactone 12.5 mg twice a day and he is continued to be on isosorbide/hydralazine 20/37.5 3 times daily he is continued to be on digoxin 0.0625 mg.  ? ?When I saw him in October 2019 his blood pressure was significantly elevated at 170/100.  I further titrated hydralazine to 50 mg every 8 hours.  I increase spironolactone to 25 mg twice a day.  6 arrange follow-up visit in over the past reassessments with Thomas Bullock, PharmD  And Thomas Bullock, [facility], his medications have been further titrated.  He is now on amlodipine 5 mg daily, carvedilol 25 mg twice a day, furosemide 20 mg daily, hydralazine 75 mg 3 times a day isosorbide dinitrate 20 mg 3 times a day in addition to his pantoprazole and atorvastatin 80 mg and aspirin.  He underwent follow-up echo Doppler study which showed  EF at 50 to 55% in October 2019.  His ascending aorta was dilated at 47 mm.  He subsequently underwent CT angiography of his aorta March 19, 2018 which showed mild dilation of the aortic root measuring 4.1 x 4.2 cm at the level of the sinus of Valsalva.  There was aneurysmal disease of the ascending thoracic aorta measuring 4.6 cm in greatest diameter.  He was also noted of coronary atherosclerosis with mild amount of calcified plaque in the proximal LAD and minimal calcified plaque in the proximal circumflex.  He felt well on his increased medical regimen.   ? ?I  saw him in February 2020 and prior to that evaluation he had undergone several dose titrations of his medical management.  His blood pressure was significantly improved on a regimen consisting of amlodipine 5 mg, carvedilol 25 mg twice a day, hydralazine 75 mg 3 times a day, isosorbide dinitrate 20 mg 3 times a day, furosemide 20 mg daily, and spironolactone 25 mg twice a day.  Since his echo Doppler study showed an EF of 50 to 55%, I recommended discontinuance of digoxin.  He has continued to be on atorvastatin 80 mg for hyperlipidemia.   ? ?I saw him in October 2020.  At that time he continued to feel well.  His blood pressure was well controlled and he continued to be on amlodipine 5 mg, carvedilol 25 mg twice a day, furosemide 20 mg daily, hydralazine 75 mg 3 times a day with isosorbide dinitrate 20 mg 3 times per day (by double dosing) in addition to spironolactone 25 mg daily.  He continued to be on atorvastatin 80 mg with LDL cholesterol at 57.   ? ?I last saw him on May 10, 2020.  At that time he remained stable and specifically denied any chest pain or shortness of breath.  He did not have any edema.  He denied PND orthopnea or palpitations.  He has not had recent laboratory.  He subsequently underwent laboratory on May 17, 2020 which showed a total cholesterol 137, HDL 39, LDL 79 and triglycerides 100.  Potassium was 4.4.  TSH 4.7.  Hemoglobin 13.2. ? ?Since I last saw him, he has continued to be stable.  He specifically denies chest pain or shortness of breath.  He walks occasionally.  He is unaware of palpitations, PND orthopnea.  He has not had any laboratory since January 2022.  He has had some erectile function issues.  He presents for evaluation. ? ?Past Medical History:  ?Diagnosis Date  ? Acute pulmonary edema ([place]) 10/04/2011  ? Coronary artery disease   ? GERD (gastroesophageal reflux disease) 10/04/2011  ? Hypertension   ? LVH (left ventricular hypertrophy) 10/04/2011  ? Systolic CHF ([place])  [id number]  ? Hospitalized for flash pulmonary edema in June 2012 due to malignant HTN.   EF was 20-25% with severe global hypokinesis. left heart cath which revealed an LAD with 20% smooth narrowing but otherwise normal coronaries. On ace inhibitor, spironalactone, carvedilol. Diuresed in hospital.  Life Vest was ordered and fitted by [employer] to wear until September.     ? ? ?Past Surgical History:  ?Procedure Laterality Date  ? CHOLECYSTECTOMY    ? CHOLECYSTECTOMY, LAPAROSCOPIC  2006  ? DENTAL SURGERY    ? LEFT HEART CATHETERIZATION WITH CORONARY ANGIOGRAM N/A 10/05/2011  ? Procedure: LEFT HEART CATHETERIZATION WITH CORONARY ANGIOGRAM;  Surgeon: Troy Sine, MD;  Location[facility] CATH LAB;  Service: Cardiovascular;  Laterality: N/A;  ? RIGHT HEART CATHETERIZATION  10/05/2011  ? Procedure: RIGHT HEART CATH;  Surgeon: Troy Sine, MD;  Location: [facility] CATH LAB;  Service: Cardiovascular;;  ? ? ?No Known Allergies ? ?Current Outpatient Medications  ?Medication Sig Dispense Refill  ? amLODipine (NORVASC) 5 MG tablet Take 1 tablet by mouth once daily 90 tablet 0  ? aspirin EC 81 MG tablet Take 1 tablet (81 mg total) by mouth daily. 90 tablet 3  ? atorvastatin (LIPITOR) 80 MG tablet Take 1 tablet (80 mg total) by mouth daily. 90 tablet 3  ? carvedilol (COREG) 25 MG tablet TAKE 1 TABLET BY MOUTH TWICE DAILY WITH A MEAL 180 tablet 2  ? furosemide (LASIX) 40 MG tablet TAKE ONE-HALF  TABLETS BY MOUTH ONCE DAILY IN THE AFTERNOON. PLEASE SCHEDULE AN APPT FOR FUTURE REFILLS. 45 tablet 2  ? hydrALAZINE (APRESOLINE) 50 MG tablet TAKE 1 & 1/2 (ONE & ONE-HALF) TABLETS BY MOUTH THREE TIMES DAILY 315 tablet 0  ? isosorbide dinitrate (ISORDIL) 20 MG tablet TAKE 1 TABLET BY MOUTH THREE TIMES DAILY 270 tablet 0  ? isosorbide dinitrate (ISORDIL) 20 MG tablet TAKE 1 TABLET BY MOUTH THREE TIMES DAILY 270 tablet 0  ? latanoprost (XALATAN) 0.005 % ophthalmic solution SMARTSIG:In Eye(s)    ? pantoprazole (PROTONIX) 40 MG tablet TAKE 1 TABLET BY MOUTH  ONCE DAILY. PATIENT NEEDS TO KEEP APPT IN JAN 2022 FOR FURTHER REFILLS 90 tablet 3  ? spironolactone (ALDACTONE) 25 MG tablet Take 1 tablet (25 mg total) by mouth 2 (two) times daily. PATIENT MUST ATTEND

## 2021-08-26 LAB — LIPID PANEL
Chol/HDL Ratio: 3.3 ratio (ref 0.0–5.0)
Cholesterol, Total: 131 mg/dL (ref 100–199)
HDL: 40 mg/dL (ref >39)
LDL Chol Calc (NIH): 75 mg/dL (ref 0–99)
Triglycerides: 81 mg/dL (ref 0–149)
VLDL Cholesterol Cal: 16 mg/dL (ref 5–40)

## 2021-08-26 LAB — COMPREHENSIVE METABOLIC PANEL
ALT: 18 IU/L (ref 0–44)
AST: 29 IU/L (ref 0–40)
Albumin/Globulin Ratio: 1.3 (ref 1.2–2.2)
Albumin: 4.5 g/dL (ref 3.8–4.8)
Alkaline Phosphatase: 111 IU/L (ref 44–121)
BUN/Creatinine Ratio: 8 — ABNORMAL LOW (ref 10–24)
BUN: 9 mg/dL (ref 8–27)
Bilirubin Total: 0.4 mg/dL (ref 0.0–1.2)
CO2: 22 mmol/L (ref 20–29)
Calcium: 10 mg/dL (ref 8.6–10.2)
Chloride: 101 mmol/L (ref 96–106)
Creatinine, Ser: 1.06 mg/dL (ref 0.76–1.27)
Globulin, Total: 3.5 g/dL (ref 1.5–4.5)
Glucose: 75 mg/dL (ref 70–99)
Potassium: 4.4 mmol/L (ref 3.5–5.2)
Sodium: 136 mmol/L (ref 134–144)
Total Protein: 8 g/dL (ref 6.0–8.5)
eGFR: 75 mL/min/{1.73_m2} (ref >59)

## 2021-08-26 LAB — CBC
Hematocrit: 38.6 % (ref 37.5–51.0)
Hemoglobin: 13.5 g/dL (ref 13.0–17.7)
MCH: 27.8 pg (ref 26.6–33.0)
MCHC: 35 g/dL (ref 31.5–35.7)
MCV: 80 fL (ref 79–97)
Platelets: 213 10*3/uL (ref 150–450)
RBC: 4.85 x10E6/uL (ref 4.14–5.80)
RDW: 15.4 % (ref 11.6–15.4)
WBC: 6.7 10*3/uL (ref 3.4–10.8)

## 2021-08-26 LAB — TSH: TSH: 2.75 u[IU]/mL (ref 0.450–4.500)

## 2021-08-28 ENCOUNTER — Encounter: Payer: Self-pay | Admitting: *Deleted

## 2021-09-21 ENCOUNTER — Other Ambulatory Visit: Payer: Self-pay | Admitting: Cardiovascular Disease

## 2021-10-05 ENCOUNTER — Other Ambulatory Visit: Payer: Self-pay

## 2021-10-05 ENCOUNTER — Other Ambulatory Visit: Payer: Self-pay | Admitting: Cardiovascular Disease

## 2021-10-19 ENCOUNTER — Other Ambulatory Visit: Payer: Self-pay | Admitting: Cardiovascular Disease

## 2021-11-29 ENCOUNTER — Other Ambulatory Visit: Payer: Self-pay | Admitting: Cardiovascular Disease

## 2021-12-22 ENCOUNTER — Other Ambulatory Visit: Payer: Self-pay | Admitting: Cardiovascular Disease

## 2022-03-09 ENCOUNTER — Other Ambulatory Visit: Payer: Self-pay | Admitting: Cardiovascular Disease

## 2022-04-11 ENCOUNTER — Other Ambulatory Visit: Payer: Self-pay | Admitting: Cardiovascular Disease

## 2022-04-13 ENCOUNTER — Other Ambulatory Visit: Payer: Self-pay | Admitting: Cardiovascular Disease

## 2022-08-26 ENCOUNTER — Other Ambulatory Visit: Payer: Self-pay | Admitting: Cardiovascular Disease

## 2022-09-13 ENCOUNTER — Ambulatory Visit: Payer: 59 | Admitting: Nurse Practitioner

## 2022-09-14 NOTE — Progress Notes (Signed)
Cardiology Office Note:    Date:  09/25/2022   ID:  Thomas Bullock, DOB 08-Jun-1950, MRN [id number]  PCP:  Hoy Register, MD  Cardiologist:  Nicki Guadalajara, MD  Electrophysiologist:  None   Referring MD: Hoy Register, MD   Chief Complaint: routine follow-up of CHF  History of Present Illness:    Thomas Bullock is a 72 y.o. male with a history of minimal CAD on cardiac catheterization in 09/2011, non-ischemic cardiomyopathy/ chronic HFrEF with EF as low as 20-25% in 09/2011 with subsequent normalization, ascending thoracic aortic aneurysm, hypertension, stroke in 08/2015, vertigo, and GERD who is followed by Dr. Tresa Endo and presents today for routine follow-up.   Patient has a long history of hypertension. He was admitted in 09/2011 with acute CHF and acute pulmonary edema in the setting of hypertensive emergency after being off his medications for a while. Echo showed LVEF of 25% with mild LVH. Cardiac catheterization at that time showed minimal CAD. Cardiomyopathy was felt to be due to uncontrolled hypertension. He was started on GDMT and EF improved to 45% on repeat Echo in 01/2012. He was admitted with acute stroke in 08/2015. Echo at that time showed LVEF of 55-60% with no regional wall motion abnormalities, severe LVH of the septum and mild posterior wall, and grade 1 diastolic dysfunction as well as mild to moderate AI. Carotid dopplers showed no significant carotid artery disease. Last Echo in 02/2018 showed LVEF of 50-55% with severe LVH of the septum and grade 1 diastolic dysfunction , only trivial AI, and dilated ascending aorta measuring 47 mm. Chest CTA for further evaluation of his aorta showed mild dilatation of the aortic root measuring 4.1 to 4.2 cm at the level of the sinuses of Valsalva and aneurysmal disease of the ascending aorta measuring 4.6 cm.   Patient was last seen by Dr. Tresa Endo in 08/2021 at which time he was doing well from a cardiac standpoint.   Patient presents  today for follow-up. Patient is doing well from a cardiac standpoint. He denies any chest pain, shortness of breath, orthopnea, PND, edema, palpitations, lightheadedness, dizziness, or syncope. He reports feeling a little fatigued but this is not new. He also describe some indigestion if he eats and then lays down too quickly. He takes over the counter ingestions medications and tries to sit upright for a while after he eats which helps. However, no other chest discomfort that sounds like angina.   Past Medical History:  Diagnosis Date   Acute pulmonary edema (HCC) 10/04/2011   Coronary artery disease    GERD (gastroesophageal reflux disease) 10/04/2011   Hypertension    LVH (left ventricular hypertrophy) 10/04/2011   Systolic CHF (HCC) 11/07/2011   Hospitalized for flash pulmonary edema in June 2012 due to malignant HTN.   EF was 20-25% with severe global hypokinesis. left heart cath which revealed an LAD with 20% smooth narrowing but otherwise normal coronaries. On ace inhibitor, spironalactone, carvedilol. Diuresed in hospital.  Life Vest was ordered and fitted by [employer] to wear until September.       Past Surgical History:  Procedure Laterality Date   CHOLECYSTECTOMY     CHOLECYSTECTOMY, LAPAROSCOPIC  2006   DENTAL SURGERY     LEFT HEART CATHETERIZATION WITH CORONARY ANGIOGRAM N/A 10/05/2011   Procedure: LEFT HEART CATHETERIZATION WITH CORONARY ANGIOGRAM;  Surgeon: Lennette Bihari, MD;  Location: [facility] CATH LAB;  Service: Cardiovascular;  Laterality: N/A;   RIGHT HEART CATHETERIZATION  10/05/2011   Procedure: RIGHT  HEART CATH;  Surgeon: Lennette Bihari, MD;  Location: [facility] CATH LAB;  Service: Cardiovascular;;    Current Medications: Current Meds  Medication Sig   amLODipine (NORVASC) 5 MG tablet Take 1 tablet by mouth once daily   aspirin EC 81 MG tablet Take 1 tablet (81 mg total) by mouth daily.   atorvastatin (LIPITOR) 80 MG tablet Take 1 tablet by mouth once daily   carvedilol (COREG) 25 MG  tablet Take 1 tablet (25 mg total) by mouth 2 (two) times daily with a meal. TAKE 1 TABLET BY MOUTH TWICE DAILY WITH  A  MEAL   furosemide (LASIX) 40 MG tablet TAKE 1/2 (ONE-HALF) TABLET BY MOUTH ONCE DAILY IN THE EVENING . APPOINTMENT REQUIRED FOR FUTURE REFILLS   hydrALAZINE (APRESOLINE) 50 MG tablet TAKE 1 & 1/2 (ONE & ONE-HALF) TABLETS BY MOUTH THREE TIMES DAILY   isosorbide dinitrate (ISORDIL) 20 MG tablet TAKE 1 TABLET BY MOUTH THREE TIMES DAILY   latanoprost (XALATAN) 0.005 % ophthalmic solution SMARTSIG:In Eye(s)   pantoprazole (PROTONIX) 40 MG tablet Take 1 tablet by mouth once daily   spironolactone (ALDACTONE) 25 MG tablet Take 1 tablet (25 mg total) by mouth daily.   TRAVATAN Z 0.004 % SOLN ophthalmic solution      Allergies:   Patient has no known allergies.   Social History   Socioeconomic History   Marital status: Single    Spouse name: Not on file   Number of children: 0   Years of education: Not on file   Highest education level: Not on file  Occupational History   Not on file  Tobacco Use   Smoking status: Former    Types: Cigarettes    Quit date: 05/07/1973    Years since quitting: 49.4   Smokeless tobacco: Never   Tobacco comments:    on 11/07/11 patient states never smoker.   Substance and Sexual Activity   Alcohol use: Yes    Alcohol/week: 2.0 standard drinks of alcohol    Types: 2 Cans of beer per week    Comment: weekends   Drug use: No   Sexual activity: Yes  Other Topics Concern   Not on file  Social History Narrative   Patient with finanacial difficulties. Unable to work at [employer] because of him having to wear lifevest. Will have job opportunity once off in September. Attempting orange card.       High school graduate but expresses has some difficulty reading at times if complicated.    Working at [employer] previously.       Cell phone [phone] not ok to leave message   Home phone [phone] ok to leave message.       Lives  with girlfriend. No kids. Friend helps with transport.    Social Determinants of Health   Financial Resource Strain: Not on file  Food Insecurity: Not on file  Transportation Needs: Not on file  Physical Activity: Not on file  Stress: Not on file  Social Connections: Not on file     Family History: The patient's family history includes Diabetes type II in his mother; Heart attack in his father; Heart disease in his brother and sister; Kidney disease in his mother; Stroke in his mother.  ROS:   Please see the history of present illness.     EKGs/Labs/Other Studies Reviewed:    The following studies were reviewed:  Echocardiogram 02/27/2018: Study Conclusions: - Left ventricle: The cavity size was normal. Severe  hypertrophy of    the septum (approximately 18 mm) with mild posterior wall    hypertrophy. Systolic function was normal. The estimated ejection    fraction was in the range of 50% to 55%. Wall motion was normal;    there were no regional wall motion abnormalities. Doppler    parameters are consistent with abnormal left ventricular    relaxation (grade 1 diastolic dysfunction). Doppler parameters    are consistent with high ventricular filling pressure (E/e&'   medial = 21).  - Aortic valve: Trileaflet; normal thickness leaflets.    Transvalvular velocity was within the normal range. There was no    stenosis. There was trivial regurgitation.  - Aortic root: The aortic root was normal in size when indexed to    BSA and age.  - Ascending aorta: The ascending aorta was dilated at 47 mm at the    proximal to mid level. See Recommendations.  - Mitral valve: Transvalvular velocity was within the normal range.    There was no evidence for stenosis. There was trivial    regurgitation.  - Left atrium: The atrium was normal in size.  - Right ventricle: The cavity size was normal. Wall thickness was    normal. Systolic function was normal. RV systolic pressure (S,    est): 13  mm Hg.  - Right atrium: The atrium was normal in size.  - Tricuspid valve: There was trivial regurgitation.  - Pulmonic valve: There was trivial regurgitation.  - Inferior vena cava: The vessel was normal in size. The    respirophasic diameter changes were in the normal range (= 50%),    consistent with normal central venous pressure.  - Pericardium, extracardiac: There was no pericardial effusion.   Recommendations:  Consider ECG gated CT angiography of the aorta  for complete assessment of the dilation of the thoracic aorta.    EKG:  EKG ordered today. EKG personally reviewed and demonstrates sinus bradycardia, rate 59 bpm, with 1st degree AV block (PR interval 224 ms) and no acute ST/T changes. Left axis deviation. QTc 403 ms.  Recent Labs: No results found for requested labs within last 365 days.  Recent Lipid Panel    Component Value Date/Time   CHOL 131 08/25/2021 1136   TRIG 81 08/25/2021 1136   HDL 40 08/25/2021 1136   CHOLHDL 3.3 08/25/2021 1136   CHOLHDL 3.9 09/05/2016 1335   VLDL 30 09/05/2016 1335   LDLCALC 75 08/25/2021 1136    Physical Exam:    Vital Signs: BP 124/74 (BP Location: Left Arm, Patient Position: Sitting, Cuff Size: Normal)   Pulse (!) 59   Ht 5\' 6"  (1.676 m)   Wt 192 lb 6.4 oz (87.3 kg)   SpO2 99%   BMI 31.05 kg/m     Wt Readings from Last 3 Encounters:  09/25/22 192 lb 6.4 oz (87.3 kg)  08/23/21 192 lb 9.6 oz (87.4 kg)  05/10/20 193 lb 6.4 oz (87.7 kg)     General: 72 y.o. African-American male in no acute distress. HEENT: Normocephalic and atraumatic. Sclera clear.  Neck: Supple. No carotid bruits. No JVD. Heart: RRR. Distinct S1 and S2. Possible slight I/VI murmur. No gallops or rubs.  Lungs: No increased work of breathing. Clear to ausculation bilaterally. No wheezes, rhonchi, or rales.  Abdomen: Soft, non-distended, and non-tender to palpation.  Extremities: No lower extremity edema.    Skin: Warm and dry. Neuro: Alert and oriented  x3. No focal deficits. Psych: Normal affect.  Responds appropriately.  Assessment:    1. Non-ischemic cardiomyopathy (HCC)   2. Chronic HFrEF with Improved EF   3. Non-obstructive CAD   4. Aneurysm of ascending aorta without rupture (HCC)   5. Aortic valve insufficiency, etiology of cardiac valve disease unspecified   6. Primary hypertension   7. Hyperlipidemia, unspecified hyperlipidemia type     Plan:    Non-Ischemic Cardiomyopathy Chronic HFrEF with Improved EF Initially diagnosed in 09/2011 during admission for acute CHF in setting of hypertensive emergency. EF 25% at that time. LHC at that time showed only minimal CAD. Cardiomyopathy felt to be due to uncontrolled CHF. He was started on GDMT with subsequent normalization of EF. Last Echo in 02/2018 showed LVEF of 50-55% with severe LVH of the septum and grade 1 diastolic dysfunction. - Euvolemic on exam.  - Continue Lasix 20mg  daily.  - Continue Coreg 25mg  twice daily.  - Continue Spironolactone 25mg  daily.  - Continue Hydralazine 75mg  three times daily and Isordil 20mg  three times daily.  - Previously on  Benazepril but this was stopped due to renal function. Renal function has now improved.  - Continue to monitor daily weights and sodium/ fluid restrictions.  Minimal Non-Obstructive CAD LHC in 09/2011 showed only minimal CAD.  - No chest pain.  - Continue aspirin, beta-blocker, and statin.  Ascending Thoracic Aortic Aneurysm Echo in 02/2018 showed dilated ascending aorta measuring 47 mm. Chest CTA in 03/2018 showed mild dilatation of the aortic root measuring 4.1 to 4.2 cm at the level of the sinuses of Valsalva and aneurysmal disease of the ascending aorta measuring 4.6 cm. - Will repeat chest CTA.  Aortic Insufficiency Noted to have mild to moderate AI on Echo in 2017; however, only trivial AI on last Echo in 2019.   Hypertension BP well controlled.  - Continue Amlodipine 5mg  daily and GDMT for CHF as  above.  Hyperlipidemia Lipid panel in 08/2021: Total Cholesterol 131, Triglycerides 81, HDL 40, LDL 75. LDL <70 given history of stroke. - Continue Lipitor 80mg  daily.  - Patient is not fasting today so will come in for repeat lipid panel and CMET later this week.  Disposition: Follow up in 1 year.    Medication Adjustments/Labs and Tests Ordered: Current medicines are reviewed at length with the patient today.  Concerns regarding medicines are outlined above.  Orders Placed This Encounter  Procedures   CT ANGIO CHEST AORTA W/CM & OR WO/CM   Comprehensive metabolic panel   Lipid panel   EKG 12-Lead   No orders of the defined types were placed in this encounter.   Patient Instructions  Medication Instructions:  No Changes *If you need a refill on your cardiac medications before your next appointment, please call your pharmacy*   Lab Work: CMET, Lipid Panel  If you have labs (blood work) drawn today and your tests are completely normal, you will receive your results only by: MyChart Message (if you have MyChart) OR A paper copy in the mail If you have any lab test that is abnormal or we need to change your treatment, we will call you to review the results.   Testing/Procedures: [facility] . Non-Cardiac CT scanning, (CAT scanning), is a noninvasive, special x-ray that produces cross-sectional images of the body using x-rays and a computer. CT scans help physicians diagnose and treat medical conditions. For some CT exams, a contrast material is used to enhance visibility in the area of the body being studied. CT scans provide greater clarity  and reveal more details than regular x-ray exams.    Follow-Up: At [facility], you and your health needs are our priority.  As part of our continuing mission to provide you with exceptional heart care, we have created designated Provider Care Teams.  These Care Teams include your primary Cardiologist (physician) and  Advanced Practice Providers (APPs -  Physician Assistants and Nurse Practitioners) who all work together to provide you with the care you need, when you need it.  We recommend signing up for the patient portal called "MyChart".  Sign up information is provided on this After Visit Summary.  MyChart is used to connect with patients for Virtual Visits (Telemedicine).  Patients are able to view lab/test results, encounter notes, upcoming appointments, etc.  Non-urgent messages can be sent to your provider as well.   To learn more about what you can do with MyChart, go to [email or web address].    Your next appointment:   1 year(s)  Provider:   Nicki Guadalajara, MD     Signed, Corrin Parker, PA-C  09/25/2022 10:41 AM    [facility]

## 2022-09-25 ENCOUNTER — Encounter: Payer: Self-pay | Admitting: Student

## 2022-09-25 ENCOUNTER — Ambulatory Visit: Payer: 59 | Attending: Nurse Practitioner | Admitting: Student

## 2022-09-25 VITALS — BP 124/74 | HR 59 | Ht 66.0 in | Wt 192.4 lb

## 2022-09-25 DIAGNOSIS — E785 Hyperlipidemia, unspecified: Secondary | ICD-10-CM

## 2022-09-25 DIAGNOSIS — I5022 Chronic systolic (congestive) heart failure: Secondary | ICD-10-CM

## 2022-09-25 DIAGNOSIS — I351 Nonrheumatic aortic (valve) insufficiency: Secondary | ICD-10-CM

## 2022-09-25 DIAGNOSIS — I2510 Atherosclerotic heart disease of native coronary artery without angina pectoris: Secondary | ICD-10-CM

## 2022-09-25 DIAGNOSIS — I428 Other cardiomyopathies: Secondary | ICD-10-CM | POA: Diagnosis not present

## 2022-09-25 DIAGNOSIS — I7121 Aneurysm of the ascending aorta, without rupture: Secondary | ICD-10-CM

## 2022-09-25 DIAGNOSIS — I10 Essential (primary) hypertension: Secondary | ICD-10-CM

## 2022-09-25 NOTE — Patient Instructions (Signed)
Medication Instructions:  No Changes *If you need a refill on your cardiac medications before your next appointment, please call your pharmacy*   Lab Work: CMET, Lipid Panel  If you have labs (blood work) drawn today and your tests are completely normal, you will receive your results only by: MyChart Message (if you have MyChart) OR A paper copy in the mail If you have any lab test that is abnormal or we need to change your treatment, we will call you to review the results.   Testing/Procedures: [facility] . Non-Cardiac CT scanning, (CAT scanning), is a noninvasive, special x-ray that produces cross-sectional images of the body using x-rays and a computer. CT scans help physicians diagnose and treat medical conditions. For some CT exams, a contrast material is used to enhance visibility in the area of the body being studied. CT scans provide greater clarity and reveal more details than regular x-ray exams.    Follow-Up: At [facility], you and your health needs are our priority.  As part of our continuing mission to provide you with exceptional heart care, we have created designated Provider Care Teams.  These Care Teams include your primary Cardiologist (physician) and Advanced Practice Providers (APPs -  Physician Assistants and Nurse Practitioners) who all work together to provide you with the care you need, when you need it.  We recommend signing up for the patient portal called "MyChart".  Sign up information is provided on this After Visit Summary.  MyChart is used to connect with patients for Virtual Visits (Telemedicine).  Patients are able to view lab/test results, encounter notes, upcoming appointments, etc.  Non-urgent messages can be sent to your provider as well.   To learn more about what you can do with MyChart, go to [email or web address].    Your next appointment:   1 year(s)  Provider:   Nicki Guadalajara, MD

## 2022-09-28 LAB — LIPID PANEL
Chol/HDL Ratio: 3.4 ratio (ref 0.0–5.0)
Cholesterol, Total: 120 mg/dL (ref 100–199)
HDL: 35 mg/dL — ABNORMAL LOW (ref >39)
LDL Chol Calc (NIH): 68 mg/dL (ref 0–99)
Triglycerides: 90 mg/dL (ref 0–149)
VLDL Cholesterol Cal: 17 mg/dL (ref 5–40)

## 2022-09-28 LAB — COMPREHENSIVE METABOLIC PANEL
ALT: 27 IU/L (ref 0–44)
AST: 34 IU/L (ref 0–40)
Albumin/Globulin Ratio: 1.2 (ref 1.2–2.2)
Albumin: 4.1 g/dL (ref 3.8–4.8)
Alkaline Phosphatase: 94 IU/L (ref 44–121)
BUN/Creatinine Ratio: 11 (ref 10–24)
BUN: 14 mg/dL (ref 8–27)
Bilirubin Total: 0.5 mg/dL (ref 0.0–1.2)
CO2: 23 mmol/L (ref 20–29)
Calcium: 9.7 mg/dL (ref 8.6–10.2)
Chloride: 104 mmol/L (ref 96–106)
Creatinine, Ser: 1.29 mg/dL — ABNORMAL HIGH (ref 0.76–1.27)
Globulin, Total: 3.3 g/dL (ref 1.5–4.5)
Glucose: 89 mg/dL (ref 70–99)
Potassium: 4.3 mmol/L (ref 3.5–5.2)
Sodium: 139 mmol/L (ref 134–144)
Total Protein: 7.4 g/dL (ref 6.0–8.5)
eGFR: 59 mL/min/{1.73_m2} — ABNORMAL LOW (ref >59)

## 2022-10-12 ENCOUNTER — Ambulatory Visit (HOSPITAL_COMMUNITY)
Admission: RE | Admit: 2022-10-12 | Discharge: 2022-10-12 | Disposition: A | Discharge status: Home / Self Care | Payer: 59 | Source: Ambulatory Visit | Attending: Student | Admitting: Student

## 2022-10-12 DIAGNOSIS — I428 Other cardiomyopathies: Secondary | ICD-10-CM | POA: Diagnosis present

## 2022-10-12 DIAGNOSIS — I2510 Atherosclerotic heart disease of native coronary artery without angina pectoris: Secondary | ICD-10-CM | POA: Diagnosis present

## 2022-10-12 DIAGNOSIS — I7121 Aneurysm of the ascending aorta, without rupture: Secondary | ICD-10-CM | POA: Insufficient documentation

## 2022-10-12 DIAGNOSIS — I10 Essential (primary) hypertension: Secondary | ICD-10-CM | POA: Insufficient documentation

## 2022-10-12 DIAGNOSIS — I5022 Chronic systolic (congestive) heart failure: Secondary | ICD-10-CM | POA: Diagnosis present

## 2022-10-12 DIAGNOSIS — E785 Hyperlipidemia, unspecified: Secondary | ICD-10-CM | POA: Insufficient documentation

## 2022-10-12 DIAGNOSIS — I351 Nonrheumatic aortic (valve) insufficiency: Secondary | ICD-10-CM | POA: Insufficient documentation

## 2022-10-12 MED ORDER — IOHEXOL 350 MG/ML SOLN
75.0000 mL | Freq: Once | INTRAVENOUS | Status: AC | PRN
  Administered 2022-10-12: 75 mL via INTRAVENOUS

## 2022-10-18 ENCOUNTER — Other Ambulatory Visit: Payer: Self-pay

## 2022-10-18 ENCOUNTER — Other Ambulatory Visit: Payer: Self-pay | Admitting: Cardiovascular Disease

## 2022-10-18 MED ORDER — SPIRONOLACTONE 25 MG PO TABS: 25.0000 mg | ORAL_TABLET | Freq: Every day | ORAL | 3 refills | Status: DC

## 2022-11-02 ENCOUNTER — Other Ambulatory Visit: Payer: Self-pay | Admitting: Cardiovascular Disease

## 2022-11-30 ENCOUNTER — Other Ambulatory Visit: Payer: Self-pay | Admitting: Cardiovascular Disease

## 2023-01-24 ENCOUNTER — Other Ambulatory Visit: Payer: Self-pay | Admitting: Cardiovascular Disease

## 2023-01-25 ENCOUNTER — Other Ambulatory Visit: Payer: Self-pay | Admitting: Cardiovascular Disease

## 2023-01-30 ENCOUNTER — Other Ambulatory Visit: Payer: Self-pay | Admitting: Cardiovascular Disease

## 2023-03-31 ENCOUNTER — Other Ambulatory Visit: Payer: Self-pay | Admitting: Cardiovascular Disease

## 2023-04-30 ENCOUNTER — Other Ambulatory Visit: Payer: Self-pay | Admitting: Cardiovascular Disease

## 2023-07-01 ENCOUNTER — Other Ambulatory Visit: Payer: Self-pay | Admitting: Cardiovascular Disease

## 2023-07-01 ENCOUNTER — Other Ambulatory Visit: Payer: Self-pay

## 2023-07-01 MED ORDER — PANTOPRAZOLE SODIUM 40 MG PO TBEC: 40.0000 mg | DELAYED_RELEASE_TABLET | Freq: Every day | ORAL | 0 refills | Status: DC

## 2023-09-09 ENCOUNTER — Other Ambulatory Visit: Payer: Self-pay | Admitting: Cardiovascular Disease

## 2023-10-17 ENCOUNTER — Other Ambulatory Visit: Payer: Self-pay | Admitting: Cardiovascular Disease

## 2023-10-28 ENCOUNTER — Telehealth: Payer: Self-pay | Admitting: Cardiovascular Disease

## 2023-10-28 MED ORDER — ISOSORBIDE DINITRATE 20 MG PO TABS: 20.0000 mg | ORAL_TABLET | Freq: Three times a day (TID) | ORAL | 0 refills | Status: DC

## 2023-10-28 NOTE — Telephone Encounter (Signed)
*  STAT* If patient is at the pharmacy, call can be transferred to refill team.   1. Which medications need to be refilled? (please list name of each medication and dose if known)   isosorbide  dinitrate (ISORDIL ) 20 MG tablet     2. Would you like to learn more about the convenience, safety, & potential cost savings by using the [facility]?  no   3. Are you open to using the [facility] no   4. Which pharmacy/location (including street and city if local pharmacy) is medication to be sent to? [facility] - [place] ([place]), [place] - [address]    5. Do they need a 30 day or 90 day supply? 90   Pt has appt with Dr. Burnard on 06/25

## 2023-10-28 NOTE — Telephone Encounter (Signed)
 RX sent to requested Pharmacy

## 2023-10-30 ENCOUNTER — Ambulatory Visit: Attending: Cardiology | Admitting: Cardiovascular Disease

## 2023-10-30 ENCOUNTER — Encounter: Payer: Self-pay | Admitting: Cardiovascular Disease

## 2023-10-30 DIAGNOSIS — I10 Essential (primary) hypertension: Secondary | ICD-10-CM | POA: Diagnosis not present

## 2023-10-30 DIAGNOSIS — I428 Other cardiomyopathies: Secondary | ICD-10-CM | POA: Diagnosis not present

## 2023-10-30 DIAGNOSIS — I5022 Chronic systolic (congestive) heart failure: Secondary | ICD-10-CM

## 2023-10-30 DIAGNOSIS — E785 Hyperlipidemia, unspecified: Secondary | ICD-10-CM

## 2023-10-30 DIAGNOSIS — I2510 Atherosclerotic heart disease of native coronary artery without angina pectoris: Secondary | ICD-10-CM | POA: Diagnosis not present

## 2023-10-30 DIAGNOSIS — I351 Nonrheumatic aortic (valve) insufficiency: Secondary | ICD-10-CM

## 2023-10-30 DIAGNOSIS — I7121 Aneurysm of the ascending aorta, without rupture: Secondary | ICD-10-CM

## 2023-10-30 MED ORDER — PANTOPRAZOLE SODIUM 40 MG PO TBEC: 40.0000 mg | DELAYED_RELEASE_TABLET | Freq: Every day | ORAL | 4 refills | Status: DC

## 2023-10-30 MED ORDER — AMLODIPINE BESYLATE 5 MG PO TABS: 5.0000 mg | ORAL_TABLET | Freq: Every day | ORAL | 4 refills | Status: AC

## 2023-10-30 MED ORDER — FUROSEMIDE 40 MG PO TABS: 40.0000 mg | ORAL_TABLET | Freq: Every day | ORAL | 4 refills | Status: DC

## 2023-10-30 MED ORDER — CARVEDILOL 25 MG PO TABS: 25.0000 mg | ORAL_TABLET | Freq: Two times a day (BID) | ORAL | 4 refills | Status: AC

## 2023-10-30 MED ORDER — SPIRONOLACTONE 25 MG PO TABS: 25.0000 mg | ORAL_TABLET | Freq: Every day | ORAL | 4 refills | Status: AC

## 2023-10-30 MED ORDER — ATORVASTATIN CALCIUM 80 MG PO TABS: 80.0000 mg | ORAL_TABLET | Freq: Every day | ORAL | 2 refills | Status: AC

## 2023-10-30 MED ORDER — ISOSORBIDE DINITRATE 20 MG PO TABS: 20.0000 mg | ORAL_TABLET | Freq: Three times a day (TID) | ORAL | 4 refills | Status: AC

## 2023-10-30 NOTE — Progress Notes (Signed)
 Patient ID: Thomas Bullock, male   DOB: 1951-03-13, 73 y.o.   MRN: [id number]      Primary MD: Dr. Seena  HPI: Thomas Bullock is a 73 y.o. male with a history of a nonischemic cardiomyopathy who presents to the office for a 26 month follow-up cardiology evaluation.   Mr. Thomas Bullock a long-standing history of hypertension. In May 2013 after being off his medications for some time he presented to [facility] in the setting of acute pulmonary edema. He was found to have a nonischemic cardiomyopathy with an ejection fraction of 20-25% and minimal CAD with 20% narrowing in the LAD noted at catheterization.With aggressive medical therapy and gradual titration of his medications  LV function has improved. An echo Doppler study in September 2013 showed an ejection fraction of 45% and an echo Doppler study on 01/26/2014 revealed complete normalization of LV function with an ejection fraction of 60-65%.  There was grade 1 diastolic dysfunction.  He had a determine LV filling pressure.  PA pressure was normal at 11 mm.  His  ascending aorta was moderately dilated up to 4.7 cm.   He was hospitalized in April 2017 and was felt to have a TIA.  He presented with vertigo and double vision.  An MRI of the brain did not show definitive evidence of an acute stroke.  There was some evidence of low flow.  The right vertebral artery, which was confirmed by carotid Doppler study.  He underwent a 2-D echo Doppler study on 09/06/2015 which showed an EF of 55-60% with grade 1 diastolic dysfunction.  There was mild-to-moderate aortic insufficiency.  There was no evidence for cardiac source of TIA.  He was subtotally seen in neurology follow-up by Dr. Jenel in June.  Presently he denies episodes of chest pain.  He has continued to do well the he denies any significant shortness of breath but still notes  mild shortness of breath with activity. He denies   palpitations, presyncope  syncope or arrhythmia.  He admits to  occasional episodes of indigestion and has been taking over-the-counter Tums for this with plus minus benefit.  He denies any edema.  He denies PND, orthopnea.  I saw him in January 2018 at which time he was doing well without chest pain shortness of breath or palpitations. At that time I further titrated Spironolactone  to 25 mg twice a day, decrease furosemide  to 20 mg daily and recommended he reduce his aspirin  down to 81 mg.  Over the past several years he has been without chest pain or significant swelling.  He has not had a recent echo.  Most recently he has only been taking the Spironolactone  12.5 mg twice a day and he is continued to be on isosorbide /hydralazine  20/37.5 3 times daily he is continued to be on digoxin  0.0625 mg.   When I saw him in October 2019 his blood pressure was significantly elevated at 170/100.  I further titrated hydralazine  to 50 mg every 8 hours.  I increase spironolactone  to 25 mg twice a day.  6 arrange follow-up visit in over the past reassessments with Josette Plunk, PharmD  And Hao Meng, PAC, his medications have been further titrated.  He is now on amlodipine  5 mg daily, carvedilol  25 mg twice a day, furosemide  20 mg daily, hydralazine  75 mg 3 times a day isosorbide  dinitrate 20 mg 3 times a day in addition to his pantoprazole  and atorvastatin  80 mg and aspirin .  He underwent follow-up echo Doppler study  which showed EF at 50 to 55% in October 2019.  His ascending aorta was dilated at 47 mm.  He subsequently underwent CT angiography of his aorta March 19, 2018 which showed mild dilation of the aortic root measuring 4.1 x 4.2 cm at the level of the sinus of Valsalva.  There was aneurysmal disease of the ascending thoracic aorta measuring 4.6 cm in greatest diameter.  He was also noted of coronary atherosclerosis with mild amount of calcified plaque in the proximal LAD and minimal calcified plaque in the proximal circumflex.  He felt well on his increased medical  regimen.    I saw him in February 2020 and prior to that evaluation he had undergone several dose titrations of his medical management.  His blood pressure was significantly improved on a regimen consisting of amlodipine  5 mg, carvedilol  25 mg twice a day, hydralazine  75 mg 3 times a day, isosorbide  dinitrate 20 mg 3 times a day, furosemide  20 mg daily, and spironolactone  25 mg twice a day.  Since his echo Doppler study showed an EF of 50 to 55%, I recommended discontinuance of digoxin .  He has continued to be on atorvastatin  80 mg for hyperlipidemia.    I saw him in October 2020.  At that time he continued to feel well.  His blood pressure was well controlled and he continued to be on amlodipine  5 mg, carvedilol  25 mg twice a day, furosemide  20 mg daily, hydralazine  75 mg 3 times a day with isosorbide  dinitrate 20 mg 3 times per day (by double dosing) in addition to spironolactone  25 mg daily.  He continued to be on atorvastatin  80 mg with LDL cholesterol at 57.    When I saw him on May 10, 2020 he remained stable and specifically denied any chest pain or shortness of breath.  He did not have any edema.  He denied PND orthopnea or palpitations.  He has not had recent laboratory.  He subsequently underwent laboratory on May 17, 2020 which showed a total cholesterol 137, HDL 39, LDL 79 and triglycerides 100.  Potassium was 4.4.  TSH 4.7.  Hemoglobin 13.2.  I last saw him on August 23, 2021.  He continued to be stable and denied chest pain or shortness of breath.  He walks occasionally.  He is unaware of palpitations, PND orthopnea.  He has not had any laboratory since January 2022.  He has had some erectile function issues.  During that evaluation, he was euvolemic.   Since I last saw him, he was evaluated by Aline Door, PA-C on Sep 25, 2022.  He denied any chest pain, shortness of breath, PND orthopnea, palpitations, dizziness or syncope.  He did experience fatigue.  He was referred for CT angio  of his chest and aorta on October 12, 2022.  He was noted to have 44 mm ascending thoracic aortic aneurysm and mild dilation at 42 mm at the level of the sinus of Valsalva.  Presently, he feels well.  He continues to be on amlodipine  5 mg, carvedilol  25 mg twice a day, furosemide  40 mg daily, hydrochlorothiazide 75 mg 3 times a day, isosorbide  20 mg 3 times a day, in addition to spironolactone  25 mg daily.  He is on pantoprazole  for GERD and takes atorvastatin  80 mg for lipid management.  He had undergone recent colonoscopy at [facility].  He denies any chest pain or significant shortness of breath.  He has not had recent laboratory.  He presents for evaluation.  Past Medical History:  Diagnosis Date   Acute pulmonary edema (HCC) 10/04/2011   Coronary artery disease    GERD (gastroesophageal reflux disease) 10/04/2011   Hypertension    LVH (left ventricular hypertrophy) 10/04/2011   Systolic CHF (HCC) 11/07/2011   Hospitalized for flash pulmonary edema in June 2012 due to malignant HTN.   EF was 20-25% with severe global hypokinesis. left heart cath which revealed an LAD with 20% smooth narrowing but otherwise normal coronaries. On ace inhibitor, spironalactone, carvedilol . Diuresed in hospital.  Life Vest was ordered and fitted by [facility] to wear until September.       Past Surgical History:  Procedure Laterality Date   CHOLECYSTECTOMY     CHOLECYSTECTOMY, LAPAROSCOPIC  2006   DENTAL SURGERY     LEFT HEART CATHETERIZATION WITH CORONARY ANGIOGRAM N/A 10/05/2011   Procedure: LEFT HEART CATHETERIZATION WITH CORONARY ANGIOGRAM;  Surgeon: Debby DELENA Sor, MD;  Location: [facility] CATH LAB;  Service: Cardiovascular;  Laterality: N/A;   RIGHT HEART CATHETERIZATION  10/05/2011   Procedure: RIGHT HEART CATH;  Surgeon: Debby DELENA Sor, MD;  Location: [facility] CATH LAB;  Service: Cardiovascular;;    No Known Allergies  Current Outpatient Medications  Medication Sig Dispense Refill   aspirin  EC 81 MG tablet Take 1 tablet (81 mg  total) by mouth daily. 90 tablet 3   hydrALAZINE  (APRESOLINE ) 50 MG tablet TAKE 1 & 1/2 (ONE & ONE-HALF) TABLETS BY MOUTH THREE TIMES DAILY 315 tablet 0   latanoprost (XALATAN) 0.005 % ophthalmic solution SMARTSIG:In Eye(s)     TRAVATAN Z 0.004 % SOLN ophthalmic solution      amLODipine  (NORVASC ) 5 MG tablet Take 1 tablet (5 mg total) by mouth daily. 90 tablet 4   atorvastatin  (LIPITOR) 80 MG tablet Take 1 tablet (80 mg total) by mouth daily. 90 tablet 2   carvedilol  (COREG ) 25 MG tablet Take 1 tablet (25 mg total) by mouth 2 (two) times daily with a meal. 180 tablet 4   furosemide  (LASIX ) 40 MG tablet Take 1 tablet (40 mg total) by mouth daily. 90 tablet 4   isosorbide  dinitrate (ISORDIL ) 20 MG tablet Take 1 tablet (20 mg total) by mouth 3 (three) times daily. 270 tablet 4   pantoprazole  (PROTONIX ) 40 MG tablet Take 1 tablet (40 mg total) by mouth daily. 90 tablet 4   spironolactone  (ALDACTONE ) 25 MG tablet Take 1 tablet (25 mg total) by mouth daily. 90 tablet 4   No current facility-administered medications for this visit.    Socially he is single. He is retired. He works in [employer]. Completed 12th grade education. There is no tobacco history. He does drink occasional alcohol .  ROS General: Negative; No fevers, chills, or night sweats;  HEENT: Negative; No changes in vision or hearing, sinus congestion, difficulty swallowing Pulmonary: Negative; No cough, wheezing, shortness of breath, hemoptysis Cardiovascular: See history of present illness; No chest pain, presyncope, syncope, palpitations GI: Negative; No nausea, vomiting, diarrhea, or abdominal pain GU: Mild erectile dysfunction issues, would like to no dysuria, hematuria, or difficulty voiding Musculoskeletal: Negative; no myalgias, joint pain, or weakness Hematologic/Oncology: Negative; no easy bruising, bleeding Endocrine: Negative; no heat/cold intolerance; no diabetes Neuro: Negative; no changes in balance,  headaches Skin: Negative; No rashes or skin lesions Psychiatric: Negative; No behavioral problems, depression Sleep: Negative; No snoring, daytime sleepiness, hypersomnolence, bruxism, restless legs, hypnogognic hallucinations, no cataplexy Other comprehensive 14 point system review is negative.   PE BP 116/74   Pulse 66  Ht 5' 6 (1.676 m)   Wt 193 lb (87.5 kg)   SpO2 97%   BMI 31.15 kg/m    Repeat blood pressure by me was 128/78  Wt Readings from Last 3 Encounters:  10/30/23 193 lb (87.5 kg)  09/25/22 192 lb 6.4 oz (87.3 kg)  08/23/21 192 lb 9.6 oz (87.4 kg)   General: Alert, oriented, no distress.  Skin: normal turgor, no rashes, warm and dry HEENT: Normocephalic, atraumatic. Pupils equal round and reactive to light; sclera anicteric; extraocular muscles intact;  Nose without nasal septal hypertrophy Mouth/Parynx benign; Mallinpatti scale3 Neck: No JVD, no carotid bruits; normal carotid upstroke Lungs: clear to ausculatation and percussion; no wheezing or rales Chest wall: without tenderness to palpitation Heart: PMI not displaced, RRR, s1 s2 normal, 1/6 systolic murmur, no diastolic murmur, no rubs, gallops, thrills, or heaves Abdomen: soft, nontender; no hepatosplenomehaly, BS+; abdominal aorta nontender and not dilated by palpation. Back: no CVA tenderness Pulses 2+ Musculoskeletal: full range of motion, normal strength, no joint deformities Extremities: no clubbing cyanosis or edema, Homan's sign negative  Neurologic: grossly nonfocal; Cranial nerves grossly wnl Psychologic: Normal mood and affect   EKG Interpretation Date/Time:  Wednesday October 30 2023 10:16:40 EDT Ventricular Rate:  66 PR Interval:  208 QRS Duration:  90 QT Interval:  400 QTC Calculation: 419 R Axis:   -46  Text Interpretation: Normal sinus rhythm Left axis deviation When compared with ECG of 02-Sep-2015 12:02, Premature ventricular complexes are no longer Present QRS axis Shifted left  Confirmed by Burnard Ned ([id number]) on 10/30/2023 11:22:04 AM    August 23, 2021 ECG (independently read by me): Sinus bradycardia 57 bpm, first-degree AV block with a parable 220 ms.  No ectopy.    May 10, 2020 ECG (independently read by me): Sinus bradycardia at 58 bpm.  First-degree AV block with a PR interval of 224 ms.  T wave abnormality in lead III and aVF.  Poor R wave progression V1 through V3.  QTc interval 392 ms.  October 2020 ECG (independently read by me): Sinus Bradycardia at 56, First degree AV block; PR 224 msec  February 2020 ECG (independently read by me): Sinus Bradycardia 57, First degree AV Block; PR 226 msec  October 2019 ECG (independently read by me): Sinus bradycardia 55 bpm.  First-degree AV block with a PR interval 218 ms.  QS complex V1 V2.  January 2018 ECG (independently read by me): Normal sinus rhythm at 60 bpm.  Milliseconds.  No ST segment changes.  July 2017 ECG (independently read by me): Normal sinus rhythm with first-degree AV block.  Mild T wave abnormality in lead 3  October ECG (independently read by me): normal sinus rhythm at 65 bpm..  Probable left posterior hemiblock.  Probably progression V1 through V4.  May 2016 ECG (independently read by me): Normal sinus rhythm at 75 bpm.  First-degree AV block with a PR interval 206 ms.  T-wave inversion in leads 3 and mildly in aVF.  September 2015 ECG (independently read by me): Normal sinus rhythm with one isolated PVC.  QTc interval 397 ms.  PR interval 190 ms.  January 2015 ECG (independently read by me): Normal sinus rhythm at 60 beats per minute. Normal intervals.  Previous ECG from 12/04/2012 : Sinus bradycardia 53 beats per minute period. PR interval 196; QTc interval 377 ms  LABS:    Latest Ref Rng & Units 10/30/2023   12:08 PM 09/27/2022    9:05 AM 08/25/2021   11:36  AM  BMP  Glucose 70 - 99 mg/dL 79  89  75   BUN 8 - 27 mg/dL 16  14  9    Creatinine 0.76 - 1.27 mg/dL 8.86  8.70  8.93    BUN/Creat Ratio 10 - 24 14  11  8    Sodium 134 - 144 mmol/L 139  139  136   Potassium 3.5 - 5.2 mmol/L 4.6  4.3  4.4   Chloride 96 - 106 mmol/L 102  104  101   CO2 20 - 29 mmol/L 21  23  22    Calcium  8.6 - 10.2 mg/dL 9.8  9.7  89.9       Latest Ref Rng & Units 10/30/2023   12:08 PM 09/27/2022    9:05 AM 08/25/2021   11:36 AM  Hepatic Function  Total Protein 6.0 - 8.5 g/dL 8.0  7.4  8.0   Albumin 3.8 - 4.8 g/dL 4.5  4.1  4.5   AST 0 - 40 IU/L 33  34  29   ALT 0 - 44 IU/L 22  27  18    Alk Phosphatase 44 - 121 IU/L 105  94  111   Total Bilirubin 0.0 - 1.2 mg/dL 0.6  0.5  0.4       Latest Ref Rng & Units 10/30/2023   12:08 PM 08/25/2021   11:36 AM 05/17/2020   11:19 AM  CBC  WBC 3.4 - 10.8 x10E3/uL 7.4  6.7  9.7   Hemoglobin 13.0 - 17.7 g/dL 86.3  86.4  86.7   Hematocrit 37.5 - 51.0 % 42.5  38.6  39.8   Platelets 150 - 450 x10E3/uL 238  213  205    Lab Results  Component Value Date   TSH 3.880 10/30/2023   Lipid Panel     Component Value Date/Time   CHOL 134 10/30/2023 1208   TRIG 94 10/30/2023 1208   HDL 39 (L) 10/30/2023 1208   CHOLHDL 3.4 10/30/2023 1208   CHOLHDL 3.9 09/05/2016 1335   VLDL 30 09/05/2016 1335   LDLCALC 77 10/30/2023 1208   BNP    Component Value Date/Time   PROBNP 1,662.0 (H) 10/06/2011 9374     RADIOLOGY: No results found.  IMPRESSION:  1. Primary hypertension   2. Non-ischemic cardiomyopathy (HCC): Resolved   3. Chronic HFrEF (heart failure with reduced ejection fraction) (HCC)   4. Coronary artery disease involving native coronary artery of native heart without angina pectoris   5. Aneurysm of ascending aorta without rupture (HCC)   6. Aortic valve insufficiency, etiology of cardiac valve disease unspecified   7. Hyperlipidemia, unspecified hyperlipidemia type     ASSESSMENT AND PLAN: Mr. Dhondt is a 73 year-old African-American male who has a long-standing history of hypertension and a history of a nonischemic cardiomyopathy who  presented in pulmonary edema after he had stopped his blood pressure medications for several months in May 2013. Since that time on medical therapy his ejection fraction improved from 20% to 60-65% as noted on his echo of 01/26/2014.  He has moderate dilatation of his ascending aorta.  Pulmonary pressures were normal.  When I saw him in October 2019 he had stage II hypertension since that time he is undergone several dose titrations of his medical management.  Presently, his blood pressure is stable on his current regimen of amlodipine  5 mg, carvedilol  25 mg twice a day, furosemide  40 mg daily, spironolactone  25 mg twice a day, and he also was on a BiDil  regimen with hydralazine  75 mg 3 times a day and isosorbide  dinitrate 20 mg 3 times a day.  He does not have any anginal symptoms.  He has evidence for mild ascending thoracic aortic aneurysm which measured 44 mm on October 12, 2022 with mild dilation at the level of the sinus of Valsalva at 42 mm.  He has not had recent laboratory.  I have recommended he undergo comprehensive metabolic panel, CBC, TSH, fasting lipid panel and LP(a).  In December 2025, I have recommended he undergo a 24-month follow-up evaluation with CT angio of his chest.  He is aware of my imminent retirement.  I will transition him to the care of Dr. Lonni Nanas to be seen in January 2026 or sooner as needed.   Debby RONAL Sor, MD, FACC  11/03/2023 2:02 PM

## 2023-10-30 NOTE — Patient Instructions (Signed)
 Medication Instructions:  Your physician recommends that you continue on your current medications as directed. Please refer to the Current Medication list given to you today.  *If you need a refill on your cardiac medications before your next appointment, please call your pharmacy*  Lab Work: Your physician recommends that you have labs drawn today: CMET, CBC, TSH, Lipids, LP(a)  If you have labs (blood work) drawn today and your tests are completely normal, you will receive your results only by: MyChart Message (if you have MyChart) OR A paper copy in the mail If you have any lab test that is abnormal or we need to change your treatment, we will call you to review the results.  Testing/Procedures: Non-Cardiac CT Angiography (CTA) chest/aorta, is a special type of CT scan that uses a computer to produce multi-dimensional views of major blood vessels throughout the body. In CT angiography, a contrast material is injected through an IV to help visualize the blood vessels **To do in December**  To schedule call [phone]  Follow-Up: At [facility], you and your health needs are our priority.  As part of our continuing mission to provide you with exceptional heart care, our providers are all part of one team.  This team includes your primary Cardiologist (physician) and Advanced Practice Providers or APPs (Physician Assistants and Nurse Practitioners) who all work together to provide you with the care you need, when you need it.  Your next appointment:   6-7 month(s)  Provider:   Dr. Lonni Nanas    We recommend signing up for the patient portal called MyChart.  Sign up information is provided on this After Visit Summary.  MyChart is used to connect with patients for Virtual Visits (Telemedicine).  Patients are able to view lab/test results, encounter notes, upcoming appointments, etc.  Non-urgent messages can be sent to your provider as well.   To learn more about  what you can do with MyChart, go to [email or web address].

## 2023-10-31 ENCOUNTER — Ambulatory Visit: Payer: Self-pay | Admitting: *Deleted

## 2023-10-31 LAB — LIPOPROTEIN A (LPA)

## 2023-11-01 LAB — COMPREHENSIVE METABOLIC PANEL WITH GFR
ALT: 22 IU/L (ref 0–44)
AST: 33 IU/L (ref 0–40)
Albumin: 4.5 g/dL (ref 3.8–4.8)
Alkaline Phosphatase: 105 IU/L (ref 44–121)
BUN/Creatinine Ratio: 14 (ref 10–24)
BUN: 16 mg/dL (ref 8–27)
Bilirubin Total: 0.6 mg/dL (ref 0.0–1.2)
CO2: 21 mmol/L (ref 20–29)
Calcium: 9.8 mg/dL (ref 8.6–10.2)
Chloride: 102 mmol/L (ref 96–106)
Creatinine, Ser: 1.13 mg/dL (ref 0.76–1.27)
Globulin, Total: 3.5 g/dL (ref 1.5–4.5)
Glucose: 79 mg/dL (ref 70–99)
Potassium: 4.6 mmol/L (ref 3.5–5.2)
Sodium: 139 mmol/L (ref 134–144)
Total Protein: 8 g/dL (ref 6.0–8.5)
eGFR: 69 mL/min/{1.73_m2} (ref >59)

## 2023-11-01 LAB — CBC
Hematocrit: 42.5 % (ref 37.5–51.0)
Hemoglobin: 13.6 g/dL (ref 13.0–17.7)
MCH: 26.1 pg — ABNORMAL LOW (ref 26.6–33.0)
MCHC: 32 g/dL (ref 31.5–35.7)
MCV: 81 fL (ref 79–97)
Platelets: 238 10*3/uL (ref 150–450)
RBC: 5.22 x10E6/uL (ref 4.14–5.80)
RDW: 15.2 % (ref 11.6–15.4)
WBC: 7.4 10*3/uL (ref 3.4–10.8)

## 2023-11-01 LAB — LIPID PANEL
Chol/HDL Ratio: 3.4 ratio (ref 0.0–5.0)
Cholesterol, Total: 134 mg/dL (ref 100–199)
HDL: 39 mg/dL — ABNORMAL LOW (ref >39)
LDL Chol Calc (NIH): 77 mg/dL (ref 0–99)
Triglycerides: 94 mg/dL (ref 0–149)
VLDL Cholesterol Cal: 18 mg/dL (ref 5–40)

## 2023-11-01 LAB — TSH: TSH: 3.88 u[IU]/mL (ref 0.450–4.500)

## 2023-11-03 ENCOUNTER — Encounter: Payer: Self-pay | Admitting: Cardiovascular Disease

## 2023-11-04 ENCOUNTER — Other Ambulatory Visit: Payer: Self-pay

## 2023-11-04 DIAGNOSIS — E785 Hyperlipidemia, unspecified: Secondary | ICD-10-CM

## 2023-11-04 MED ORDER — EZETIMIBE 10 MG PO TABS: 10.0000 mg | ORAL_TABLET | Freq: Every day | ORAL | 3 refills | Status: DC

## 2023-12-03 ENCOUNTER — Telehealth: Payer: Self-pay | Admitting: Family Medicine

## 2023-12-03 NOTE — Telephone Encounter (Signed)
 Called patient verified name and date of birth. Patient has been informed that our office is not accepting new patients. Patient last saw Dr. Newlin in 2018. I advised patient to contact the [facility], provided the address and the phone number. Patient acknowledged.

## 2023-12-03 NOTE — Telephone Encounter (Signed)
 Copied from CRM #[id number]. Topic: Appointments - Appointment Scheduling >> Dec 03, 2023 11:07 AM Delon DASEN wrote:  Patient needs to be seen by Dr Delbert- Lonell Pa with [facility] calling about labs - hemaglobin 7.0- he has not been in the office in a long time but needs to see Dr. Newlin- give patient a call [phone] or [phone]

## 2024-02-03 ENCOUNTER — Telehealth: Payer: Self-pay | Admitting: Cardiology

## 2024-02-03 MED ORDER — EZETIMIBE 10 MG PO TABS: 10.0000 mg | ORAL_TABLET | Freq: Every day | ORAL | 0 refills | Status: DC

## 2024-02-03 NOTE — Telephone Encounter (Signed)
 Pt's medication was sent to pt's pharmacy as requested. Confirmation received.

## 2024-02-03 NOTE — Telephone Encounter (Signed)
*  STAT* If patient is at the pharmacy, call can be transferred to refill team.   1. Which medications need to be refilled? (please list name of each medication and dose if known)   ezetimibe  (ZETIA ) 10 MG tablet   He is going to get labs drawn tomorrow morning for 3 month lipid - states he has 4 tabs left     4. Which pharmacy/location (including street and city if local pharmacy) is medication to be sent to?  [facility] - [place] ([place]), [place] - [address]     5. Do they need a 30 day or 90 day supply? 90

## 2024-02-04 LAB — LIPID PANEL

## 2024-02-05 ENCOUNTER — Ambulatory Visit: Payer: Self-pay | Admitting: Cardiology

## 2024-02-05 LAB — HEPATIC FUNCTION PANEL
ALT: 21 IU/L (ref 0–44)
AST: 27 IU/L (ref 0–40)
Albumin: 4.2 g/dL (ref 3.8–4.8)
Alkaline Phosphatase: 100 IU/L (ref 47–123)
Bilirubin Total: 0.5 mg/dL (ref 0.0–1.2)
Bilirubin, Direct: 0.17 mg/dL (ref 0.00–0.40)
Total Protein: 7.9 g/dL (ref 6.0–8.5)

## 2024-02-05 LAB — LIPID PANEL
Cholesterol, Total: 107 mg/dL (ref 100–199)
HDL: 35 mg/dL — ABNORMAL LOW (ref >39)
LDL CALC COMMENT:: 3.1 ratio (ref 0.0–5.0)
LDL Chol Calc (NIH): 59 mg/dL (ref 0–99)
Triglycerides: 59 mg/dL (ref 0–149)
VLDL Cholesterol Cal: 13 mg/dL (ref 5–40)

## 2024-04-08 ENCOUNTER — Other Ambulatory Visit: Payer: Self-pay

## 2024-04-09 ENCOUNTER — Other Ambulatory Visit: Payer: Self-pay | Admitting: Cardiology

## 2024-04-09 NOTE — Progress Notes (Unsigned)
 Cardiology Office Note:    Date:  04/12/2024   ID:  Thomas Bullock, DOB 1950/07/02, MRN [id number]  PCP:  Delbert Clam, MD  Cardiologist:  None  Electrophysiologist:  None   Referring MD: Delbert Clam, MD   Chief Complaint  Patient presents with   Hypertension    History of Present Illness:    Thomas Bullock is a 73 y.o. male with a hx of heart failure with recovered EF, nonobstructive CAD, CVA, dilated aorta who presents for follow-up.  Previously followed with Dr. Burnard.  Has history of systolic heart failure with EF as low as 20 to 25% in 2013.  Cardiac catheterization at that time showed minimal CAD.  Cardiomyopathy was thought to be due to uncontrolled hypertension.  He was started on GDMT and EF improved to 45% 01/2012.  During admission with acute CVA 08/2015 echo showed EF 55 to 60%.  Echo in 02/2018 showed EF 50 to 55%, severe LVH, dilated ascending aorta measuring 47 mm.  Since last clinic visit, he reports he is doing well.  Does report some chest pain that occurs after eating.  He walks 2 to 3 days/week for 30 minutes, denies any exertional chest pain.  Reports some lightheadedness but denies any syncope. Denies any dyspnea, lower extremity edema, or palpitations.    Past Medical History:  Diagnosis Date   Acute pulmonary edema (HCC) 10/04/2011   Coronary artery disease    GERD (gastroesophageal reflux disease) 10/04/2011   Hypertension    LVH (left ventricular hypertrophy) 10/04/2011   Systolic CHF (HCC) 11/07/2011   Hospitalized for flash pulmonary edema in June 2012 due to malignant HTN.   EF was 20-25% with severe global hypokinesis. left heart cath which revealed an LAD with 20% smooth narrowing but otherwise normal coronaries. On ace inhibitor, spironalactone, carvedilol . Diuresed in hospital.  Life Vest was ordered and fitted by [employer] to wear until September.       Past Surgical History:  Procedure Laterality Date   CHOLECYSTECTOMY     CHOLECYSTECTOMY,  LAPAROSCOPIC  2006   DENTAL SURGERY     LEFT HEART CATHETERIZATION WITH CORONARY ANGIOGRAM N/A 10/05/2011   Procedure: LEFT HEART CATHETERIZATION WITH CORONARY ANGIOGRAM;  Surgeon: Debby DELENA Burnard, MD;  Location: [facility] CATH LAB;  Service: Cardiovascular;  Laterality: N/A;   RIGHT HEART CATHETERIZATION  10/05/2011   Procedure: RIGHT HEART CATH;  Surgeon: Debby DELENA Burnard, MD;  Location: [facility] CATH LAB;  Service: Cardiovascular;;    Current Medications: Current Meds  Medication Sig   amLODipine  (NORVASC ) 5 MG tablet Take 1 tablet (5 mg total) by mouth daily.   aspirin  EC 81 MG tablet Take 1 tablet (81 mg total) by mouth daily.   atorvastatin  (LIPITOR) 80 MG tablet Take 1 tablet (80 mg total) by mouth daily.   [EXPIRED] Blood Pressure Monitoring (OMRON 3 SERIES BP MONITOR) DEVI Monitor blood pressure as directed.   carvedilol  (COREG ) 25 MG tablet Take 1 tablet (25 mg total) by mouth 2 (two) times daily with a meal.   furosemide  (LASIX ) 40 MG tablet Take 1 tablet (40 mg total) by mouth daily.   hydrALAZINE  (APRESOLINE ) 50 MG tablet Take 1 tablet (50 mg total) by mouth 3 (three) times daily.   isosorbide  dinitrate (ISORDIL ) 20 MG tablet Take 1 tablet (20 mg total) by mouth 3 (three) times daily.   latanoprost (XALATAN) 0.005 % ophthalmic solution SMARTSIG:In Eye(s)   spironolactone  (ALDACTONE ) 25 MG tablet Take 1 tablet (25 mg total) by mouth  daily.   TRAVATAN Z 0.004 % SOLN ophthalmic solution    [DISCONTINUED] ezetimibe  (ZETIA ) 10 MG tablet Take 1 tablet (10 mg total) by mouth daily.   [DISCONTINUED] hydrALAZINE  (APRESOLINE ) 50 MG tablet TAKE 1 & 1/2 (ONE & ONE-HALF) TABLETS BY MOUTH THREE TIMES DAILY   [DISCONTINUED] pantoprazole  (PROTONIX ) 40 MG tablet Take 1 tablet (40 mg total) by mouth daily.     Allergies:   Patient has no known allergies.   Social History   Socioeconomic History   Marital status: Single    Spouse name: Not on file   Number of children: 0   Years of education: Not on file    Highest education level: Not on file  Occupational History   Not on file  Tobacco Use   Smoking status: Former    Current packs/day: 0.00    Types: Cigarettes    Quit date: 05/07/1973    Years since quitting: 50.9   Smokeless tobacco: Never   Tobacco comments:    on 11/07/11 patient states never smoker.   Substance and Sexual Activity   Alcohol  use: Yes    Alcohol /week: 2.0 standard drinks of alcohol     Types: 2 Cans of beer per week    Comment: weekends   Drug use: No   Sexual activity: Yes  Other Topics Concern   Not on file  Social History Narrative   Patient with finanacial difficulties. Unable to work at [employer] because of him having to wear lifevest. Will have job opportunity once off in September. Attempting [employer].       High school graduate but expresses has some difficulty reading at times if complicated.    Working at [employer] previously.       Cell phone [phone] not ok to leave message   Home phone [phone] ok to leave message.       Lives with girlfriend. No kids. Friend helps with transport.    Social Drivers of Corporate Investment Banker Strain: Not on file  Food Insecurity: Not on file  Transportation Needs: Not on file  Physical Activity: Not on file  Stress: Not on file  Social Connections: Not on file     Family History: The patient's family history includes Diabetes type II in his mother; Heart attack in his father; Heart disease in his brother and sister; Kidney disease in his mother; Stroke in his mother.  ROS:   Please see the history of present illness.     All other systems reviewed and are negative.  EKGs/Labs/Other Studies Reviewed:    The following studies were reviewed today:   EKG:  EKG is not ordered today.    Recent Labs: 10/30/2023: Hemoglobin 13.6; Platelets 238; TSH 3.880 02/04/2024: ALT 21 04/10/2024: BUN 16; Creatinine, Ser 1.52; Magnesium 1.8; Potassium 4.4; Sodium 137  Recent Lipid Panel     Component Value Date/Time   CHOL 107 02/04/2024 0953   TRIG 59 02/04/2024 0953   HDL 35 (L) 02/04/2024 0953   CHOLHDL 3.1 02/04/2024 0953   CHOLHDL 3.9 09/05/2016 1335   VLDL 30 09/05/2016 1335   LDLCALC 59 02/04/2024 0953    Physical Exam:    VS:  BP 124/78   Pulse 75   Ht 5' 6 (1.676 m)   Wt 199 lb (90.3 kg)   SpO2 97%   BMI 32.12 kg/m     Wt Readings from Last 3 Encounters:  04/10/24 199 lb (90.3 kg)  10/30/23 193  lb (87.5 kg)  09/25/22 192 lb 6.4 oz (87.3 kg)     GEN:  Well nourished, well developed in no acute distress HEENT: Normal NECK: No JVD; No carotid bruits LYMPHATICS: No lymphadenopathy CARDIAC: RRR, no murmurs, rubs, gallops RESPIRATORY:  Clear to auscultation without rales, wheezing or rhonchi  ABDOMEN: Soft, non-tender, non-distended MUSCULOSKELETAL:  No edema; No deformity  SKIN: Warm and dry NEUROLOGIC:  Alert and oriented x 3 PSYCHIATRIC:  Normal affect   ASSESSMENT:    1. Heart failure with recovered ejection fraction (HFrecEF) (HCC)   2. Aortic dilatation   3. Coronary artery disease involving native coronary artery of native heart without angina pectoris   4. Primary hypertension   5. Hyperlipidemia, unspecified hyperlipidemia type   6. Medication management    PLAN:    Heart failure with recovered EF: Has history of systolic heart failure with EF as low as 20 to 25% in 2013.  Cardiac catheterization at that time showed minimal CAD.  Cardiomyopathy was thought to be due to uncontrolled hypertension.  He was started on GDMT and EF improved to 45% 01/2012.  During admission with acute CVA 08/2015 echo showed EF 55 to 60%.  Echo in 02/2018 showed EF 50 to 55%, severe LVH, dilated ascending aorta measuring 47 mm. - Continue carvedilol  25 mg twice daily, hydralazine  50 mg 3 times daily, Isordil  20 mg 3 times daily, spironolactone  25 mg daily.  Previously on benazepril but was stopped due to renal function - Continue Lasix  40 mg daily - Update  echocardiogram  Dilated aorta: Measured 44 mm on CTA 10/2022.  Follow-up with repeat echocardiogram  CAD: Minimal nonobstructive CAD on cath in 2013.  Continue statin  Hypertension: On amlodipine  5 mg daily, carvedilol  25 mg twice daily, Lasix  40 mg daily, hydralazine  75 mg 3 times daily, Isordil  20 mg 3 times daily, spironolactone  25 mg daily - Has been off hydralazine  for few days, BP normal, will restart but at a lower dose (50 mg 3 times daily).  Asked to check BP daily for next 2 weeks and let us  know results  Hyperlipidemia: On atorvastatin  80 mg daily and Zetia  10 mg daily.  LDL 59  on 02/04/24  RTC in 6 months   Medication Adjustments/Labs and Tests Ordered: Current medicines are reviewed at length with the patient today.  Concerns regarding medicines are outlined above.  Orders Placed This Encounter  Procedures   Basic Metabolic Panel (BMET)   Magnesium   ECHOCARDIOGRAM COMPLETE   Meds ordered this encounter  Medications   Blood Pressure Monitoring (OMRON 3 SERIES BP MONITOR) DEVI    Sig: Monitor blood pressure as directed.    Dispense:  1 each    Refill:  0   hydrALAZINE  (APRESOLINE ) 50 MG tablet    Sig: Take 1 tablet (50 mg total) by mouth 3 (three) times daily.    Dispense:  270 tablet    Refill:  2   pantoprazole  (PROTONIX ) 40 MG tablet    Sig: Take 1 tablet (40 mg total) by mouth daily.    Dispense:  90 tablet    Refill:  2   ezetimibe  (ZETIA ) 10 MG tablet    Sig: Take 1 tablet (10 mg total) by mouth daily.    Dispense:  90 tablet    Refill:  2    Pt must keep upcoming appt in December 2025 with new Cardiologist Dr. Kate before anymore refills. Thank you Final Attempt    Patient Instructions  Medication  Instructions:  Your physician has recommended you make the following change in your medication:  DECREASE Hydralazine  to 50 mg three times daily  *If you need a refill on your cardiac medications before your next appointment, please call your  pharmacy*  Lab Work: Today:  BMET & Magnesium level  If you have any lab test that is abnormal or we need to change your treatment, we will call you to review the results.  Testing/Procedures: Your physician has requested that you have an echocardiogram. Echocardiography is a painless test that uses sound waves to create images of your heart. It provides your doctor with information about the size and shape of your heart and how well your heart's chambers and valves are working. This procedure takes approximately one hour. There are no restrictions for this procedure. Please do NOT wear cologne, perfume, aftershave, or lotions (deodorant is allowed). Please arrive 15 minutes prior to your appointment time.  Please note: We ask at that you not bring children with you during ultrasound (echo/ vascular) testing. Due to room size and safety concerns, children are not allowed in the ultrasound rooms during exams. Our front office staff cannot provide observation of children in our lobby area while testing is being conducted. An adult accompanying a patient to their appointment will only be allowed in the ultrasound room at the discretion of the ultrasound technician under special circumstances. We apologize for any inconvenience.  Follow-Up: At [facility], you and your health needs are our priority.  As part of our continuing mission to provide you with exceptional heart care, our providers are all part of one team.  This team includes your primary Cardiologist (physician) and Advanced Practice Providers or APPs (Physician Assistants and Nurse Practitioners) who all work together to provide you with the care you need, when you need it.  Your next appointment:   6 month(s)  Provider:   Dr. Kate   We recommend signing up for the patient portal called MyChart.  Sign up information is provided on this After Visit Summary.  MyChart is used to connect with patients for Virtual Visits  (Telemedicine).  Patients are able to view lab/test results, encounter notes, upcoming appointments, etc.  Non-urgent messages can be sent to your provider as well.   To learn more about what you can do with MyChart, go to [email or web address].   Thank you for choosing [facility]!!   [phone]   Other Instructions   Check your blood pressure daily for the next week and write them down, then call the office and report the numbers.       Signed, Lonni LITTIE Kate, MD  04/12/2024 2:50 PM    [facility]

## 2024-04-10 ENCOUNTER — Other Ambulatory Visit (HOSPITAL_COMMUNITY): Payer: Self-pay

## 2024-04-10 ENCOUNTER — Ambulatory Visit: Attending: Cardiology | Admitting: Cardiology

## 2024-04-10 ENCOUNTER — Encounter: Payer: Self-pay | Admitting: Cardiology

## 2024-04-10 VITALS — BP 124/78 | HR 75 | Ht 66.0 in | Wt 199.0 lb

## 2024-04-10 DIAGNOSIS — I10 Essential (primary) hypertension: Secondary | ICD-10-CM

## 2024-04-10 DIAGNOSIS — I77819 Aortic ectasia, unspecified site: Secondary | ICD-10-CM | POA: Diagnosis not present

## 2024-04-10 DIAGNOSIS — I2510 Atherosclerotic heart disease of native coronary artery without angina pectoris: Secondary | ICD-10-CM | POA: Diagnosis not present

## 2024-04-10 DIAGNOSIS — I5020 Unspecified systolic (congestive) heart failure: Secondary | ICD-10-CM | POA: Diagnosis not present

## 2024-04-10 DIAGNOSIS — E785 Hyperlipidemia, unspecified: Secondary | ICD-10-CM

## 2024-04-10 DIAGNOSIS — Z79899 Other long term (current) drug therapy: Secondary | ICD-10-CM

## 2024-04-10 MED ORDER — EZETIMIBE 10 MG PO TABS: 10.0000 mg | ORAL_TABLET | Freq: Every day | ORAL | 2 refills | Status: AC

## 2024-04-10 MED ORDER — PANTOPRAZOLE SODIUM 40 MG PO TBEC: 40.0000 mg | DELAYED_RELEASE_TABLET | Freq: Every day | ORAL | 2 refills | Status: AC

## 2024-04-10 MED ORDER — OMRON 3 SERIES BP MONITOR DEVI
1.0000 [IU] | Freq: Once | 0 refills | Status: AC
  Filled 2024-04-10: qty 1, 30d supply, fill #0

## 2024-04-10 MED ORDER — HYDRALAZINE HCL 50 MG PO TABS: 50.0000 mg | ORAL_TABLET | Freq: Three times a day (TID) | ORAL | 2 refills | Status: AC

## 2024-04-10 NOTE — Patient Instructions (Addendum)
 Medication Instructions:  Your physician has recommended you make the following change in your medication:  DECREASE Hydralazine  to 50 mg three times daily  *If you need a refill on your cardiac medications before your next appointment, please call your pharmacy*  Lab Work: Today:  BMET & Magnesium level  If you have any lab test that is abnormal or we need to change your treatment, we will call you to review the results.  Testing/Procedures: Your physician has requested that you have an echocardiogram. Echocardiography is a painless test that uses sound waves to create images of your heart. It provides your doctor with information about the size and shape of your heart and how well your heart's chambers and valves are working. This procedure takes approximately one hour. There are no restrictions for this procedure. Please do NOT wear cologne, perfume, aftershave, or lotions (deodorant is allowed). Please arrive 15 minutes prior to your appointment time.  Please note: We ask at that you not bring children with you during ultrasound (echo/ vascular) testing. Due to room size and safety concerns, children are not allowed in the ultrasound rooms during exams. Our front office staff cannot provide observation of children in our lobby area while testing is being conducted. An adult accompanying a patient to their appointment will only be allowed in the ultrasound room at the discretion of the ultrasound technician under special circumstances. We apologize for any inconvenience.  Follow-Up: At [facility], you and your health needs are our priority.  As part of our continuing mission to provide you with exceptional heart care, our providers are all part of one team.  This team includes your primary Cardiologist (physician) and Advanced Practice Providers or APPs (Physician Assistants and Nurse Practitioners) who all work together to provide you with the care you need, when you need  it.  Your next appointment:   6 month(s)  Provider:   Dr. Kate   We recommend signing up for the patient portal called MyChart.  Sign up information is provided on this After Visit Summary.  MyChart is used to connect with patients for Virtual Visits (Telemedicine).  Patients are able to view lab/test results, encounter notes, upcoming appointments, etc.  Non-urgent messages can be sent to your provider as well.   To learn more about what you can do with MyChart, go to [email or web address].   Thank you for choosing [facility]!!   [phone]   Other Instructions   Check your blood pressure daily for the next week and write them down, then call the office and report the numbers.

## 2024-04-11 LAB — BASIC METABOLIC PANEL WITH GFR
BUN/Creatinine Ratio: 11 (ref 10–24)
BUN: 16 mg/dL (ref 8–27)
CO2: 21 mmol/L (ref 20–29)
Calcium: 9.9 mg/dL (ref 8.6–10.2)
Chloride: 102 mmol/L (ref 96–106)
Creatinine, Ser: 1.52 mg/dL — ABNORMAL HIGH (ref 0.76–1.27)
Glucose: 95 mg/dL (ref 70–99)
Potassium: 4.4 mmol/L (ref 3.5–5.2)
Sodium: 137 mmol/L (ref 134–144)
eGFR: 48 mL/min/1.73 — ABNORMAL LOW (ref >59)

## 2024-04-11 LAB — MAGNESIUM: Magnesium: 1.8 mg/dL (ref 1.6–2.3)

## 2024-04-12 ENCOUNTER — Ambulatory Visit: Payer: Self-pay | Admitting: Cardiology

## 2024-04-12 DIAGNOSIS — Z79899 Other long term (current) drug therapy: Secondary | ICD-10-CM

## 2024-04-12 DIAGNOSIS — I2510 Atherosclerotic heart disease of native coronary artery without angina pectoris: Secondary | ICD-10-CM

## 2024-04-12 DIAGNOSIS — I5020 Unspecified systolic (congestive) heart failure: Secondary | ICD-10-CM

## 2024-04-12 DIAGNOSIS — I77819 Aortic ectasia, unspecified site: Secondary | ICD-10-CM

## 2024-04-14 MED ORDER — FUROSEMIDE 40 MG PO TABS: 40.0000 mg | ORAL_TABLET | Freq: Every day | ORAL | Status: AC | PRN

## 2024-05-04 ENCOUNTER — Ambulatory Visit (HOSPITAL_COMMUNITY)
Admission: RE | Admit: 2024-05-04 | Discharge: 2024-05-04 | Disposition: A | Discharge status: Home / Self Care | Source: Ambulatory Visit | Attending: Cardiovascular Disease | Admitting: Cardiovascular Disease

## 2024-05-04 DIAGNOSIS — I10 Essential (primary) hypertension: Secondary | ICD-10-CM | POA: Diagnosis present

## 2024-05-04 DIAGNOSIS — I7121 Aneurysm of the ascending aorta, without rupture: Secondary | ICD-10-CM | POA: Diagnosis present

## 2024-05-04 LAB — BASIC METABOLIC PANEL WITH GFR
BUN/Creatinine Ratio: 14 (ref 10–24)
BUN: 19 mg/dL (ref 8–27)
CO2: 20 mmol/L (ref 20–29)
Calcium: 9.7 mg/dL (ref 8.6–10.2)
Chloride: 99 mmol/L (ref 96–106)
Creatinine, Ser: 1.33 mg/dL — ABNORMAL HIGH (ref 0.76–1.27)
Glucose: 78 mg/dL (ref 70–99)
Potassium: 4.2 mmol/L (ref 3.5–5.2)
Sodium: 133 mmol/L — ABNORMAL LOW (ref 134–144)
eGFR: 56 mL/min/1.73 — ABNORMAL LOW

## 2024-05-04 MED ORDER — IOHEXOL 350 MG/ML SOLN
75.0000 mL | Freq: Once | INTRAVENOUS | Status: AC | PRN
  Administered 2024-05-04: 75 mL via INTRAVENOUS

## 2024-05-22 ENCOUNTER — Ambulatory Visit (HOSPITAL_COMMUNITY)
Admission: RE | Admit: 2024-05-22 | Discharge: 2024-05-22 | Disposition: A | Discharge status: Home / Self Care | Source: Ambulatory Visit | Attending: Cardiology | Admitting: Cardiology

## 2024-05-22 DIAGNOSIS — I350 Nonrheumatic aortic (valve) stenosis: Secondary | ICD-10-CM

## 2024-05-22 DIAGNOSIS — N189 Chronic kidney disease, unspecified: Secondary | ICD-10-CM | POA: Insufficient documentation

## 2024-05-22 DIAGNOSIS — Z87891 Personal history of nicotine dependence: Secondary | ICD-10-CM | POA: Diagnosis not present

## 2024-05-22 DIAGNOSIS — E785 Hyperlipidemia, unspecified: Secondary | ICD-10-CM | POA: Insufficient documentation

## 2024-05-22 DIAGNOSIS — I2510 Atherosclerotic heart disease of native coronary artery without angina pectoris: Secondary | ICD-10-CM | POA: Diagnosis not present

## 2024-05-22 DIAGNOSIS — I509 Heart failure, unspecified: Secondary | ICD-10-CM | POA: Diagnosis not present

## 2024-05-22 DIAGNOSIS — I77819 Aortic ectasia, unspecified site: Secondary | ICD-10-CM

## 2024-05-22 DIAGNOSIS — I130 Hypertensive heart and chronic kidney disease with heart failure and stage 1 through stage 4 chronic kidney disease, or unspecified chronic kidney disease: Secondary | ICD-10-CM | POA: Diagnosis not present

## 2024-05-22 DIAGNOSIS — I77810 Thoracic aortic ectasia: Secondary | ICD-10-CM | POA: Diagnosis not present

## 2024-05-22 DIAGNOSIS — G459 Transient cerebral ischemic attack, unspecified: Secondary | ICD-10-CM | POA: Diagnosis not present

## 2024-05-22 LAB — ECHOCARDIOGRAM COMPLETE: S' Lateral: 2.95 cm
